# Patient Record
Sex: Female | Born: 1970 | Race: Black or African American | Hispanic: No | Marital: Single | Smoking: Former smoker
Health system: Southern US, Community
[De-identification: ages and names within clinical notes are randomized; demographics above are authoritative.]

## PROBLEM LIST (undated history)

## (undated) DIAGNOSIS — Z87440 Personal history of urinary (tract) infections: Secondary | ICD-10-CM

## (undated) DIAGNOSIS — F419 Anxiety disorder, unspecified: Secondary | ICD-10-CM

## (undated) DIAGNOSIS — D649 Anemia, unspecified: Secondary | ICD-10-CM

## (undated) DIAGNOSIS — E049 Nontoxic goiter, unspecified: Secondary | ICD-10-CM

## (undated) DIAGNOSIS — M549 Dorsalgia, unspecified: Principal | ICD-10-CM

## (undated) DIAGNOSIS — H5440 Blindness, one eye, unspecified eye: Secondary | ICD-10-CM

## (undated) DIAGNOSIS — C49A2 Gastrointestinal stromal tumor of stomach: Secondary | ICD-10-CM

## (undated) DIAGNOSIS — F329 Major depressive disorder, single episode, unspecified: Secondary | ICD-10-CM

## (undated) DIAGNOSIS — Z9289 Personal history of other medical treatment: Secondary | ICD-10-CM

## (undated) DIAGNOSIS — M1990 Unspecified osteoarthritis, unspecified site: Secondary | ICD-10-CM

## (undated) DIAGNOSIS — I10 Essential (primary) hypertension: Secondary | ICD-10-CM

## (undated) DIAGNOSIS — Z973 Presence of spectacles and contact lenses: Secondary | ICD-10-CM

## (undated) DIAGNOSIS — G8929 Other chronic pain: Secondary | ICD-10-CM

## (undated) DIAGNOSIS — C49A4 Gastrointestinal stromal tumor of large intestine: Secondary | ICD-10-CM

## (undated) DIAGNOSIS — F17200 Nicotine dependence, unspecified, uncomplicated: Secondary | ICD-10-CM

## (undated) DIAGNOSIS — F32A Depression, unspecified: Secondary | ICD-10-CM

## (undated) DIAGNOSIS — D259 Leiomyoma of uterus, unspecified: Secondary | ICD-10-CM

## (undated) DIAGNOSIS — M79606 Pain in leg, unspecified: Secondary | ICD-10-CM

## (undated) HISTORY — DX: Pain in leg, unspecified: M79.606

## (undated) HISTORY — DX: Depression, unspecified: F32.A

## (undated) HISTORY — DX: Other chronic pain: G89.29

## (undated) HISTORY — DX: Nicotine dependence, unspecified, uncomplicated: F17.200

## (undated) HISTORY — DX: Dorsalgia, unspecified: M54.9

## (undated) HISTORY — DX: Personal history of other medical treatment: Z92.89

## (undated) HISTORY — PX: MYOMECTOMY: SHX85

## (undated) HISTORY — DX: Major depressive disorder, single episode, unspecified: F32.9

## (undated) HISTORY — PX: DIAGNOSTIC LAPAROSCOPY: SUR761

## (undated) HISTORY — DX: Gastrointestinal stromal tumor of large intestine: C49.A4

## (undated) HISTORY — PX: OTHER SURGICAL HISTORY: SHX169

## (undated) HISTORY — DX: Presence of spectacles and contact lenses: Z97.3

## (undated) HISTORY — DX: Anxiety disorder, unspecified: F41.9

## (undated) HISTORY — PX: TUBAL LIGATION: SHX77

## (undated) HISTORY — DX: Personal history of urinary (tract) infections: Z87.440

## (undated) HISTORY — DX: Leiomyoma of uterus, unspecified: D25.9

## (undated) HISTORY — DX: Anemia, unspecified: D64.9

---
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
## 2004-10-05 HISTORY — PX: HERNIA REPAIR: SHX51

## 2008-10-05 DIAGNOSIS — C49A4 Gastrointestinal stromal tumor of large intestine: Secondary | ICD-10-CM

## 2008-10-05 HISTORY — DX: Gastrointestinal stromal tumor of large intestine: C49.A4

## 2008-10-05 HISTORY — PX: TUMOR REMOVAL: SHX12

## 2010-09-16 ENCOUNTER — Emergency Department (HOSPITAL_COMMUNITY)
Admission: EM | Admit: 2010-09-16 | Discharge: 2010-09-16 | Payer: Self-pay | Source: Home / Self Care | Admitting: Emergency Medicine

## 2010-12-16 LAB — URINALYSIS, ROUTINE W REFLEX MICROSCOPIC
Bilirubin Urine: NEGATIVE
Ketones, ur: NEGATIVE mg/dL
Nitrite: POSITIVE — AB
Protein, ur: 100 mg/dL — AB
Urobilinogen, UA: 2 mg/dL — ABNORMAL HIGH (ref 0.0–1.0)
pH: 7 (ref 5.0–8.0)

## 2011-01-01 ENCOUNTER — Encounter: Payer: Self-pay | Admitting: Oncology

## 2011-01-20 ENCOUNTER — Encounter (HOSPITAL_BASED_OUTPATIENT_CLINIC_OR_DEPARTMENT_OTHER): Payer: Medicaid Other | Admitting: Oncology

## 2011-01-20 ENCOUNTER — Other Ambulatory Visit: Payer: Self-pay | Admitting: Oncology

## 2011-01-20 DIAGNOSIS — C169 Malignant neoplasm of stomach, unspecified: Secondary | ICD-10-CM

## 2011-01-20 DIAGNOSIS — I10 Essential (primary) hypertension: Secondary | ICD-10-CM

## 2011-01-20 DIAGNOSIS — C494 Malignant neoplasm of connective and soft tissue of abdomen: Secondary | ICD-10-CM

## 2011-01-20 DIAGNOSIS — R51 Headache: Secondary | ICD-10-CM

## 2011-01-20 DIAGNOSIS — D649 Anemia, unspecified: Secondary | ICD-10-CM

## 2011-01-20 LAB — CBC WITH DIFFERENTIAL/PLATELET
Basophils Absolute: 0 10*3/uL (ref 0.0–0.1)
Eosinophils Absolute: 0.3 10*3/uL (ref 0.0–0.5)
HCT: 32.6 % — ABNORMAL LOW (ref 34.8–46.6)
HGB: 10.9 g/dL — ABNORMAL LOW (ref 11.6–15.9)
LYMPH%: 32.9 % (ref 14.0–49.7)
MCV: 83.3 fL (ref 79.5–101.0)
MONO#: 0.4 10*3/uL (ref 0.1–0.9)
MONO%: 6.5 % (ref 0.0–14.0)
NEUT#: 3.2 10*3/uL (ref 1.5–6.5)
Platelets: 208 10*3/uL (ref 145–400)
RBC: 3.91 10*6/uL (ref 3.70–5.45)
WBC: 5.9 10*3/uL (ref 3.9–10.3)

## 2011-01-20 LAB — CHCC SMEAR

## 2011-01-21 LAB — COMPREHENSIVE METABOLIC PANEL
ALT: 11 U/L (ref 0–35)
CO2: 23 mEq/L (ref 19–32)
Calcium: 8.6 mg/dL (ref 8.4–10.5)
Chloride: 107 mEq/L (ref 96–112)
Creatinine, Ser: 0.77 mg/dL (ref 0.40–1.20)
Glucose, Bld: 120 mg/dL — ABNORMAL HIGH (ref 70–99)
Sodium: 139 mEq/L (ref 135–145)
Total Bilirubin: 0.4 mg/dL (ref 0.3–1.2)
Total Protein: 6.3 g/dL (ref 6.0–8.3)

## 2011-01-21 LAB — LACTATE DEHYDROGENASE: LDH: 154 U/L (ref 94–250)

## 2011-01-21 LAB — FERRITIN: Ferritin: 6 ng/mL — ABNORMAL LOW (ref 10–291)

## 2011-02-03 ENCOUNTER — Ambulatory Visit (HOSPITAL_COMMUNITY)
Admission: RE | Admit: 2011-02-03 | Discharge: 2011-02-03 | Disposition: A | Discharge status: Home / Self Care | Payer: Self-pay | Source: Ambulatory Visit | Attending: Oncology | Admitting: Oncology

## 2011-02-03 ENCOUNTER — Other Ambulatory Visit: Payer: Self-pay | Admitting: Oncology

## 2011-02-03 DIAGNOSIS — Z9221 Personal history of antineoplastic chemotherapy: Secondary | ICD-10-CM | POA: Insufficient documentation

## 2011-02-03 DIAGNOSIS — C169 Malignant neoplasm of stomach, unspecified: Secondary | ICD-10-CM

## 2011-02-03 DIAGNOSIS — D259 Leiomyoma of uterus, unspecified: Secondary | ICD-10-CM | POA: Insufficient documentation

## 2011-02-03 DIAGNOSIS — E042 Nontoxic multinodular goiter: Secondary | ICD-10-CM | POA: Insufficient documentation

## 2011-02-03 DIAGNOSIS — Z8500 Personal history of malignant neoplasm of unspecified digestive organ: Secondary | ICD-10-CM | POA: Insufficient documentation

## 2011-02-03 MED ORDER — IOHEXOL 300 MG/ML  SOLN
100.0000 mL | Freq: Once | INTRAMUSCULAR | Status: AC | PRN
  Administered 2011-02-03: 100 mL via INTRAVENOUS

## 2011-02-05 ENCOUNTER — Encounter (HOSPITAL_BASED_OUTPATIENT_CLINIC_OR_DEPARTMENT_OTHER): Payer: Self-pay | Admitting: Oncology

## 2011-02-05 DIAGNOSIS — D481 Neoplasm of uncertain behavior of connective and other soft tissue: Secondary | ICD-10-CM

## 2011-02-05 DIAGNOSIS — D509 Iron deficiency anemia, unspecified: Secondary | ICD-10-CM

## 2011-02-12 ENCOUNTER — Encounter: Payer: Self-pay | Admitting: Oncology

## 2011-03-04 ENCOUNTER — Encounter: Payer: Self-pay | Admitting: Obstetrics & Gynecology

## 2011-03-31 ENCOUNTER — Encounter (HOSPITAL_BASED_OUTPATIENT_CLINIC_OR_DEPARTMENT_OTHER): Payer: 59 | Admitting: Oncology

## 2011-03-31 ENCOUNTER — Other Ambulatory Visit: Payer: Self-pay | Admitting: Oncology

## 2011-03-31 DIAGNOSIS — R51 Headache: Secondary | ICD-10-CM

## 2011-03-31 DIAGNOSIS — I10 Essential (primary) hypertension: Secondary | ICD-10-CM

## 2011-03-31 DIAGNOSIS — D649 Anemia, unspecified: Secondary | ICD-10-CM

## 2011-03-31 DIAGNOSIS — C169 Malignant neoplasm of stomach, unspecified: Secondary | ICD-10-CM

## 2011-03-31 LAB — CBC WITH DIFFERENTIAL/PLATELET
BASO%: 0.6 % (ref 0.0–2.0)
EOS%: 1.4 % (ref 0.0–7.0)
LYMPH%: 37.3 % (ref 14.0–49.7)
MCHC: 31.6 g/dL (ref 31.5–36.0)
MCV: 77.2 fL — ABNORMAL LOW (ref 79.5–101.0)
MONO%: 6.9 % (ref 0.0–14.0)
Platelets: 214 10*3/uL (ref 145–400)
RBC: 4.34 10*6/uL (ref 3.70–5.45)
RDW: 16.3 % — ABNORMAL HIGH (ref 11.2–14.5)
nRBC: 0 % (ref 0–0)

## 2011-03-31 LAB — BASIC METABOLIC PANEL
Calcium: 9.3 mg/dL (ref 8.4–10.5)
Creatinine, Ser: 0.79 mg/dL (ref 0.50–1.10)
Sodium: 140 mEq/L (ref 135–145)

## 2011-04-13 ENCOUNTER — Inpatient Hospital Stay (INDEPENDENT_AMBULATORY_CARE_PROVIDER_SITE_OTHER)
Admission: RE | Admit: 2011-04-13 | Discharge: 2011-04-13 | Disposition: A | Discharge status: Home / Self Care | Payer: 59 | Source: Ambulatory Visit | Attending: Family Medicine | Admitting: Family Medicine

## 2011-04-13 DIAGNOSIS — B9689 Other specified bacterial agents as the cause of diseases classified elsewhere: Secondary | ICD-10-CM

## 2011-04-13 DIAGNOSIS — A499 Bacterial infection, unspecified: Secondary | ICD-10-CM

## 2011-04-13 DIAGNOSIS — N760 Acute vaginitis: Secondary | ICD-10-CM

## 2011-04-13 DIAGNOSIS — L738 Other specified follicular disorders: Secondary | ICD-10-CM

## 2011-04-13 LAB — POCT URINALYSIS DIP (DEVICE)
Glucose, UA: NEGATIVE mg/dL
Leukocytes, UA: NEGATIVE
Nitrite: NEGATIVE
pH: 6 (ref 5.0–8.0)

## 2011-04-13 LAB — WET PREP, GENITAL
Trich, Wet Prep: NONE SEEN
Yeast Wet Prep HPF POC: NONE SEEN

## 2011-04-13 LAB — POCT PREGNANCY, URINE: Preg Test, Ur: NEGATIVE

## 2011-04-15 LAB — HERPES SIMPLEX VIRUS CULTURE

## 2012-03-10 ENCOUNTER — Encounter (HOSPITAL_COMMUNITY): Payer: Self-pay | Admitting: Emergency Medicine

## 2012-03-10 ENCOUNTER — Emergency Department (INDEPENDENT_AMBULATORY_CARE_PROVIDER_SITE_OTHER)
Admission: EM | Admit: 2012-03-10 | Discharge: 2012-03-10 | Disposition: A | Discharge status: Nursing Home (Includes ICF) | Payer: 59 | Source: Home / Self Care | Attending: Emergency Medicine | Admitting: Emergency Medicine

## 2012-03-10 ENCOUNTER — Encounter (HOSPITAL_COMMUNITY): Payer: Self-pay | Admitting: *Deleted

## 2012-03-10 ENCOUNTER — Emergency Department (HOSPITAL_COMMUNITY)
Admission: EM | Admit: 2012-03-10 | Discharge: 2012-03-10 | Disposition: A | Discharge status: Home / Self Care | Payer: Self-pay | Attending: Emergency Medicine | Admitting: Emergency Medicine

## 2012-03-10 DIAGNOSIS — I10 Essential (primary) hypertension: Secondary | ICD-10-CM | POA: Insufficient documentation

## 2012-03-10 DIAGNOSIS — F17200 Nicotine dependence, unspecified, uncomplicated: Secondary | ICD-10-CM | POA: Insufficient documentation

## 2012-03-10 DIAGNOSIS — I160 Hypertensive urgency: Secondary | ICD-10-CM

## 2012-03-10 HISTORY — DX: Blindness, one eye, unspecified eye: H54.40

## 2012-03-10 HISTORY — DX: Essential (primary) hypertension: I10

## 2012-03-10 HISTORY — DX: Gastrointestinal stromal tumor of stomach: C49.A2

## 2012-03-10 MED ORDER — LISINOPRIL-HYDROCHLOROTHIAZIDE 20-12.5 MG PO TABS: 1.0000 | ORAL_TABLET | Freq: Every day | ORAL | Status: DC

## 2012-03-10 MED ORDER — LISINOPRIL 10 MG PO TABS: 10.0000 mg | ORAL_TABLET | Freq: Once | ORAL | Status: DC

## 2012-03-10 MED ORDER — HYDROCHLOROTHIAZIDE 25 MG PO TABS
25.0000 mg | ORAL_TABLET | Freq: Once | ORAL | Status: AC
  Administered 2012-03-10: 25 mg via ORAL
  Filled 2012-03-10: qty 1

## 2012-03-10 MED ORDER — LISINOPRIL 20 MG PO TABS
20.0000 mg | ORAL_TABLET | Freq: Once | ORAL | Status: AC
  Administered 2012-03-10: 20 mg via ORAL
  Filled 2012-03-10: qty 1

## 2012-03-10 NOTE — Discharge Instructions (Signed)
Hypertension Information As your heart beats, it forces blood through your arteries. This force is your blood pressure. If the pressure is too high, it is called hypertension (HTN) or high blood pressure. HTN is dangerous because you may have it and not know it. High blood pressure may mean that your heart has to work harder to pump blood. Your arteries may be narrow or stiff. The extra work puts you at risk for heart disease, stroke, and other problems.  Blood pressure consists of two numbers, a higher number over a lower, 110/72, for example. It is stated as "110 over 72." The ideal is below 120 for the top number (systolic) and under 80 for the bottom (diastolic).  You should pay close attention to your blood pressure if you have certain conditions such as:  Heart failure.   Prior heart attack.   Diabetes   Chronic kidney disease.   Prior stroke.   Multiple risk factors for heart disease.  To see if you have HTN, your blood pressure should be measured while you are seated with your arm held at the level of the heart. It should be measured at least twice. A one-time elevated blood pressure reading (especially in the Emergency Department) does not mean that you need treatment. There may be conditions in which the blood pressure is different between your right and left arms. It is important to see your caregiver soon for a recheck. Most people have essential hypertension which means that there is not a specific cause. This type of high blood pressure may be lowered by changing lifestyle factors such as:  Stress.   Smoking.   Lack of exercise.   Excessive weight.   Drug/tobacco/alcohol use.   Eating less salt.  Most people do not have symptoms from high blood pressure until it has caused damage to the body. Effective treatment can often prevent, delay or reduce that damage. TREATMENT  Treatment for high blood pressure, when a cause has been identified, is directed at the cause. There  are a large number of medications to treat HTN. These fall into several categories, and your caregiver will help you select the medicines that are best for you. Medications may have side effects. You should review side effects with your caregiver. If your blood pressure stays high after you have made lifestyle changes or started on medicines,   Your medication(s) may need to be changed.   Other problems may need to be addressed.   Be certain you understand your prescriptions, and know how and when to take your medicine.   Be sure to follow up with your caregiver within the time frame advised (usually within two weeks) to have your blood pressure rechecked and to review your medications.   If you are taking more than one medicine to lower your blood pressure, make sure you know how and at what times they should be taken. Taking two medicines at the same time can result in blood pressure that is too low.  Document Released: 11/24/2005 Document Revised: 06/03/2011 Document Reviewed: 12/01/2007 [facility] Patient Information 2012 [place].

## 2012-03-10 NOTE — ED Notes (Signed)
Pharmacy notified re: meds

## 2012-03-10 NOTE — ED Notes (Signed)
Pt sent here from Urgent Care for elevated bp (174/125).  Pt states she went to Urgent Care for a persistent headache and nausea.  Pt was dx with hypertension since 2003.  She does not have a pcp and she ran out of her refills 1 month prior (she had been given an RX at urgent care).  Presently pt c/o a pressure headadche (frontal).

## 2012-03-10 NOTE — ED Provider Notes (Signed)
History     CSN: [id number]  Arrival date & time 03/10/12  1903   First MD Initiated Contact with Patient 03/10/12 1905      Chief Complaint  Patient presents with  . Hypertension    (Consider location/radiation/quality/duration/timing/severity/associated sxs/prior treatment) HPI Comments: Patient with a history of hypertension, has not been on blood pressure medications in 4 months reports severe, acute onset headache with blurry vision, nausea, vomiting lasting for approximately 12 hours yesterday while at rest.  The blurry vision was primarily in her right eye. 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 There were no aggravating factors when having her symptoms yesterday. Her headache and blurry vision have since resolved after sleeping. No photophobia, neck stiffness. No dysarthria, facial droop, focal weakness. No syncope, or seizures. No chest pain, palpitations, shortness of breath, abdominal pain. No anuria or hematuria. No lower extremity swelling. Denies illicit drug use, decongestions, excessive caffeine. She was on hydrochlorothiazide once daily, which she states did not work.  ROS as noted in HPI. All other ROS negative.   Patient is a 41 y.o. female presenting with hypertension and headaches. The history is provided by the patient. No language interpreter was used.  Hypertension This is a chronic problem. The current episode started more than 1 week ago. The problem occurs constantly. The problem has not changed since onset.Associated symptoms include headaches. Pertinent negatives include no chest pain, no abdominal pain and no shortness of breath. The symptoms are aggravated by nothing. The symptoms are relieved by nothing. She has tried nothing for the symptoms. The treatment provided no relief.  Headache The primary symptoms include headaches and  visual change. Primary symptoms do not include syncope, loss of consciousness, seizures, focal weakness, loss of sensation, speech change, fever, nausea or vomiting. The symptoms began yesterday. The symptoms are resolved. The neurological symptoms are diffuse.  The headache is associated with visual change. The headache is not associated with photophobia, neck stiffness or loss of balance.  The visual change does not include photophobia.  Additional symptoms do not include neck stiffness, loss of balance or photophobia. Medical issues also include cancer and hypertension. Medical issues do not include cerebral vascular accident, drug use or diabetes.    Past Medical History  Diagnosis Date  . Hypertension   . Blindness of right eye     congenital  . 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 Tumor removal   . Tubal ligation     was reversed    History reviewed. No pertinent family history.  History  Substance Use Topics  . Smoking status: Current Everyday Smoker  . Smokeless tobacco: Not on file  . Alcohol Use: Yes    OB History    Grav Para Term Preterm Abortions TAB SAB Ect Mult Living                  Review of Systems  Constitutional: Negative for fever.  HENT: Negative for neck stiffness.   Eyes: Negative for photophobia.  Respiratory: Negative for shortness of breath.   Cardiovascular: Negative for chest pain and syncope.  Gastrointestinal: Negative for nausea, vomiting and abdominal pain.  Neurological: Positive for headaches. Negative for speech change, focal weakness, seizures, loss of consciousness and loss  of balance.    Allergies  Review of patient's allergies indicates no known allergies.  Home Medications   Current Outpatient Rx  Name Route Sig Dispense Refill  . HYDROCHLOROTHIAZIDE 25 MG PO TABS Oral Take 25 mg by mouth daily.      BP 186/127  Pulse 70  Temp(Src) 99.2 F (37.3  C) (Oral)  Resp 20  SpO2 100%  LMP 02/10/2012  Physical Exam  Nursing note and vitals reviewed. Constitutional: She is oriented to person, place, and time. She appears well-developed and well-nourished. She appears distressed.       Appears uncomfortable. Photophobic. Lying down with hat pulled over eyes .  HENT:  Head: Normocephalic and atraumatic.  Right Ear: Tympanic membrane normal.  Left Ear: Tympanic membrane normal.  Nose: Nose normal. Right sinus exhibits no maxillary sinus tenderness and no frontal sinus tenderness. Left sinus exhibits no maxillary sinus tenderness and no frontal sinus tenderness.  Mouth/Throat: Uvula is midline, oropharynx is clear and moist and mucous membranes are normal.  Eyes: Conjunctivae and EOM are normal. Pupils are equal, round, and reactive to light.  Fundoscopic exam:      The right eye shows no hemorrhage.       The left eye shows no hemorrhage.       Nondilated  funduscopic exam limited  Neck: Normal range of motion. Neck supple. Normal range of motion present.  Cardiovascular: Normal rate, regular rhythm, normal heart sounds and intact distal pulses.   Pulmonary/Chest: Effort normal and breath sounds normal.  Abdominal: Soft. Bowel sounds are normal. She exhibits no distension.  Musculoskeletal: Normal range of motion. She exhibits no edema.  Lymphadenopathy:    She has no cervical adenopathy.  Neurological: She is alert and oriented to person, place, and time. She has normal strength. She displays no tremor. No cranial nerve deficit or sensory deficit. Coordination and gait normal.  Skin: Skin is warm and dry.  Psychiatric: She has a normal mood and affect. Her behavior is normal. Judgment and thought content normal.    ED Course  Procedures (including critical care time)  Labs Reviewed - No data to display No results found.   1. Hypertensive urgency    EKG: Sinus bradycardia, rate 59. Normal axis, normal intervals. LVH. No previous  EKG for comparison.   MDM   She is neurologically intact here, and currently has no complaints. EKG is unremarkable. Blood pressure is 186/127. Concern for hypertensive urgency given her symptoms yesterday. Transferring the ED for further evaluation.  Cherly Beach, MD 03/10/12 2116

## 2012-03-10 NOTE — ED Notes (Addendum)
PT HERE WITH C/O FRONTAL THRO HA/ RADIATING TO POSTERIOR WITH DIZZINESS AND X 1 EPISODE OF VOMITING.HX HTN AND HAS NOT TAKEN MEDS X 4 MNTHS AGO.TRIED ALEVE BUT NO RELIEF.BP 180'S/127.SX STARTED YESTERDAY

## 2012-03-10 NOTE — ED Provider Notes (Signed)
History     CSN: [id number]  Arrival date & time 03/10/12  2103   First MD Initiated Contact with Patient 03/10/12 2211      No chief complaint on file.   (Consider location/radiation/quality/duration/timing/severity/associated sxs/prior treatment) HPI Comments: Patient presents with history of hypertension.  She had headache yesterday and just felt tired.  Today when she still had some mild headache she went to urgent care where they evaluated her and sent her here for further evaluation for hypertension.  Patient notes that she has been off of her medications for many months.  She does not have a primary care physician currently.  She notes that she'll his mild headache at this time in no nausea, vomiting, chest pain, shortness of breath, abdominal pain or other complaints.  No weakness, numbness or other vision changes.  Patient had one episode of nausea and vomiting yesterday which has since resolved.    The history is provided by the patient. No language interpreter was used.    Past Medical History  Diagnosis Date  . Hypertension   . 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 Blindness of left eye     congenital    Past Surgical History  Procedure Date  . Tumor removal 2010    stromo tumor -  half of stomach removed  . Tubal ligation     was reversed    History reviewed. No pertinent family history.  History  Substance Use Topics  . Smoking status: Current Everyday Smoker -- 1.0 packs/day    Types: Cigarettes  . Smokeless tobacco: Not on file  . Alcohol Use: Yes    OB History    Grav Para Term Preterm Abortions TAB SAB Ect Mult Living                  Review of Systems  Constitutional: Negative.  Negative for fever and chills.  Eyes: Negative.  Negative for discharge and redness.  Respiratory: Negative.  Negative for cough and shortness of breath.   Cardiovascular: Negative.  Negative for chest pain.  Gastrointestinal: Positive for  nausea and vomiting. Negative for abdominal pain and diarrhea.  Genitourinary: Negative.   Musculoskeletal: Negative.  Negative for back pain.  Skin: Negative.  Negative for color change and rash.  Neurological: Positive for headaches. Negative for syncope.  Hematological: Negative.  Negative for adenopathy.  Psychiatric/Behavioral: Negative.  Negative for confusion.  All other systems reviewed and are negative.    Allergies  Review of patient's allergies indicates no known allergies.  Home Medications  No current outpatient prescriptions on file.  BP 150/112  Pulse 55  Temp(Src) 98.2 F (36.8 C) (Oral)  Resp 18  SpO2 100%  LMP 02/10/2012  Physical Exam  Nursing note and vitals reviewed. Constitutional: She is oriented to person, place, and time. She appears well-developed and well-nourished.  Non-toxic appearance. She does not have a sickly appearance.  HENT:  Head: Normocephalic and atraumatic.  Eyes: Conjunctivae, EOM and lids are normal. No scleral icterus.  Neck: Trachea normal and normal range of motion. Neck supple.  Cardiovascular: Normal rate, regular rhythm and normal heart sounds.   Pulmonary/Chest: Effort normal and breath sounds normal. No respiratory distress. She has no wheezes. She has no rales.  Abdominal: Soft. Normal appearance. There is no tenderness. There is no rebound, no guarding and no CVA tenderness.  Musculoskeletal: Normal range of motion.  Neurological: She is alert and oriented  to person, place, and time. She has normal strength.  Skin: Skin is warm, dry and intact. No rash noted.  Psychiatric: She has a normal mood and affect. Her behavior is normal. Judgment and thought content normal.    ED Course  Procedures (including critical care time)  Labs Reviewed - No data to display No results found.   No diagnosis found.    MDM  Patient with prior diagnosis of hypertension not on   any medications for the last several months.  Patient  does not have medical insurance so she has not been seen her primary care physician.  At this point in time patient has no acute neurologic deficits.  Her headache is mild at this time.  She has no chest pain, shortness of breath or clinical signs of decreased urination.  Patient needs to be restarted on blood pressure medications and I will start her on HCTZ and lisinopril.  She has had a tubal ligation so pregnancy is not a concern at this time with the ACE inhibitor.  I've advised her that she will need followup in 3-4 weeks for blood pressure recheck.  We'll give her the resource guide to help her accomplish this.       Lezlie Octave, MD 03/10/12 2224

## 2012-03-10 NOTE — ED Notes (Signed)
Called for pt with no response.

## 2012-06-27 ENCOUNTER — Emergency Department (HOSPITAL_COMMUNITY)
Admission: EM | Admit: 2012-06-27 | Discharge: 2012-06-28 | Disposition: A | Discharge status: Home / Self Care | Payer: Self-pay | Attending: Emergency Medicine | Admitting: Emergency Medicine

## 2012-06-27 ENCOUNTER — Encounter (HOSPITAL_COMMUNITY): Payer: Self-pay | Admitting: Emergency Medicine

## 2012-06-27 DIAGNOSIS — B86 Scabies: Secondary | ICD-10-CM

## 2012-06-27 DIAGNOSIS — R21 Rash and other nonspecific skin eruption: Secondary | ICD-10-CM | POA: Insufficient documentation

## 2012-06-27 DIAGNOSIS — F17200 Nicotine dependence, unspecified, uncomplicated: Secondary | ICD-10-CM | POA: Insufficient documentation

## 2012-06-27 DIAGNOSIS — I10 Essential (primary) hypertension: Secondary | ICD-10-CM | POA: Insufficient documentation

## 2012-06-27 NOTE — ED Provider Notes (Signed)
History  This chart was scribed for No att. providers found by [facility]. This patient was seen in room TR04C/TR04C and the patient's care was started at 23:30.  CSN: [id number]  Arrival date & time 06/27/12  2238   None     Chief Complaint  Patient presents with  . Rash     The history is provided by the patient. No language interpreter was used.    Sydney [name] is a 41 y.o. female who presents to the Emergency Department complaining of a rash on the arms, back of neck and lower abdomen. She states it feels like something is biting her, with the rash coming and going. She states that the rash started last week, but denies new detergent, food or medications. She also denies fever, chills, nausea and emesis. She states that her boyfriend has similar complaints. She states that it is not bed bugs, that she has checked her bed and washed all sheets and blankets. She has a h/o HTN and stromal tumor in the stomach. She is a current everyday smoker and occasional alcohol user.   Past Medical History  Diagnosis Date  . Hypertension   . 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 Blindness of left eye     congenital    Past Surgical History  Procedure Date  . Tumor removal 2010    stromo tumor -  half of stomach removed  . Tubal ligation     was reversed    History reviewed. No pertinent family history.  History  Substance Use Topics  . Smoking status: Current Every Day Smoker -- 1.0 packs/day    Types: Cigarettes  . Smokeless tobacco: Not on file  . Alcohol Use: Yes   No OB history available.    Review of Systems  A complete 10 system review of systems was obtained and all systems are negative except as noted in the HPI and PMH.    Allergies  Review of patient's allergies indicates no known allergies.  Home Medications   Current Outpatient Rx  Name Route Sig Dispense Refill  . VALSARTAN 160 MG PO TABS Oral Take 160 mg by mouth  daily.      Triage Vitals: BP 138/97  Pulse 81  Temp 98.1 F (36.7 C) (Oral)  Resp 19  SpO2 98%  LMP 06/04/2012  Physical Exam  Nursing note and vitals reviewed. Constitutional: She is oriented to person, place, and time. She appears well-developed and well-nourished. No distress.  HENT:  Head: Normocephalic and atraumatic.  Eyes: EOM are normal.  Neck: Neck supple. No tracheal deviation present.  Cardiovascular: Normal rate, regular rhythm and normal heart sounds.   Pulmonary/Chest: Effort normal and breath sounds normal. No respiratory distress.  Musculoskeletal: Normal range of motion.  Neurological: She is alert and oriented to person, place, and time.  Skin: Skin is warm and dry. Rash noted.       Tiny papular lesions, erythematous, located in the web space on the left hand, on the extensor surface of the wrist on RUE and LUE.   Psychiatric: She has a normal mood and affect. Her behavior is normal.    ED Course  Procedures (including critical care time) DIAGNOSTIC STUDIES: Oxygen Saturation is 98% on room air, normal by my interpretation.    COORDINATION OF CARE: 2350-Discussed treatment plan with pt at bedside and pt agreed to plan.    Labs Reviewed - No data to  display No results found.   No diagnosis found.    MDM  Medical screening examination/treatment/procedure(s) were performed by me as the supervising physician. Scribe service was utilized for documentation only.  Pt comes in with cc of rash. Pt has some tiny macular raised lesion, diffuse - but they are present over the web space, and over the joint space. Clinical concern is for scabies - although i recognize that pt is not the typical age group for scabies. Will give the meds, and d.c.      Varney Biles, MD 06/28/12 [phone]

## 2012-06-27 NOTE — ED Notes (Signed)
Patient says she has been itching around her wrists, her belly button, elbows and under her arms.  She says she has noticed "black dots" in the places where she is itching.  Patient has had the symptoms about a week and a half.  Patient says her boyfriend has been complaining of the same symptoms for about a month.  Patient thought it might be bed bugs, but then she did more research and found out she had symptoms of scabies.

## 2012-06-27 NOTE — ED Notes (Signed)
PT. REPORTS ITCHY RASHES AT ARMS , BACK OF NECK AND LOWER ABDOMEN FOR 2 WEEKS , RESPIRATIONS UNLABORED , AIRWAY PATENT , DENIES NEW DETERGENT , NEW SOAP , NEW FOOD OR MEDICATIONS .

## 2012-06-28 MED ORDER — PERMETHRIN 5 % EX CREA: TOPICAL_CREAM | Freq: Once | CUTANEOUS | Status: DC

## 2012-06-28 MED ORDER — DIPHENHYDRAMINE HCL 25 MG PO CAPS: 25.0000 mg | ORAL_CAPSULE | Freq: Four times a day (QID) | ORAL | Status: DC | PRN

## 2012-06-28 NOTE — ED Notes (Signed)
Patient is alert and orientedx4.  Patient was explained discharge instructions and she had no questions.  Patient is driving herself home.

## 2012-07-06 ENCOUNTER — Encounter (HOSPITAL_COMMUNITY): Payer: Self-pay

## 2012-07-06 ENCOUNTER — Emergency Department (INDEPENDENT_AMBULATORY_CARE_PROVIDER_SITE_OTHER)
Admission: EM | Admit: 2012-07-06 | Discharge: 2012-07-06 | Disposition: A | Discharge status: Home / Self Care | Payer: Self-pay | Source: Home / Self Care

## 2012-07-06 DIAGNOSIS — R21 Rash and other nonspecific skin eruption: Secondary | ICD-10-CM

## 2012-07-06 MED ORDER — CETIRIZINE HCL 10 MG PO TABS: 10.0000 mg | ORAL_TABLET | Freq: Every day | ORAL | Status: DC

## 2012-07-06 MED ORDER — TRIAMCINOLONE ACETONIDE 0.1 % EX CREA: TOPICAL_CREAM | Freq: Two times a day (BID) | CUTANEOUS | Status: DC

## 2012-07-06 NOTE — ED Provider Notes (Signed)
History     CSN: [id number]  Arrival date & time 07/06/12  1742   None     Chief Complaint  Patient presents with  . Rash    (Consider location/radiation/quality/duration/timing/severity/associated sxs/prior treatment) Patient is a 41 y.o. female presenting with rash. The history is provided by the patient.  Rash   This patient complains of a pruritic rash.  Location: bilateral arms and left leg  Onset: 1-2 wk ago   Course: improved Self-treated with: elimite cream prescribed by [facility] provider         Improvement with treatment: some  History Itching: yes  Tenderness: no  New medications/antibiotics: no  Pet exposure: no  Recent travel or tropical exposure: no  New soaps, shampoos, detergent, clothing: no Tick/insect exposure: no   Red Flags Feeling ill: no Fever:no Facial/tongue swelling/difficulty breathing:  no  Diabetic or immunocompromised: no   Past Medical History  Diagnosis Date  . Hypertension   . 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 Blindness of left eye     congenital    Past Surgical History  Procedure Date  . Tumor removal 2010    stromo tumor -  half of stomach removed  . Tubal ligation     was reversed    No family history on file.  History  Substance Use Topics  . Smoking status: Current Every Day Smoker -- 1.0 packs/day    Types: Cigarettes  . Smokeless tobacco: Not on file  . Alcohol Use: Yes    OB History    Grav Para Term Preterm Abortions TAB SAB Ect Mult Living                  Review of Systems  Skin: Positive for rash.  All other systems reviewed and are negative.    Allergies  Review of patient's allergies indicates no known allergies.  Home Medications   Current Outpatient Rx  Name Route Sig Dispense Refill  . VALSARTAN 160 MG PO TABS Oral Take 160 mg by mouth daily.    Marland Kitchen CETIRIZINE HCL 10 MG PO TABS Oral Take 1 tablet (10 mg total) by mouth daily. 30 tablet 2  .  DIPHENHYDRAMINE HCL 25 MG PO CAPS Oral Take 1 capsule (25 mg total) by mouth every 6 (six) hours as needed for itching. 30 capsule 0  . TRIAMCINOLONE ACETONIDE 0.1 % EX CREA Topical Apply topically 2 (two) times daily. 30 g 2    BP 130/85  Pulse 55  Temp 98.4 F (36.9 C) (Oral)  Resp 16  SpO2 100%  LMP 06/04/2012  Physical Exam  Nursing note and vitals reviewed. Constitutional: She is oriented to person, place, and time. Vital signs are normal. She appears well-developed and well-nourished. She is active and cooperative.  HENT:  Head: Normocephalic.  Eyes: Conjunctivae normal are normal. Pupils are equal, round, and reactive to light. No scleral icterus.  Neck: Trachea normal. Neck supple.  Cardiovascular: Normal rate and regular rhythm.   Pulmonary/Chest: Effort normal and breath sounds normal.  Neurological: She is alert and oriented to person, place, and time. No cranial nerve deficit or sensory deficit.  Skin: Skin is warm and dry.     Psychiatric: She has a normal mood and affect. Her speech is normal and behavior is normal. Judgment and thought content normal. Cognition and memory are normal.    ED Course  Procedures (including critical care time)  Labs Reviewed -  No data to display No results found.   1. Rash and nonspecific skin eruption       MDM  Cool showers; avoid heat, sunlight and anything that makes condition worse.  Continue Zyrtec for at least seven days.  Begin triamcinolone-follow instructions.  RTC if symptoms do not improve or begin to have problems swallowing, breathing or significant change in condition.  Establish care with primary care provider for evaluation and treatment of your blood pressure.          Awilda Metro, NP 07/08/12 1944

## 2012-07-06 NOTE — ED Notes (Signed)
Patient states that she has a rash that comes and goes, all over her body, states that she also has been taking her dads Diovan to control her BP

## 2012-07-11 NOTE — ED Provider Notes (Signed)
Medical screening examination/treatment/procedure(s) were performed by resident physician or non-physician practitioner and as supervising physician I was immediately available for consultation/collaboration.   Pauline Good MD.    Billy Fischer, MD 07/11/12 2056

## 2013-04-24 ENCOUNTER — Encounter (HOSPITAL_COMMUNITY): Payer: Self-pay | Admitting: *Deleted

## 2013-04-24 ENCOUNTER — Emergency Department (HOSPITAL_COMMUNITY)
Admission: EM | Admit: 2013-04-24 | Discharge: 2013-04-25 | Disposition: A | Discharge status: Home / Self Care | Payer: BC Managed Care – PPO | Attending: Emergency Medicine | Admitting: Emergency Medicine

## 2013-04-24 DIAGNOSIS — Z3202 Encounter for pregnancy test, result negative: Secondary | ICD-10-CM | POA: Insufficient documentation

## 2013-04-24 DIAGNOSIS — F17200 Nicotine dependence, unspecified, uncomplicated: Secondary | ICD-10-CM | POA: Insufficient documentation

## 2013-04-24 DIAGNOSIS — R109 Unspecified abdominal pain: Secondary | ICD-10-CM

## 2013-04-24 DIAGNOSIS — Z8719 Personal history of other diseases of the digestive system: Secondary | ICD-10-CM | POA: Insufficient documentation

## 2013-04-24 DIAGNOSIS — Z79899 Other long term (current) drug therapy: Secondary | ICD-10-CM | POA: Insufficient documentation

## 2013-04-24 DIAGNOSIS — H5440 Blindness, one eye, unspecified eye: Secondary | ICD-10-CM | POA: Insufficient documentation

## 2013-04-24 DIAGNOSIS — Z9889 Other specified postprocedural states: Secondary | ICD-10-CM | POA: Insufficient documentation

## 2013-04-24 DIAGNOSIS — K5900 Constipation, unspecified: Secondary | ICD-10-CM

## 2013-04-24 DIAGNOSIS — I10 Essential (primary) hypertension: Secondary | ICD-10-CM | POA: Insufficient documentation

## 2013-04-24 DIAGNOSIS — R195 Other fecal abnormalities: Secondary | ICD-10-CM

## 2013-04-24 DIAGNOSIS — R110 Nausea: Secondary | ICD-10-CM | POA: Insufficient documentation

## 2013-04-24 LAB — CBC WITH DIFFERENTIAL/PLATELET
Basophils Absolute: 0 10*3/uL (ref 0.0–0.1)
Eosinophils Relative: 2 % (ref 0–5)
Lymphocytes Relative: 37 % (ref 12–46)
MCV: 81.5 fL (ref 78.0–100.0)
Neutrophils Relative %: 54 % (ref 43–77)
Platelets: 256 10*3/uL (ref 150–400)
RDW: 16.4 % — ABNORMAL HIGH (ref 11.5–15.5)
WBC: 6.2 10*3/uL (ref 4.0–10.5)

## 2013-04-24 LAB — URINE MICROSCOPIC-ADD ON

## 2013-04-24 LAB — COMPREHENSIVE METABOLIC PANEL
ALT: 15 U/L (ref 0–35)
AST: 27 U/L (ref 0–37)
CO2: 28 mEq/L (ref 19–32)
Calcium: 9 mg/dL (ref 8.4–10.5)
GFR calc non Af Amer: 81 mL/min — ABNORMAL LOW (ref >90)
Sodium: 138 mEq/L (ref 135–145)
Total Protein: 7.1 g/dL (ref 6.0–8.3)

## 2013-04-24 LAB — URINALYSIS, ROUTINE W REFLEX MICROSCOPIC
Glucose, UA: NEGATIVE mg/dL
Hgb urine dipstick: NEGATIVE
Specific Gravity, Urine: 1.017 (ref 1.005–1.030)

## 2013-04-24 NOTE — ED Notes (Signed)
The pt has had abd pain with bloating nv constipation weakness weight loss no appetite all for 3 months lmp last week.  In 2010 she had some type problem in [place] and she was given chemo pills for awhile but she has not been seen since 2010

## 2013-04-24 NOTE — ED Notes (Signed)
Nurse First Rounds : Nurse explained delay and process to pt. Sitting at waiting area with no distress / respirations unlabored.

## 2013-04-25 ENCOUNTER — Emergency Department (HOSPITAL_COMMUNITY): Payer: BC Managed Care – PPO

## 2013-04-25 ENCOUNTER — Encounter (HOSPITAL_COMMUNITY): Payer: Self-pay | Admitting: Radiology

## 2013-04-25 LAB — OCCULT BLOOD, POC DEVICE: Fecal Occult Bld: POSITIVE — AB

## 2013-04-25 MED ORDER — ACETAMINOPHEN 325 MG PO TABS
650.0000 mg | ORAL_TABLET | Freq: Once | ORAL | Status: AC
  Administered 2013-04-25: 650 mg via ORAL
  Filled 2013-04-25: qty 2

## 2013-04-25 MED ORDER — IOHEXOL 300 MG/ML  SOLN
25.0000 mL | INTRAMUSCULAR | Status: AC
  Administered 2013-04-25: 25 mL via ORAL

## 2013-04-25 MED ORDER — PEG 3350-KCL-NABCB-NACL-NASULF 236 G PO SOLR: 4.0000 L | Freq: Once | ORAL | Status: DC

## 2013-04-25 MED ORDER — IOHEXOL 300 MG/ML  SOLN
100.0000 mL | Freq: Once | INTRAMUSCULAR | Status: AC | PRN
  Administered 2013-04-25: 100 mL via INTRAVENOUS

## 2013-04-25 MED ORDER — VALSARTAN 160 MG PO TABS: 160.0000 mg | ORAL_TABLET | Freq: Every day | ORAL | Status: DC

## 2013-04-25 NOTE — Discharge Instructions (Signed)
Your stool that showed traces of blood in it. Please make an appointment with the gastroenterologist to evaluate that further. Please make an appointment with the oncologist to get reestablished for followup.  Constipation, Adult Constipation is when a person has fewer than 3 bowel movements a week; has difficulty having a bowel movement; or has stools that are dry, hard, or larger than normal. As people grow older, constipation is more common. If you try to fix constipation with medicines that make you have a bowel movement (laxatives), the problem may get worse. Long-term laxative use may cause the muscles of the colon to become weak. A low-fiber diet, not taking in enough fluids, and taking certain medicines may make constipation worse. CAUSES   Certain medicines, such as antidepressants, pain medicine, iron supplements, antacids, and water pills.   Certain diseases, such as diabetes, irritable bowel syndrome (IBS), thyroid disease, or depression.   Not drinking enough water.   Not eating enough fiber-rich foods.   Stress or travel.  Lack of physical activity or exercise.  Not going to the restroom when there is the urge to have a bowel movement.  Ignoring the urge to have a bowel movement.  Using laxatives too much. SYMPTOMS   Having fewer than 3 bowel movements a week.   Straining to have a bowel movement.   Having hard, dry, or larger than normal stools.   Feeling full or bloated.   Pain in the lower abdomen.  Not feeling relief after having a bowel movement. DIAGNOSIS  Your caregiver will take a medical history and perform a physical exam. Further testing may be done for severe constipation. Some tests may include:   A barium enema X-ray to examine your rectum, colon, and sometimes, your small intestine.  A sigmoidoscopy to examine your lower colon.  A colonoscopy to examine your entire colon. TREATMENT  Treatment will depend on the severity of your  constipation and what is causing it. Some dietary treatments include drinking more fluids and eating more fiber-rich foods. Lifestyle treatments may include regular exercise. If these diet and lifestyle recommendations do not help, your caregiver may recommend taking over-the-counter laxative medicines to help you have bowel movements. Prescription medicines may be prescribed if over-the-counter medicines do not work.  HOME CARE INSTRUCTIONS   Increase dietary fiber in your diet, such as fruits, vegetables, whole grains, and beans. Limit high-fat and processed sugars in your diet, such as [place] fries, hamburgers, cookies, candies, and soda.   A fiber supplement may be added to your diet if you cannot get enough fiber from foods.   Drink enough fluids to keep your urine clear or pale yellow.   Exercise regularly or as directed by your caregiver.   Go to the restroom when you have the urge to go. Do not hold it.  Only take medicines as directed by your caregiver. Do not take other medicines for constipation without talking to your caregiver first. [place] IF:   You have bright red blood in your stool.   Your constipation lasts for more than 4 days or gets worse.   You have abdominal or rectal pain.   You have thin, pencil-like stools.  You have unexplained weight loss. MAKE SURE YOU:   Understand these instructions.  Will watch your condition.  Will get help right away if you are not doing well or get worse. Document Released: 06/19/2004 Document Revised: 12/14/2011 Document Reviewed: 08/25/2011 [facility] Patient Information 2014 [place], [place].  Polyethylene Glycol;  Electrolytes oral solution What is this medicine? POLYETHYLENE GLYCOL; ELECTROLYTES (pol ee ETH i leen GLYE col; i LEK truh lahyts) is a laxative. It is used to clean out the bowel before a colonoscopy. This medicine may be used for other purposes; ask your health care provider or  pharmacist if you have questions. What should I tell my health care provider before I take this medicine? They need to know if you have any of these conditions: -any chronic disease of the intestine, stomach, or throat -bloating or pain in stomach area -difficulty swallowing -G6PD deficiency -heart disease -kidney disease -low levels of sodium in the blood -phenylketonuria -seizures -an unusual or allergic reaction to polyethylene glycol, other medicines, dyes, or preservatives -pregnant or trying to get pregnant -breast-feeding How should I use this medicine? Take this medicine by mouth. Before using this medicine you or your pharmacist must fill the container with the amount of water indicated on the package label. Chill solution before using to improve taste. Shake well before each dose. Your doctor will tell you what time to start this medicine. Take exactly as directed. You will usually have the first bowel movement about 1 hour after you begin drinking the solution. After that, you will have frequent watery bowel movements. You will need to follow a special diet before your procedure. Talk to your doctor. Do not eat any solid foods for 3 to 4 hours before taking this medicine. A special MedGuide will be given to you by the pharmacist with each prescription and refill. Be sure to read this information carefully each time. Talk to your pediatrician regarding the use of this medicine in children. Special care may be needed. Overdosage: If you think you have taken too much of this medicine contact a poison control center or emergency room at once. NOTE: This medicine is only for you. Do not share this medicine with others. What if I miss a dose? You should talk to your doctor if you are not able to complete the bowel preparation as advised. What may interact with this medicine? Do not take any other medicine by mouth starting one hour before you take this medicine. Talk to your doctor  about when to take your other medicines. This medicine may interact with the following medications: -certain medicines for blood pressure, heart disease, irregular heart beat -certain medicines for kidney disease -certain medicines for seizures like carbamazepine, phenobarbital, phenytoin -diuretics -laxatives -NSAIDS, medicines for pain and inflammation, like ibuprofen or naproxen This list may not describe all possible interactions. Give your health care provider a list of all the medicines, herbs, non-prescription drugs, or dietary supplements you use. Also tell them if you smoke, drink alcohol, or use illegal drugs. Some items may interact with your medicine. What should I watch for while using this medicine? Tell your doctor if you experience any changes in bowel habits that are severe or last longer than three weeks. Do not inhale dust from the solution powder. This can make breathing difficult or may cause sneezing or an allergic-type reaction. Bloating may occur before the first bowel movement. If your discomfort continues while you are drinking the solution, stop drinking the solution temporarily or drink each portion with longer spaces in between until your symptoms disappear. Contact your doctor or health care professional if you have any concerns. What side effects may I notice from receiving this medicine? Side effects that you should report to your doctor or health care professional as soon as possible: -allergic reactions like  skin rash, itching or hives, swelling of the face, lips, or tongue -breathing problems -chest tightness -dizziness -fast, irregular heartbeat -headache -seizures -trouble passing urine or change in the amount of urine -vomiting Side effects that usually do not require medical attention (report to your doctor or health care professional if they continue or are bothersome): -anal irritation -bloating, pain, or distension of the  stomach -chills -increased hunger or thirst -nausea -stomach gas -tiredness -trouble sleeping This list may not describe all possible side effects. Call your doctor for medical advice about side effects. You may report side effects to FDA at 1-800-FDA-1088. Where should I keep my medicine? Keep out of the reach of children. Store the solution in the refrigerator to improve the taste. Do not freeze. Throw away any unused medicine 48 hours after mixing. NOTE: This sheet is a summary. It may not cover all possible information. If you have questions about this medicine, talk to your doctor, pharmacist, or health care provider.  2012, [employer]vier/Gold Standard. (02/20/2010 12:51:08 PM)

## 2013-04-25 NOTE — ED Provider Notes (Signed)
History    CSN: [id number] Arrival date & time 04/24/13  1954  First MD Initiated Contact with Patient 04/25/13 0012     Chief Complaint  Patient presents with  . Abdominal Pain   (Consider location/radiation/quality/duration/timing/severity/associated sxs/prior Treatment) Patient is a 42 y.o. female presenting with abdominal pain. The history is provided by the patient.  Abdominal Pain Associated symptoms include abdominal pain.  She has a history of a gastrointestinal stromal tumor 4 years ago and had been on chemotherapy which was stopped because of side effects. She then became lost to followup. Over the last several months, she has been having intermittent problems with anorexia and weight loss. She's had about a 20 pound weight loss since her surgery in 2000 and she's not sure how much of that weight loss is recent. There has been exacerbation of chronic constipation. She normally has a bowel movement about once a week but now is going about once every 10-12 days. There's been some intermittent nausea. She denies fever, chills, sweats. She has not taken anything other than over-the-counter laxatives. Curiously, she has had 2 episodes of fecal incontinence where she was not aware of the urge to have a bowel movement. Past Medical History  Diagnosis Date  . Hypertension   . 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 Blindness of left eye     congenital   Past Surgical History  Procedure Laterality Date  . Tumor removal  2010    stromo tumor -  half of stomach removed  . Tubal ligation      was reversed   No family history on file. History  Substance Use Topics  . Smoking status: Current Every Day Smoker -- 1.00 packs/day    Types: Cigarettes  . Smokeless tobacco: Not on file  . Alcohol Use: Yes   OB History   Grav Para Term Preterm Abortions TAB SAB Ect Mult Living                 Review of Systems  Gastrointestinal: Positive for abdominal  pain.  All other systems reviewed and are negative.    Allergies  Review of patient's allergies indicates no known allergies.  Home Medications   Current Outpatient Rx  Name  Route  Sig  Dispense  Refill  . cetirizine (ZYRTEC ALLERGY) 10 MG tablet   Oral   Take 1 tablet (10 mg total) by mouth daily.   30 tablet   2   . diphenhydrAMINE (BENADRYL) 25 mg capsule   Oral   Take 1 capsule (25 mg total) by mouth every 6 (six) hours as needed for itching.   30 capsule   0   . triamcinolone cream (KENALOG) 0.1 %   Topical   Apply topically 2 (two) times daily.   30 g   2   . valsartan (DIOVAN) 160 MG tablet   Oral   Take 160 mg by mouth daily.          BP 161/103  Pulse 68  Temp(Src) 98.1 F (36.7 C) (Oral)  Resp 14  SpO2 97%  LMP 04/17/2013 Physical Exam  Nursing note and vitals reviewed.  42 year old female, resting comfortably and in no acute distress. Vital signs are significant for hypertension with blood pressure 161/103. Oxygen saturation is 97%, which is normal. Head is normocephalic and atraumatic. PERRLA, EOMI. Oropharynx is clear. Neck is nontender and supple without adenopathy or JVD. Back  is nontender and there is no CVA tenderness. Lungs are clear without rales, wheezes, or rhonchi. Chest is nontender. Heart has regular rate and rhythm without murmur. Abdomen is soft, flat, with mild suprapubic tenderness. There is no rebound or guarding. There are no masses or hepatosplenomegaly and peristalsis is normoactive. Rectal: Decreased sphincter, stool normal color without any impaction. It has been sent for Hemoccult testing. Extremities have no cyanosis or edema, full range of motion is present. Skin is warm and dry without rash. Neurologic: Mental status is normal, cranial nerves are intact, there are no motor or sensory deficits.  ED Course  Procedures (including critical care time) Results for orders placed during the hospital encounter of 04/24/13   CBC WITH DIFFERENTIAL      Result Value Range   WBC 6.2  4.0 - 10.5 K/uL   RBC 4.32  3.87 - 5.11 MIL/uL   Hemoglobin 11.5 (*) 12.0 - 15.0 g/dL   HCT 35.2 (*) 36.0 - 46.0 %   MCV 81.5  78.0 - 100.0 fL   MCH 26.6  26.0 - 34.0 pg   MCHC 32.7  30.0 - 36.0 g/dL   RDW 16.4 (*) 11.5 - 15.5 %   Platelets 256  150 - 400 K/uL   Neutrophils Relative % 54  43 - 77 %   Neutro Abs 3.4  1.7 - 7.7 K/uL   Lymphocytes Relative 37  12 - 46 %   Lymphs Abs 2.3  0.7 - 4.0 K/uL   Monocytes Relative 6  3 - 12 %   Monocytes Absolute 0.4  0.1 - 1.0 K/uL   Eosinophils Relative 2  0 - 5 %   Eosinophils Absolute 0.1  0.0 - 0.7 K/uL   Basophils Relative 0  0 - 1 %   Basophils Absolute 0.0  0.0 - 0.1 K/uL  COMPREHENSIVE METABOLIC PANEL      Result Value Range   Sodium 138  135 - 145 mEq/L   Potassium 3.6  3.5 - 5.1 mEq/L   Chloride 103  96 - 112 mEq/L   CO2 28  19 - 32 mEq/L   Glucose, Bld 92  70 - 99 mg/dL   BUN 9  6 - 23 mg/dL   Creatinine, Ser 0.88  0.50 - 1.10 mg/dL   Calcium 9.0  8.4 - 10.5 mg/dL   Total Protein 7.1  6.0 - 8.3 g/dL   Albumin 3.7  3.5 - 5.2 g/dL   AST 27  0 - 37 U/L   ALT 15  0 - 35 U/L   Alkaline Phosphatase 56  39 - 117 U/L   Total Bilirubin 0.3  0.3 - 1.2 mg/dL   GFR calc non Af Amer 81 (*) >90 mL/min   GFR calc Af Amer >90  >90 mL/min  LIPASE, BLOOD      Result Value Range   Lipase 43  11 - 59 U/L  URINALYSIS, ROUTINE W REFLEX MICROSCOPIC      Result Value Range   Color, Urine YELLOW  YELLOW   APPearance CLEAR  CLEAR   Specific Gravity, Urine 1.017  1.005 - 1.030   pH 6.0  5.0 - 8.0   Glucose, UA NEGATIVE  NEGATIVE mg/dL   Hgb urine dipstick NEGATIVE  NEGATIVE   Bilirubin Urine NEGATIVE  NEGATIVE   Ketones, ur NEGATIVE  NEGATIVE mg/dL   Protein, ur 30 (*) NEGATIVE mg/dL   Urobilinogen, UA 1.0  0.0 - 1.0 mg/dL   Nitrite NEGATIVE  NEGATIVE  Leukocytes, UA NEGATIVE  NEGATIVE  URINE MICROSCOPIC-ADD ON      Result Value Range   Squamous Epithelial / LPF FEW (*) RARE    WBC, UA 0-2  <3 WBC/hpf   Casts HYALINE CASTS (*) NEGATIVE   Urine-Other MUCOUS PRESENT    PREGNANCY, URINE      Result Value Range   Preg Test, Ur NEGATIVE  NEGATIVE  OCCULT BLOOD, POC DEVICE      Result Value Range   Fecal Occult Bld POSITIVE (*) NEGATIVE   Ct Abdomen Pelvis W Contrast  04/25/2013   *RADIOLOGY REPORT*  Clinical Data: Pain  CT ABDOMEN AND PELVIS WITH CONTRAST  Technique:  Multidetector CT imaging of the abdomen and pelvis was performed following the standard protocol during bolus administration of intravenous contrast.  Contrast: 135m OMNIPAQUE IOHEXOL 300 MG/ML  SOLN  Comparison:  prior CT from 02/03/2011.  Findings: Mild dependent atelectasis is noted within both lung bases.  The liver, spleen, and right adrenal gland demonstrate normal contrast enhanced appearance.  The left adrenal gland is not well visualized.  The pancreas is normal.  There is no biliary ductal dilatation. Signal calcified gallstone is present within the gallbladder.  There is no evidence of bowel obstruction.  Surgical clips are present near the GE junction and near the gastric antrum. A moderately large amount of retained stool present within the cecum. The appendix is not densely visualized.  The bladder is grossly unremarkable.  Multiple large fibroids are seen throughout the uterus, distorting the endometrial canal.  The largest of these measures 7.1 x 6.6 cm. Overall, these are increased in size as compared to the prior study.  No free fluid or air seen within the abdomen and pelvis.  A loculated fluid collections extending from the inferior half tip of the left rectus abdominus muscle anteriorly into the subcutaneous fat of the left lower pelvis/ suprapubic region is present, which measures 1.5 x 2.7 x 4.2 cm (series 2, image 90). This finding is of uncertain etiology.  No associated significant inflammatory stranding is seen about this lesion.  No pathologically enlarged intra-abdominal pelvic lymph  nodes are seen.  IMPRESSION: 1.  No CT evidence of acute intra-abdominal pathology identified. Moderate amount of retained stool throughout the colon. 2.  Loculated fluid collection within the subcutaneous fat of the lower left pelvis / suprapubic region as above, abutting the distal left rectus abdominus muscle posteriorly and superiorly.  This finding is of uncertain etiology, and may represent a postoperative seroma or lymphocele if the patient has had intervention in this region in the past.  This is not a typical appearance for necrotic adenopathy, and no other adenopathy is seen within the abdomen pelvis.  Clinical correlation is recommended. 3.  Fibroid uterus, increased in size and burden as compared to prior CT from 02/03/2011.   Original Report Authenticated By: BJeannine Boga M.D.     1. Abdominal pain   2. Constipation   3. Heme positive stool     MDM  Chronic constipation but has worsened. She does not have a physician locally and has not had any followup for her cancer. Laboratory workup is unremarkable including normal transaminases. CT will be obtained to look for evidence of recurrence and she will be referred back to oncology and to [facility] to try and get established with a primary care physician. Blood pressure will be repeated.  Stool is Hemoccult positive but hemoglobin has not changed from baseline. CT is unremarkable. She is referred  to gastroenterology for workup of her Hemoccult positive stool and referred back to oncology for long-term followup of her stromal tumor. She's given a prescription for polyethylene glycol and instructions on long term management of constipation. Repeat vital signs have shown blood pressures come down dramatically although still mildly elevated.  Delora Fuel, MD 14/38/88 [id number]

## 2013-04-25 NOTE — ED Notes (Signed)
Pt back from CT scan

## 2013-04-25 NOTE — ED Notes (Signed)
Pt finished drinking the contrast. CT notified

## 2013-06-14 ENCOUNTER — Emergency Department (HOSPITAL_COMMUNITY)
Admission: EM | Admit: 2013-06-14 | Discharge: 2013-06-14 | Discharge status: Against Medical Advice | Payer: BC Managed Care – PPO | Attending: Emergency Medicine | Admitting: Emergency Medicine

## 2013-06-14 ENCOUNTER — Encounter (HOSPITAL_COMMUNITY): Payer: Self-pay | Admitting: Emergency Medicine

## 2013-06-14 DIAGNOSIS — R110 Nausea: Secondary | ICD-10-CM | POA: Insufficient documentation

## 2013-06-14 DIAGNOSIS — R51 Headache: Secondary | ICD-10-CM | POA: Insufficient documentation

## 2013-06-14 DIAGNOSIS — R109 Unspecified abdominal pain: Secondary | ICD-10-CM | POA: Insufficient documentation

## 2013-06-14 NOTE — ED Notes (Signed)
Pt reports she been having lower abdominal pain since Monday. C/o headaches and nausea, denies vomiting, diarrhea. Denies fever. States there is a knot on lower left abdomen which she wants checked out. Pt alert, oriented x4, NAD at present.

## 2013-06-14 NOTE — ED Notes (Signed)
No answer in waiting area.

## 2013-06-14 NOTE — ED Notes (Signed)
Unable to locate pt to take to treatment room.

## 2013-06-14 NOTE — ED Notes (Signed)
No answer in waiting room 

## 2013-06-19 ENCOUNTER — Other Ambulatory Visit: Payer: Self-pay | Admitting: Family Medicine

## 2013-06-19 DIAGNOSIS — R109 Unspecified abdominal pain: Secondary | ICD-10-CM

## 2013-06-21 ENCOUNTER — Other Ambulatory Visit: Payer: BC Managed Care – PPO

## 2013-06-22 ENCOUNTER — Ambulatory Visit
Admission: RE | Admit: 2013-06-22 | Discharge: 2013-06-22 | Disposition: A | Discharge status: Home / Self Care | Payer: BC Managed Care – PPO | Source: Ambulatory Visit | Attending: Family Medicine | Admitting: Family Medicine

## 2013-06-22 DIAGNOSIS — R109 Unspecified abdominal pain: Secondary | ICD-10-CM

## 2013-06-22 MED ORDER — IOHEXOL 300 MG/ML  SOLN
100.0000 mL | Freq: Once | INTRAMUSCULAR | Status: AC | PRN
  Administered 2013-06-22: 100 mL via INTRAVENOUS

## 2013-06-29 ENCOUNTER — Other Ambulatory Visit: Payer: Self-pay | Admitting: *Deleted

## 2013-06-29 ENCOUNTER — Telehealth: Payer: Self-pay | Admitting: *Deleted

## 2013-06-29 NOTE — Telephone Encounter (Signed)
Referral from Dr. Criss Rosales received.  Dr. Benay Spice reviewed the information.  Per Dr. Benay Spice, schedule patient for 30 minutes in next 2-3 weeks.  POF sent to scheduler.

## 2013-07-03 ENCOUNTER — Other Ambulatory Visit (HOSPITAL_COMMUNITY): Payer: Self-pay | Admitting: Obstetrics and Gynecology

## 2013-07-03 ENCOUNTER — Telehealth: Payer: Self-pay | Admitting: Oncology

## 2013-07-03 DIAGNOSIS — N971 Female infertility of tubal origin: Secondary | ICD-10-CM

## 2013-07-03 NOTE — Telephone Encounter (Signed)
lmonvm for pt re appt for 10/13. Schedule mailed. Per 9/25 pof 2-3 wks for 30 min w/BS

## 2013-07-05 ENCOUNTER — Telehealth: Payer: Self-pay | Admitting: *Deleted

## 2013-07-05 NOTE — Telephone Encounter (Signed)
Spoke with patient by phone and confirmed appointment for 07/17/13.  Directions and phone numbers were provided.  Patient without questions.

## 2013-07-10 ENCOUNTER — Ambulatory Visit: Payer: Self-pay | Admitting: Obstetrics

## 2013-07-11 ENCOUNTER — Ambulatory Visit (HOSPITAL_COMMUNITY)
Admission: RE | Admit: 2013-07-11 | Discharge: 2013-07-11 | Disposition: A | Discharge status: Home / Self Care | Payer: BC Managed Care – PPO | Source: Ambulatory Visit | Attending: Obstetrics and Gynecology | Admitting: Obstetrics and Gynecology

## 2013-07-11 DIAGNOSIS — N971 Female infertility of tubal origin: Secondary | ICD-10-CM

## 2013-07-11 DIAGNOSIS — Z9851 Tubal ligation status: Secondary | ICD-10-CM | POA: Insufficient documentation

## 2013-07-11 DIAGNOSIS — D259 Leiomyoma of uterus, unspecified: Secondary | ICD-10-CM | POA: Insufficient documentation

## 2013-07-11 DIAGNOSIS — N979 Female infertility, unspecified: Secondary | ICD-10-CM | POA: Insufficient documentation

## 2013-07-11 MED ORDER — IOHEXOL 300 MG/ML  SOLN
20.0000 mL | Freq: Once | INTRAMUSCULAR | Status: AC | PRN
  Administered 2013-07-11: 20 mL

## 2013-07-17 ENCOUNTER — Ambulatory Visit: Payer: BC Managed Care – PPO | Admitting: Oncology

## 2013-08-11 ENCOUNTER — Telehealth: Payer: Self-pay | Admitting: Oncology

## 2013-08-11 NOTE — Telephone Encounter (Signed)
Pt called and wants appt with MD, pt FTKA 10/13th, emailed Dr. Benay Spice, waiting for MD response

## 2013-09-04 ENCOUNTER — Telehealth: Payer: Self-pay | Admitting: *Deleted

## 2013-09-04 NOTE — Telephone Encounter (Signed)
Spoke with patient by phone.  Patient stated she had cancelled her GYN surgery that was scheduled for 09/15/13.  She stated she needed an appointment with Dr. Benay Spice.  Gave and confirmed appointment with Dr. Benay Spice for 09/15/13.  This RN stressed the importance of keeping her appointment.  Directions, contact names and numbers were provided.  Patient verbalized comprehension.

## 2013-09-04 NOTE — Telephone Encounter (Signed)
Correction to earlier note.  Gave and confirmed appointment with Dr. Benay Spice for 09/14/13.

## 2013-09-07 ENCOUNTER — Inpatient Hospital Stay (HOSPITAL_COMMUNITY): Admission: RE | Admit: 2013-09-07 | Payer: BC Managed Care – PPO | Source: Ambulatory Visit

## 2013-09-14 ENCOUNTER — Ambulatory Visit: Payer: BC Managed Care – PPO

## 2013-09-14 ENCOUNTER — Encounter (INDEPENDENT_AMBULATORY_CARE_PROVIDER_SITE_OTHER): Payer: Self-pay

## 2013-09-14 ENCOUNTER — Ambulatory Visit (HOSPITAL_BASED_OUTPATIENT_CLINIC_OR_DEPARTMENT_OTHER): Payer: BC Managed Care – PPO | Admitting: Oncology

## 2013-09-14 VITALS — BP 139/95 | HR 77 | Temp 98.2°F | Resp 20 | Ht 69.0 in | Wt 170.3 lb

## 2013-09-14 DIAGNOSIS — C49A0 Gastrointestinal stromal tumor, unspecified site: Secondary | ICD-10-CM

## 2013-09-14 DIAGNOSIS — D509 Iron deficiency anemia, unspecified: Secondary | ICD-10-CM

## 2013-09-14 DIAGNOSIS — I10 Essential (primary) hypertension: Secondary | ICD-10-CM

## 2013-09-14 DIAGNOSIS — Z85028 Personal history of other malignant neoplasm of stomach: Secondary | ICD-10-CM

## 2013-09-14 NOTE — Progress Notes (Signed)
   [place]    OFFICE PROGRESS NOTE   INTERVAL HISTORY:   She was last seen at the [facility] in June of 2012. She is now followed by Dr. Criss Rosales for internal medicine care and Dr. Maudry Diego for gynecologic care.  [name] complains of abdominal distention. She reports that she has been scheduled for hysterectomy in March for treatment of uterine fibroids.  She was seen in the emergency room in July of 2014 with abdominal pain. A CT revealed no acute abnormality. A loculated fluid collection was noted in the subcutaneous fat of the left pelvis/suprapubic region and was felt to potentially represent a postoperative seroma or lymphocele. No adenopathy in the abdomen or pelvis. Uterine fibroids.  The stool return Hemoccult positive. She was referred to gastroenterology, but she reports that she has not been seen. She denies bleeding.  View systems: Positives: Chronic dyspnea, intermittent stopping and starting of the urine flow for the past one month, chronic constipation, heavy monthly menstrual cycle A complete review of systems was otherwise negative  Objective:  Vital signs in last 24 hours:  Blood pressure 139/95, pulse 77, temperature 98.2 F (36.8 C), temperature source Oral, resp. rate 20, height 5' 9"  (1.753 m), weight 170 lb 4.8 oz (77.248 kg).    HEENT: Neck without mass Lymphatics: No cervical, supraclavicular, axillary, or inguinal nodes Resp: Lungs clear bilaterally Cardio: Regular rate and rhythm GI: No hepatosplenomegaly, no mass, no apparent ascites, tender in the low mid abdomen/suprapubic region. Tubular fullness in the subcutaneous tissue/abdominal wall in the left greater than right suprapubic areas without a discrete mass Vascular: No leg edema   Lab Results:  Lab Results  Component Value Date   WBC 6.2 04/24/2013   HGB 11.5* 04/24/2013   HCT 35.2* 04/24/2013   MCV 81.5 04/24/2013   PLT 256 04/24/2013   Ferritin on  01/20/2011-6   Medications: I have reviewed the patient's current medications.  Assessment/Plan:  1. 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 The Gleevec was held beginning in April of 2010 D. to an ectopic pregnancy and elevated liver enzymes  2. Iron deficiency anemia  3. Hypertension  4. Uterine fibroids  5. Feeling of abdominal distention-likely related to uterine fibroids  Disposition:  She has a history of a gastrointestinal stromal tumor of the stomach. I have a low clinical suspicion for recurrence of the gastrointestinal stromal tumor. I suspect her abdominal symptoms are related to the large uterine fibroids. She plans to followup with Dr. Garwin Brothers for a hysterectomy.   She has a history of iron deficiency anemia. This is likely secondary to heavy menstrual blood loss, but the stool was Hemoccult positive in July. We will make a referral to gastroenterology to consider a colonoscopy and upper endoscopy evaluation.  I reviewed the July 2014 CT images with her. I will review the CT with a radiologist regarding the pelvic fluid collection. This may be related to previous surgery including reported surgery for an ectopic pregnancy.  She will continue followup with Dr. Criss Rosales for management of hypertension and internal medicine care. She will return for an office visit here in 6 months. We will see her sooner as needed.   Betsy Coder, MD  09/14/2013  2:38 PM

## 2013-09-15 ENCOUNTER — Ambulatory Visit (HOSPITAL_COMMUNITY)
Admission: RE | Admit: 2013-09-15 | Payer: BC Managed Care – PPO | Source: Ambulatory Visit | Admitting: Obstetrics and Gynecology

## 2013-09-15 ENCOUNTER — Encounter (HOSPITAL_COMMUNITY): Admission: RE | Payer: Self-pay | Source: Ambulatory Visit

## 2013-09-15 SURGERY — ROBOTIC ASSISTED TOTAL HYSTERECTOMY
Anesthesia: General

## 2013-12-28 ENCOUNTER — Ambulatory Visit (HOSPITAL_COMMUNITY)
Admission: RE | Admit: 2013-12-28 | Payer: BC Managed Care – PPO | Source: Ambulatory Visit | Admitting: Obstetrics and Gynecology

## 2013-12-28 ENCOUNTER — Encounter (HOSPITAL_COMMUNITY): Admission: RE | Payer: Self-pay | Source: Ambulatory Visit

## 2013-12-28 SURGERY — ROBOTIC ASSISTED TOTAL HYSTERECTOMY
Anesthesia: General

## 2014-03-08 ENCOUNTER — Emergency Department (HOSPITAL_COMMUNITY)
Admission: EM | Admit: 2014-03-08 | Discharge: 2014-03-08 | Disposition: A | Discharge status: Home / Self Care | Payer: BC Managed Care – PPO | Source: Home / Self Care | Attending: Family Medicine | Admitting: Family Medicine

## 2014-03-08 ENCOUNTER — Encounter (HOSPITAL_COMMUNITY): Payer: Self-pay | Admitting: Emergency Medicine

## 2014-03-08 DIAGNOSIS — I10 Essential (primary) hypertension: Secondary | ICD-10-CM | POA: Diagnosis present

## 2014-03-08 DIAGNOSIS — M545 Low back pain, unspecified: Secondary | ICD-10-CM

## 2014-03-08 LAB — POCT URINALYSIS DIP (DEVICE)
Bilirubin Urine: NEGATIVE
GLUCOSE, UA: NEGATIVE mg/dL
HGB URINE DIPSTICK: NEGATIVE
Ketones, ur: NEGATIVE mg/dL
Leukocytes, UA: NEGATIVE
NITRITE: NEGATIVE
PROTEIN: NEGATIVE mg/dL
Specific Gravity, Urine: 1.025 (ref 1.005–1.030)
UROBILINOGEN UA: 0.2 mg/dL (ref 0.0–1.0)
pH: 6.5 (ref 5.0–8.0)

## 2014-03-08 MED ORDER — CYCLOBENZAPRINE HCL 5 MG PO TABS: 5.0000 mg | ORAL_TABLET | Freq: Every evening | ORAL | Status: DC | PRN

## 2014-03-08 MED ORDER — TRAMADOL HCL 50 MG PO TABS: 50.0000 mg | ORAL_TABLET | Freq: Four times a day (QID) | ORAL | Status: DC | PRN

## 2014-03-08 MED ORDER — AMLODIPINE BESYLATE 10 MG PO TABS: 10.0000 mg | ORAL_TABLET | Freq: Every day | ORAL | Status: DC

## 2014-03-08 NOTE — ED Notes (Signed)
Off of her BP medication due to insurance issues and could not see her MD, but will be returning, since she has recently gotten insurance again. C/o pain in her back x 2 weeks. Works in health care , direct patient care, lifting

## 2014-03-08 NOTE — Discharge Instructions (Signed)
Thank you for coming in today. Use a heating pad.  Do not drive after taking tramadol.  Use tylenol for pain as well.  Follow up with your doctor.  Come back or go to the emergency room if you notice new weakness new numbness problems walking or bowel or bladder problems. Call or go to the emergency room if you get worse, have trouble breathing, have chest pains, or palpitations.    Back Exercises Back exercises help treat and prevent back injuries. The goal of back exercises is to increase the strength of your abdominal and back muscles and the flexibility of your back. These exercises should be started when you no longer have back pain. Back exercises include:  Pelvic Tilt. Lie on your back with your knees bent. Tilt your pelvis until the lower part of your back is against the floor. Hold this position 5 to 10 sec and repeat 5 to 10 times.  Knee to Chest. Pull first 1 knee up against your chest and hold for 20 to 30 seconds, repeat this with the other knee, and then both knees. This may be done with the other leg straight or bent, whichever feels better.  Sit-Ups or Curl-Ups. Bend your knees 90 degrees. Start with tilting your pelvis, and do a partial, slow sit-up, lifting your trunk only 30 to 45 degrees off the floor. Take at least 2 to 3 seconds for each sit-up. Do not do sit-ups with your knees out straight. If partial sit-ups are difficult, simply do the above but with only tightening your abdominal muscles and holding it as directed.  Hip-Lift. Lie on your back with your knees flexed 90 degrees. Push down with your feet and shoulders as you raise your hips a couple inches off the floor; hold for 10 seconds, repeat 5 to 10 times.  Back arches. Lie on your stomach, propping yourself up on bent elbows. Slowly press on your hands, causing an arch in your low back. Repeat 3 to 5 times. Any initial stiffness and discomfort should lessen with repetition over time.  Shoulder-Lifts. Lie face  down with arms beside your body. Keep hips and torso pressed to floor as you slowly lift your head and shoulders off the floor. Do not overdo your exercises, especially in the beginning. Exercises may cause you some mild back discomfort which lasts for a few minutes; however, if the pain is more severe, or lasts for more than 15 minutes, do not continue exercises until you see your caregiver. Improvement with exercise therapy for back problems is slow.  See your caregivers for assistance with developing a proper back exercise program. Document Released: 10/29/2004 Document Revised: 12/14/2011 Document Reviewed: 07/23/2011 [facility] Patient Information 2014 [place].  Arterial Hypertension Arterial hypertension (high blood pressure) is a condition of elevated pressure in your blood vessels. Hypertension over a long period of time is a risk factor for strokes, heart attacks, and heart failure. It is also the leading cause of kidney (renal) failure.  CAUSES   In Adults -- Over 90% of all hypertension has no known cause. This is called essential or primary hypertension. In the other 10% of people with hypertension, the increase in blood pressure is caused by another disorder. This is called secondary hypertension. Important causes of secondary hypertension are:  Heavy alcohol use.  Obstructive sleep apnea.  Hyperaldosterosim (Conn's syndrome).  Steroid use.  Chronic kidney failure.  Hyperparathyroidism.  Medications.  Renal artery stenosis.  Pheochromocytoma.  Cushing's disease.  Coarctation of the  aorta.  Scleroderma renal crisis.  Licorice (in excessive amounts).  Drugs (cocaine, methamphetamine). Your caregiver can explain any items above that apply to you.  In Children -- Secondary hypertension is more common and should always be considered.  Pregnancy -- Few women of childbearing age have high blood pressure. However, up to 10% of them develop hypertension of  pregnancy. Generally, this will not harm the woman. It may be a sign of 3 complications of pregnancy: preeclampsia, HELLP syndrome, and eclampsia. Follow up and control with medication is necessary. SYMPTOMS   This condition normally does not produce any noticeable symptoms. It is usually found during a routine exam.  Malignant hypertension is a late problem of high blood pressure. It may have the following symptoms:  Headaches.  Blurred vision.  End-organ damage (this means your kidneys, heart, lungs, and other organs are being damaged).  Stressful situations can increase the blood pressure. If a person with normal blood pressure has their blood pressure go up while being seen by their caregiver, this is often termed "white coat hypertension." Its importance is not known. It may be related with eventually developing hypertension or complications of hypertension.  Hypertension is often confused with mental tension, stress, and anxiety. DIAGNOSIS  The diagnosis is made by 3 separate blood pressure measurements. They are taken at least 1 week apart from each other. If there is organ damage from hypertension, the diagnosis may be made without repeat measurements. Hypertension is usually identified by having blood pressure readings:  Above 140/90 mmHg measured in both arms, at 3 separate times, over a couple weeks.  Over 130/80 mmHg should be considered a risk factor and may require treatment in patients with diabetes. Blood pressure readings over 120/80 mmHg are called "pre-hypertension" even in non-diabetic patients. To get a true blood pressure measurement, use the following guidelines. Be aware of the factors that can alter blood pressure readings.  Take measurements at least 1 hour after caffeine.  Take measurements 30 minutes after smoking and without any stress. This is another reason to quit smoking  it raises your blood pressure.  Use a proper cuff size. Ask your caregiver if you  are not sure about your cuff size.  Most home blood pressure cuffs are automatic. They will measure systolic and diastolic pressures. The systolic pressure is the pressure reading at the start of sounds. Diastolic pressure is the pressure at which the sounds disappear. If you are elderly, measure pressures in multiple postures. Try sitting, lying or standing.  Sit at rest for a minimum of 5 minutes before taking measurements.  You should not be on any medications like decongestants. These are found in many cold medications.  Record your blood pressure readings and review them with your caregiver. If you have hypertension:  Your caregiver may do tests to be sure you do not have secondary hypertension (see "causes" above).  Your caregiver may also look for signs of metabolic syndrome. This is also called Syndrome X or Insulin Resistance Syndrome. You may have this syndrome if you have type 2 diabetes, abdominal obesity, and abnormal blood lipids in addition to hypertension.  Your caregiver will take your medical and family history and perform a physical exam.  Diagnostic tests may include blood tests (for glucose, cholesterol, potassium, and kidney function), a urinalysis, or an EKG. Other tests may also be necessary depending on your condition. PREVENTION  There are important lifestyle issues that you can adopt to reduce your chance of developing hypertension:  Maintain  a normal weight.  Limit the amount of salt (sodium) in your diet.  Exercise often.  Limit alcohol intake.  Get enough potassium in your diet. Discuss specific advice with your caregiver.  Follow a DASH diet (dietary approaches to stop hypertension). This diet is rich in fruits, vegetables, and low-fat dairy products, and avoids certain fats. PROGNOSIS  Essential hypertension cannot be cured. Lifestyle changes and medical treatment can lower blood pressure and reduce complications. The prognosis of secondary  hypertension depends on the underlying cause. Many people whose hypertension is controlled with medicine or lifestyle changes can live a normal, healthy life.  RISKS AND COMPLICATIONS  While high blood pressure alone is not an illness, it often requires treatment due to its short- and long-term effects on many organs. Hypertension increases your risk for:  CVAs or strokes (cerebrovascular accident).  Heart failure due to chronically high blood pressure (hypertensive cardiomyopathy).  Heart attack (myocardial infarction).  Damage to the retina (hypertensive retinopathy).  Kidney failure (hypertensive nephropathy). Your caregiver can explain list items above that apply to you. Treatment of hypertension can significantly reduce the risk of complications. TREATMENT   For overweight patients, weight loss and regular exercise are recommended. Physical fitness lowers blood pressure.  Mild hypertension is usually treated with diet and exercise. A diet rich in fruits and vegetables, fat-free dairy products, and foods low in fat and salt (sodium) can help lower blood pressure. Decreasing salt intake decreases blood pressure in a 1/3 of people.  Stop smoking if you are a smoker. The steps above are highly effective in reducing blood pressure. While these actions are easy to suggest, they are difficult to achieve. Most patients with moderate or severe hypertension end up requiring medications to bring their blood pressure down to a normal level. There are several classes of medications for treatment. Blood pressure pills (antihypertensives) will lower blood pressure by their different actions. Lowering the blood pressure by 10 mmHg may decrease the risk of complications by as much as 25%. The goal of treatment is effective blood pressure control. This will reduce your risk for complications. Your caregiver will help you determine the best treatment for you according to your lifestyle. What is excellent  treatment for one person, may not be for you. HOME CARE INSTRUCTIONS   Do not smoke.  Follow the lifestyle changes outlined in the "Prevention" section.  If you are on medications, follow the directions carefully. Blood pressure medications must be taken as prescribed. Skipping doses reduces their benefit. It also puts you at risk for problems.  Follow up with your caregiver, as directed.  If you are asked to monitor your blood pressure at home, follow the guidelines in the "Diagnosis" section above. SEEK MEDICAL CARE IF:   You think you are having medication side effects.  You have recurrent headaches or lightheadedness.  You have swelling in your ankles.  You have trouble with your vision. SEEK IMMEDIATE MEDICAL CARE IF:   You have sudden onset of chest pain or pressure, difficulty breathing, or other symptoms of a heart attack.  You have a severe headache.  You have symptoms of a stroke (such as sudden weakness, difficulty speaking, difficulty walking). MAKE SURE YOU:   Understand these instructions.  Will watch your condition.  Will get help right away if you are not doing well or get worse. Document Released: 09/21/2005 Document Revised: 12/14/2011 Document Reviewed: 04/21/2007 [facility] Patient Information 2014 [place].

## 2014-03-08 NOTE — ED Provider Notes (Signed)
Sydney [name] is a 43 y.o. female who presents to Urgent Care today for back pain for 2 weeks associated with change in urine color. No urinary frequency or urgency. Pain is moderate and worse with activity. No radiating pain weakness or numbness. No fevers or chills nausea vomiting or diarrhea.   Patient notes that she has run out of her blood pressure medication and has been unable to see her doctor as she did not have insurance. She recently got insurance again. She denies any chest pain.  Past Medical History  Diagnosis Date  . Hypertension   . 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 Blindness of left eye     congenital   History  Substance Use Topics  . Smoking status: Current Every Day Smoker -- 1.00 packs/day    Types: Cigarettes  . Smokeless tobacco: Not on file  . Alcohol Use: Yes   ROS as above Medications: No current facility-administered medications for this encounter.   Current Outpatient Prescriptions  Medication Sig Dispense Refill  . amLODipine (NORVASC) 10 MG tablet Take 1 tablet (10 mg total) by mouth daily.  30 tablet  0  . Aspirin-Salicylamide-Caffeine (BC HEADACHE POWDER PO) Take 1 packet by mouth 2 (two) times daily as needed (for pain).      . cyclobenzaprine (FLEXERIL) 5 MG tablet Take 1 tablet (5 mg total) by mouth at bedtime as needed for muscle spasms.  20 tablet  0  . traMADol (ULTRAM) 50 MG tablet Take 1 tablet (50 mg total) by mouth every 6 (six) hours as needed.  15 tablet  0    Exam:  BP 161/109  Pulse 74  Temp(Src) 97.9 F (36.6 C) (Oral)  Resp 14  SpO2 98% Gen: Well NAD HEENT: EOMI,  MMM Lungs: Normal work of breathing. CTABL Heart: RRR no MRG Abd: NABS, Soft. NT, ND no CV angle tenderness to percussion Exts: Brisk capillary refill, warm and well perfused.  Back: Nontender spinal midline. Mildly tender bilateral lumbar paraspinals. Full back range of motion lower extremity strength reflexes are intact.  Normal gait.  Point-of-care urine pregnancy test was negative.  Results for orders placed during the hospital encounter of 03/08/14 (from the past 24 hour(s))  POCT URINALYSIS DIP (DEVICE)     Status: None   Collection Time    03/08/14  2:49 PM      Result Value Ref Range   Glucose, UA NEGATIVE  NEGATIVE mg/dL   Bilirubin Urine NEGATIVE  NEGATIVE   Ketones, ur NEGATIVE  NEGATIVE mg/dL   Specific Gravity, Urine 1.025  1.005 - 1.030   Hgb urine dipstick NEGATIVE  NEGATIVE   pH 6.5  5.0 - 8.0   Protein, ur NEGATIVE  NEGATIVE mg/dL   Urobilinogen, UA 0.2  0.0 - 1.0 mg/dL   Nitrite NEGATIVE  NEGATIVE   Leukocytes, UA NEGATIVE  NEGATIVE   No results found.  Assessment and Plan: 43 y.o. female with  1) Lumbago. Plan to treat with tramadol Tylenol and Flexeril. Additionally use a heating pad in home exercise program. 2) hypertension: Restart amlodipine. Followup with primary care provider  Discussed warning signs or symptoms. Please see discharge instructions. Patient expresses understanding.    Gregor Hams, MD 03/08/14 [phone]

## 2014-04-24 ENCOUNTER — Encounter (HOSPITAL_COMMUNITY): Payer: Self-pay | Admitting: Emergency Medicine

## 2014-04-24 ENCOUNTER — Emergency Department (HOSPITAL_COMMUNITY)
Admission: EM | Admit: 2014-04-24 | Discharge: 2014-04-24 | Disposition: A | Discharge status: Home / Self Care | Payer: BC Managed Care – PPO | Attending: Emergency Medicine | Admitting: Emergency Medicine

## 2014-04-24 DIAGNOSIS — Z8719 Personal history of other diseases of the digestive system: Secondary | ICD-10-CM | POA: Insufficient documentation

## 2014-04-24 DIAGNOSIS — M79605 Pain in left leg: Secondary | ICD-10-CM

## 2014-04-24 DIAGNOSIS — M79609 Pain in unspecified limb: Secondary | ICD-10-CM | POA: Insufficient documentation

## 2014-04-24 DIAGNOSIS — F17200 Nicotine dependence, unspecified, uncomplicated: Secondary | ICD-10-CM | POA: Insufficient documentation

## 2014-04-24 DIAGNOSIS — Z79899 Other long term (current) drug therapy: Secondary | ICD-10-CM | POA: Insufficient documentation

## 2014-04-24 DIAGNOSIS — H5440 Blindness, one eye, unspecified eye: Secondary | ICD-10-CM | POA: Insufficient documentation

## 2014-04-24 DIAGNOSIS — I10 Essential (primary) hypertension: Secondary | ICD-10-CM | POA: Insufficient documentation

## 2014-04-24 MED ORDER — OXYCODONE-ACETAMINOPHEN 5-325 MG PO TABS: 1.0000 | ORAL_TABLET | ORAL | Status: DC | PRN

## 2014-04-24 MED ORDER — DIAZEPAM 5 MG PO TABS: 5.0000 mg | ORAL_TABLET | Freq: Three times a day (TID) | ORAL | Status: DC | PRN

## 2014-04-24 MED ORDER — OXYCODONE-ACETAMINOPHEN 5-325 MG PO TABS
2.0000 | ORAL_TABLET | Freq: Once | ORAL | Status: AC
  Administered 2014-04-24: 2 via ORAL
  Filled 2014-04-24: qty 2

## 2014-04-24 MED ORDER — MORPHINE SULFATE 4 MG/ML IJ SOLN
4.0000 mg | Freq: Once | INTRAMUSCULAR | Status: AC
  Administered 2014-04-24: 4 mg via INTRAMUSCULAR
  Filled 2014-04-24: qty 1

## 2014-04-24 MED ORDER — IBUPROFEN 800 MG PO TABS: 800.0000 mg | ORAL_TABLET | Freq: Three times a day (TID) | ORAL | Status: DC | PRN

## 2014-04-24 MED ORDER — KETOROLAC TROMETHAMINE 60 MG/2ML IM SOLN
60.0000 mg | Freq: Once | INTRAMUSCULAR | Status: AC
  Administered 2014-04-24: 60 mg via INTRAMUSCULAR
  Filled 2014-04-24: qty 2

## 2014-04-24 NOTE — ED Provider Notes (Signed)
TIME SEEN: 7:49 AM  CHIEF COMPLAINT: Left leg pain  HPI: Patient is a 43 year old female with history of stromal tumor in remission, hypertension who presents the emergency department with left leg pain for the past 2 weeks. She denies any injury. She states in May she had similar symptoms with her right leg but was having back pain at that time. She was on tramadol and Flexeril which she states she has had left over tablets for it she's been trying to take it for this pain but it has not been helping. She denies any numbness, tingling or focal weakness. No bowel or bladder incontinence. No fever. No leg swelling. She states that her pain is worse with movement of her leg. She states she is having posterior calf and posterior knee pain as well as anterior thigh pain. No prior history of PE or DVT, recent prolonged immobilization such as long flight or hospitalization, surgery, trauma, fracture, exogenous hormone use the patient is a smoker. She has had a history of a stromal tumor of her stomach, status post resection in 2010 and chemotherapy in 2011.  ROS: See HPI Constitutional: no fever  Eyes: no drainage  ENT: no runny nose   Cardiovascular:  no chest pain  Resp: no SOB  GI: no vomiting GU: no dysuria Integumentary: no rash  Allergy: no hives  Musculoskeletal: no leg swelling  Neurological: no slurred speech ROS otherwise negative  PAST MEDICAL HISTORY/PAST SURGICAL HISTORY:  Past Medical History  Diagnosis Date  . Hypertension   . 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 Blindness of left eye     congenital    MEDICATIONS:  Prior to Admission medications   Medication Sig Start Date End Date Taking? Authorizing Provider  amLODipine (NORVASC) 10 MG tablet Take 1 tablet (10 mg total) by mouth daily. 03/08/14   Gregor Hams, MD  Aspirin-Salicylamide-Caffeine (BC HEADACHE POWDER PO) Take 1 packet by mouth 2 (two) times daily as needed (for pain).    Historical  Provider, MD  cyclobenzaprine (FLEXERIL) 5 MG tablet Take 1 tablet (5 mg total) by mouth at bedtime as needed for muscle spasms. 03/08/14   Gregor Hams, MD    ALLERGIES:  No Known Allergies  SOCIAL HISTORY:  History  Substance Use Topics  . Smoking status: Current Every Day Smoker -- 1.00 packs/day    Types: Cigarettes  . Smokeless tobacco: Not on file  . Alcohol Use: Yes    FAMILY HISTORY: No family history on file.  EXAM: BP 153/114  Pulse 78  Temp(Src) 98.4 F (36.9 C)  Resp 20  SpO2 100%  LMP 04/20/2014 CONSTITUTIONAL: Alert and oriented and responds appropriately to questions. Well-appearing; well-nourished HEAD: Normocephalic EYES: Conjunctivae clear, PERRL ENT: normal nose; no rhinorrhea; moist mucous membranes; pharynx without lesions noted NECK: Supple, no meningismus, no LAD  CARD: RRR; S1 and S2 appreciated; no murmurs, no clicks, no rubs, no gallops RESP: Normal chest excursion without splinting or tachypnea; breath sounds clear and equal bilaterally; no wheezes, no rhonchi, no rales,  ABD/GI: Normal bowel sounds; non-distended; soft, non-tender, no rebound, no guarding BACK:  The back appears normal and is non-tender to palpation, there is no CVA tenderness EXT: Patient's left lower extremity is diffusely tender to palpation without obvious swelling, no induration or warmth or erythema, no skin lesions, no joint effusions, no bony deformity, ecchymosis, 2+ DP pulses bilaterally, sensation slight touch intact diffusely, Normal ROM  in all joints; otherwise extremities are non-tender to palpation; no edema; normal capillary refill; no cyanosis    SKIN: Normal color for age and race; warm NEURO: Moves all extremities equally, sensation to light touch intact diffusely, 2+ bilateral patellar and Achilles reflexes, no clonus, patient has pain with movement of her left leg and has to use her hands to help pick her leg up onto the bed secondary to pain but no weakness PSYCH:  The patient's mood and manner are appropriate. Grooming and personal hygiene are appropriate.  MEDICAL DECISION MAKING: Patient here with left leg pain for 2 weeks. No signs of septic arthritis, cellulitis, neurologic deficit, back pain. Given she has a history of tobacco use and prior history of cancer will obtain ultrasound to rule out DVT. We'll give pain medication.  ED PROGRESS: Patient's ultrasound shows no DVT, or thrombophlebitis or Baker cysts. Patient is requesting a test to "check my nerves and mild. Have discussed with patient given her normal neurologic exam, do not feel she needs any further emergent testing but have recommended close PCP followup to which she agrees. She states that her pain is slightly improved and is requesting more medication. We'll give IM morphine and Toradol prior to discharge. Her blood pressure has improved as her pain has improved. I feel she is safe to be discharged home. Discussed supportive care instructions and return precautions. She verbalized understanding and is comfortable with plan.     [place], DO 04/24/14 [phone]

## 2014-04-24 NOTE — ED Notes (Addendum)
Pt states in May her right side of back and leg was hurting, states it stopped for a while now here left leg from thigh to ankle is hurting x 2 weeks. states everything she does affects her leg. Took tramadol and muscle relaxant w/o relief.

## 2014-04-24 NOTE — Progress Notes (Signed)
Left lower extremity venous duplex completed.  Left:  No evidence of DVT, superficial thrombosis, or Baker's cyst.  Right:  Negative for DVT in the common femoral vein.  

## 2014-04-24 NOTE — Discharge Instructions (Signed)
Musculoskeletal Pain Musculoskeletal pain is muscle and boney aches and pains. These pains can occur in any part of the body. Your caregiver may treat you without knowing the cause of the pain. They may treat you if blood or urine tests, X-rays, and other tests were normal.  CAUSES There is often not a definite cause or reason for these pains. These pains may be caused by a type of germ (virus). The discomfort may also come from overuse. Overuse includes working out too hard when your body is not fit. Boney aches also come from weather changes. Bone is sensitive to atmospheric pressure changes. HOME CARE INSTRUCTIONS   Ask when your test results will be ready. Make sure you get your test results.  Only take over-the-counter or prescription medicines for pain, discomfort, or fever as directed by your caregiver. If you were given medications for your condition, do not drive, operate machinery or power tools, or sign legal documents for 24 hours. Do not drink alcohol. Do not take sleeping pills or other medications that may interfere with treatment.  Continue all activities unless the activities cause more pain. When the pain lessens, slowly resume normal activities. Gradually increase the intensity and duration of the activities or exercise.  During periods of severe pain, bed rest may be helpful. Lay or sit in any position that is comfortable.  Putting ice on the injured area.  Put ice in a bag.  Place a towel between your skin and the bag.  Leave the ice on for 15 to 20 minutes, 3 to 4 times a day.  Follow up with your caregiver for continued problems and no reason can be found for the pain. If the pain becomes worse or does not go away, it may be necessary to repeat tests or do additional testing. Your caregiver may need to look further for a possible cause. SEEK IMMEDIATE MEDICAL CARE IF:  You have pain that is getting worse and is not relieved by medications.  You develop chest pain  that is associated with shortness or breath, sweating, feeling sick to your stomach (nauseous), or throw up (vomit).  Your pain becomes localized to the abdomen.  You develop any new symptoms that seem different or that concern you. MAKE SURE YOU:   Understand these instructions.  Will watch your condition.  Will get help right away if you are not doing well or get worse. Document Released: 09/21/2005 Document Revised: 12/14/2011 Document Reviewed: 05/26/2013 [facility] Patient Information 2015 [place], [place]. This information is not intended to replace advice given to you by your health care provider. Make sure you discuss any questions you have with your health care provider.     Emergency Department Resource Guide 1) Find a Doctor and Pay Out of Pocket Although you won't have to find out who is covered by your insurance plan, it is a good idea to ask around and get recommendations. You will then need to call the office and see if the doctor you have chosen will accept you as a new patient and what types of options they offer for patients who are self-pay. Some doctors offer discounts or will set up payment plans for their patients who do not have insurance, but you will need to ask so you aren't surprised when you get to your appointment.  2) Contact Your Local Health Department Not all health departments have doctors that can see patients for sick visits, but many do, so it is worth a call to see if  yours does. If you don't know where your local health department is, you can check in your phone book. The CDC also has a tool to help you locate your state's health department, and many state websites also have listings of all of their local health departments.  3) Find a [facility] If your illness is not likely to be very severe or complicated, you may want to try a walk in clinic. These are popping up all over the country in pharmacies, drugstores, and shopping centers. They're usually  staffed by nurse practitioners or physician assistants that have been trained to treat common illnesses and complaints. They're usually fairly quick and inexpensive. However, if you have serious medical issues or chronic medical problems, these are probably not your best option.  No Primary Care Doctor: - Call Health Connect at  [phone] - they can help you locate a primary care doctor that  accepts your insurance, provides certain services, etc. - Physician Referral Service- [phone]  Chronic Pain Problems: Organization         Address  Phone   Notes  [facility]  [phone] Patients need to be referred by their primary care doctor.   Medication Assistance: Organization         Address  Phone   Notes  [facility] Medication [facility] [address] [place], [place] [address] [phone] --Must be a resident of [facility] -- Must have NO insurance coverage whatsoever (no Medicaid/ Medicare, etc.) -- The pt. MUST have a primary care doctor that directs their care regularly and follows them in the community   [facility]ist  [phone]   [employer]  [phone]    Agencies that provide inexpensive medical care: Organization         Address  Phone   Notes  [place]  [phone]   [facility]    [phone]   [facility] [place], [place] [address] [phone]   [place] [address] [place] [phone]   Planned Parenthood    ([phone]   [facility]    [phone]   [place] and [place] [address], [place] Phone:  [phone], Fax:  [phone] Hours of Operation:  9 am - 6 pm, M-F.  Also accepts Medicaid/Medicare and self-pay.  [facility] for [place], [address], [place] Phone: [phone], Fax: [phone]. Hours of  Operation:  8:30 am - 5:30 pm, M-F.  Also accepts Medicaid and self-pay.  [facility] [place] [address], [place] Phone: [phone]   [place], [place], [place] [phone], Ext. [phone] Mondays & Thursdays: 7-9 AM.  First 15 patients are seen on a first come, first serve basis.    [place] Providers:  Organization         Address  Phone   Notes  [facility] [address], [place] [phone] Also accepts self-pay patients.  [place], [place]  [phone]   [place], [address], [place] [phone]   [place] [address] [place] [phone]   Lucianne Lei [address], [place]   [phone])  [phone] Only accepts [place] Access Medicaid patients after they have their name applied to their card.   Self-Pay (no insurance) in [facility]:  Organization         Address  Phone   Notes  Sickle Cell Patients, [facility] [place] [phone]   [facility] [place] [phone]   [facility] [place]  [place], [place], [place] [phone]   [facility]/Dr. Osei-Bonsu  [address] [place] or [address], [place] [phone] Phone number for both [place] and [place] locations is the same.  Urgent Medical and [facility] [address] [place] [phone]   [facility] [address] [place] or [address] [phone]   [facility] [address] [place] [phone], phone; [phone], fax Sees patients 1st and 3rd Saturday of every month.  Must not qualify for public or private insurance (i.e. Medicaid, Medicare,  [place] Health Choice, Veterans' Benefits)  Household income should be no more than 200% of the poverty level The clinic cannot treat you if you are pregnant or think you are pregnant  Sexually transmitted diseases are not treated at the clinic.    Dental Care: Organization         Address  Phone  Notes  [facility] Department of [facility] [place] [phone] Accepts children up to age 31 who are enrolled in [place] or [place]; pregnant women with a Medicaid card; and children who have applied for Medicaid or [place] Health Choice, but were declined, whose parents can pay a reduced fee at time of service.  [facility] Department of [facility]  [address], [place] [phone] Accepts children up to age 65 who are enrolled in [place] or [place]; pregnant women with a Medicaid card; and children who have applied for Medicaid or [place] Health Choice, but were declined, whose parents can pay a reduced fee at time of service.  [place] Adult [facility] Access PROGRAM  [place] [phone] Patients are seen by appointment only. Walk-ins are not accepted. [place] will see patients 2 years of age and older. Monday - Tuesday (8am-5pm) Most Wednesdays (8:30-5pm) $30 per visit, cash only  [facility] Adult Dental Access PROGRAM  [address], [facility] [phone] Patients are seen by appointment only. Walk-ins are not accepted. [place] will see patients 76 years of age and older. One Wednesday Evening (Monthly: Volunteer Based).  $30 per visit, cash only  [place]  [phone] for adults; Children under age 14, call Graduate Pediatric Dentistry at [phone]. Children aged 52-14, please call [phone] to request a pediatric application.  Dental services are provided in all areas of dental care including fillings, crowns and bridges,  complete and partial dentures, implants, gum treatment, root canals, and extractions. Preventive care is also provided. Treatment is provided to both adults and children. Patients are selected via a lottery and there is often a waiting list.   [facility] [address] [place]  [phone] [email or web address], [place], [place] [phone], Ext. [phone] Second and Fourth Thursday of each month, opens at 6:30 AM;  Clinic ends at 9 AM.  Patients are seen on a first-come first-served basis, and a limited number are seen during each clinic.   [facility]  [address] Hillard Danker [place], [place] [phone]   Eligibility Requirements You must have lived in [place], [place], or [place] counties for at least the last three months.   You cannot be eligible for state or federal sponsored [employer], including [employer], [place], or [employer].   You generally cannot be eligible for healthcare insurance through your employer.    How to apply: Eligibility screenings are held every Tuesday and Wednesday afternoon from 1:00 pm until 4:00 pm. You do not need an appointment for the interview!  [facility] [address] [place], [place]   [place]  [place] Department  [place]  [phone]    Behavioral Health Resources in the Community: Intensive Outpatient Programs Organization         Address  Phone  Notes  [place]. [address] [place], [place] [phone]   [facility] Outpatient [address], [place], [place]   ADS: Alcohol & Drug Svcs [address], [place], [place]   [place] [address],  [place], [place] or [phone]   Substance Abuse Resources Organization          Address  Phone  Notes  Alcohol and Drug Services  [phone]   [place]  [phone]   [facility] [place]   Chinita Pester  [phone]   Residential & Outpatient Substance Abuse Program  [phone]   Psychological Services Organization         Address  Phone  Notes  [facility] [place]  [place]  [phone]   [place] [address] [place] or [phone]    Mobile Crisis Teams Organization         Address  Phone  Notes  [facility], Mobile Crisis Care Unit  [phone]   [facility]  [address] [place], [place]   [place] [address], [place] [phone]    Self-Help/Support Groups Organization         Address  Phone             Notes  [place]. of [place] - variety of support groups  [place] Call for more information  Narcotics Anonymous (NA), [facility] [address], [employer] [place]  2 meetings at this location   Special educational needs teacher         Address  Phone  Notes  [facility] [place],    [place]  [phone]   [facility]  [address] [place] [id number], [place], [place]   [place], [place] [phone] Admissions: 8am-3pm M-F  Incentives Substance [place] [address]    [place], [place] [phone]   [facility] [address] Jadene Pierini [place], [place]   [facility] [address],  [place], [place]   [facility]ight Programs - Intensive Outpatient [address]een [address], [place], [place]   [facility] ([place] [address] [place], [place] or [phone]  [facility] Services (RTS) [address] [place], [place] Accepts Medicaid  [facility] [place] [address],  [place] [phone] Substance Abuse/Addiction Treatment   [facility] Organization         Address  Phone  Notes  [facility]  [phone]   Domenic Schwab, PhD [address] Arlis Porta [place], [place]   [phone] or [phone]   [place], [place] [phone]   [place] [address], [place], [place] [phone] Insurance/Medicaid/sponsorship through [facility] and Families [address] [place]                                    [place], [place] [phone] [place] [address], [place] [phone]    Dr. Adele Schilder  [phone]   [facility] Dept. 1) [address], [place] 2) [place] 3)  [address], [place] [phone]  [phone]   [place] [phone] or [phone] (After Hours)

## 2014-04-24 NOTE — Progress Notes (Signed)
[facility] provided pt with a list of primary care resources and a [place], application to help patient establish a pcp.

## 2014-04-27 ENCOUNTER — Emergency Department (INDEPENDENT_AMBULATORY_CARE_PROVIDER_SITE_OTHER)
Admission: EM | Admit: 2014-04-27 | Discharge: 2014-04-27 | Disposition: A | Discharge status: Home / Self Care | Payer: Self-pay | Source: Home / Self Care | Attending: Family Medicine | Admitting: Family Medicine

## 2014-04-27 ENCOUNTER — Encounter (HOSPITAL_COMMUNITY): Payer: Self-pay | Admitting: Emergency Medicine

## 2014-04-27 DIAGNOSIS — M5432 Sciatica, left side: Secondary | ICD-10-CM

## 2014-04-27 DIAGNOSIS — M5430 Sciatica, unspecified side: Secondary | ICD-10-CM

## 2014-04-27 MED ORDER — HYDROCODONE-ACETAMINOPHEN 5-325 MG PO TABS: 1.0000 | ORAL_TABLET | Freq: Four times a day (QID) | ORAL | Status: DC | PRN

## 2014-04-27 MED ORDER — PREDNISONE 10 MG PO KIT: PACK | ORAL | Status: DC

## 2014-04-27 NOTE — ED Provider Notes (Signed)
Sydney [name] is a 43 y.o. female who presents to Urgent Care today for left leg pain. Patient has had a 1-1/2 week history of left leg pain. She notes severe left leg pain worse with lumbar forward flexion sneezing coughing. She denies any weakness or numbness bowel bladder dysfunction. She was seen in the emergency department several days ago were duplex Doppler assessment of the left lower extremity for did not show DVT. She denies any injury.   Past Medical History  Diagnosis Date  . Hypertension   . 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 Blindness of left eye     congenital   History  Substance Use Topics  . Smoking status: Current Every Day Smoker -- 1.00 packs/day    Types: Cigarettes  . Smokeless tobacco: Not on file  . Alcohol Use: Yes   ROS as above Medications: No current facility-administered medications for this encounter.   Current Outpatient Prescriptions  Medication Sig Dispense Refill  . amLODipine (NORVASC) 10 MG tablet Take 1 tablet (10 mg total) by mouth daily.  30 tablet  0  . Aspirin-Salicylamide-Caffeine (BC HEADACHE POWDER PO) Take 1 packet by mouth 2 (two) times daily as needed (for pain).      [place] HYDROcodone-acetaminophen (NORCO/VICODIN) 5-325 MG per tablet Take 1 tablet by mouth every 6 (six) hours as needed.  15 tablet  0  . ibuprofen (ADVIL,MOTRIN) 800 MG tablet Take 1 tablet (800 mg total) by mouth every 8 (eight) hours as needed for mild pain.  30 tablet  0  . PredniSONE 10 MG KIT 12 day dose pack po  1 kit  0    Exam:  BP 134/92  Pulse 84  Temp(Src) 99.4 F (37.4 C) (Oral)  Resp 18  SpO2 100%  LMP 04/20/2014 Gen: Well NAD HEENT: EOMI,  MMM Lungs: Normal work of breathing. CTABL Heart: RRR no MRG Abd: NABS, Soft. Nondistended, Nontender Exts: Brisk capillary refill, warm and well perfused.  Left leg: Normal-appearing nontender full range of motion capillary refill and sensation are intact Back: Nontender  spinal midline. Left leg pain with forward flexion Positive left straight leg raise test negative right Reflexes sensation and strength are intact distally bilateral lower extremity  No results found for this or any previous visit (from the past 24 hour(s)). No results found.  Assessment and Plan: 43 y.o. female with left-sided sciatica. Pain is severe. Plan to treat with prednisone Dosepak and Norco. Referred to sports medicine Center. Discussed neurologic precautions for sciatica.  Discussed warning signs or symptoms. Please see discharge instructions. Patient expresses understanding.   This note was created using Systems analyst. Any transcription errors are unintended.    Gregor Hams, MD 04/27/14 2029

## 2014-04-27 NOTE — ED Notes (Signed)
C/o left leg pain States she was seen at [facility] and was told to see PCP to get veins checked  Here to get veins checked or she wants pain meds

## 2014-04-27 NOTE — Discharge Instructions (Signed)
Thank you for coming in today. Come back or go to the emergency room if you notice new weakness new numbness problems walking or bowel or bladder problems. Follow up with the sports medicine center.   Please call or see Ms Cheryle Horsfall for assistance with your bill.  You may qualify for reduced or free services.  Her phone number is [phone]. Her email is [email or web address]  Sciatica Sciatica is pain, weakness, numbness, or tingling along the path of the sciatic nerve. The nerve starts in the lower back and runs down the back of each leg. The nerve controls the muscles in the lower leg and in the back of the knee, while also providing sensation to the back of the thigh, lower leg, and the sole of your foot. Sciatica is a symptom of another medical condition. For instance, nerve damage or certain conditions, such as a herniated disk or bone spur on the spine, pinch or put pressure on the sciatic nerve. This causes the pain, weakness, or other sensations normally associated with sciatica. Generally, sciatica only affects one side of the body. CAUSES   Herniated or slipped disc.  Degenerative disk disease.  A pain disorder involving the narrow muscle in the buttocks (piriformis syndrome).  Pelvic injury or fracture.  Pregnancy.  Tumor (rare). SYMPTOMS  Symptoms can vary from mild to very severe. The symptoms usually travel from the low back to the buttocks and down the back of the leg. Symptoms can include:  Mild tingling or dull aches in the lower back, leg, or hip.  Numbness in the back of the calf or sole of the foot.  Burning sensations in the lower back, leg, or hip.  Sharp pains in the lower back, leg, or hip.  Leg weakness.  Severe back pain inhibiting movement. These symptoms may get worse with coughing, sneezing, laughing, or prolonged sitting or standing. Also, being overweight may worsen symptoms. DIAGNOSIS  Your caregiver will perform a physical  exam to look for common symptoms of sciatica. He or she may ask you to do certain movements or activities that would trigger sciatic nerve pain. Other tests may be performed to find the cause of the sciatica. These may include:  Blood tests.  X-rays.  Imaging tests, such as an MRI or CT scan. TREATMENT  Treatment is directed at the cause of the sciatic pain. Sometimes, treatment is not necessary and the pain and discomfort goes away on its own. If treatment is needed, your caregiver may suggest:  Over-the-counter medicines to relieve pain.  Prescription medicines, such as anti-inflammatory medicine, muscle relaxants, or narcotics.  Applying heat or ice to the painful area.  Steroid injections to lessen pain, irritation, and inflammation around the nerve.  Reducing activity during periods of pain.  Exercising and stretching to strengthen your abdomen and improve flexibility of your spine. Your caregiver may suggest losing weight if the extra weight makes the back pain worse.  Physical therapy.  Surgery to eliminate what is pressing or pinching the nerve, such as a bone spur or part of a herniated disk. HOME CARE INSTRUCTIONS   Only take over-the-counter or prescription medicines for pain or discomfort as directed by your caregiver.  Apply ice to the affected area for 20 minutes, 3-4 times a day for the first 48-72 hours. Then try heat in the same way.  Exercise, stretch, or perform your usual activities if these do not aggravate your pain.  Attend physical therapy sessions as directed by your caregiver.  Keep  all follow-up appointments as directed by your caregiver.  Do not wear high heels or shoes that do not provide proper support.  Check your mattress to see if it is too soft. A firm mattress may lessen your pain and discomfort. SEEK IMMEDIATE MEDICAL CARE IF:   You lose control of your bowel or bladder (incontinence).  You have increasing weakness in the lower back,  pelvis, buttocks, or legs.  You have redness or swelling of your back.  You have a burning sensation when you urinate.  You have pain that gets worse when you lie down or awakens you at night.  Your pain is worse than you have experienced in the past.  Your pain is lasting longer than 4 weeks.  You are suddenly losing weight without reason. MAKE SURE YOU:  Understand these instructions.  Will watch your condition.  Will get help right away if you are not doing well or get worse. Document Released: 09/15/2001 Document Revised: 03/22/2012 Document Reviewed: 01/31/2012 [facility] Patient Information 2015 [place], [place]. This information is not intended to replace advice given to you by your health care provider. Make sure you discuss any questions you have with your health care provider.

## 2014-05-10 ENCOUNTER — Encounter: Payer: Self-pay | Admitting: Sports Medicine

## 2014-05-10 ENCOUNTER — Ambulatory Visit (INDEPENDENT_AMBULATORY_CARE_PROVIDER_SITE_OTHER): Payer: Self-pay | Admitting: Sports Medicine

## 2014-05-10 VITALS — BP 148/112 | Ht 69.0 in | Wt 155.0 lb

## 2014-05-10 DIAGNOSIS — I10 Essential (primary) hypertension: Secondary | ICD-10-CM

## 2014-05-10 DIAGNOSIS — M5432 Sciatica, left side: Secondary | ICD-10-CM

## 2014-05-10 DIAGNOSIS — M5430 Sciatica, unspecified side: Secondary | ICD-10-CM

## 2014-05-10 NOTE — Patient Instructions (Addendum)
1. Follow up with your PCP for your elevated blood pressure. We are concerned about how high it is today.  2. Talk to Clarice Pole about the [employer]. Call us back if you are able to obtain this or insurance. We will consider need for physical therapy.  3. For your sciatica: Once your blood pressure comes down, we would like you take an anti-inflammatory. If your pain returns, call us. We will need to obtain imaging.   4. You may return to work on Monday. Note provided.

## 2014-05-10 NOTE — Progress Notes (Signed)
Sydney [name] - 43 y.o. female MRN [id number]  Date of birth: [birth year]  SUBJECTIVE:  Including CC & ROS.  Chief Complaint  Patient presents with  . Sciatica    L>R    Patient notes that her sciatica started about 3 months ago while at work. She works as a Quarry manager. No specific inciting injury. Symptoms started with pain in the right low back, which radiated to her lower abdomen. Initially, she thought she had a UTI. She was evaluated and her urine culture was negative, per report. Eventually, pain started to radiate down the lateral aspect of her thigh, posterior lower leg and down to the plantar surface of her foot. About 6 weeks ago, she started having more pain in her left lower back. This pain is similar in character. It is a cramping sensation with associated numbness that radiates down the lateral aspect of her thigh, calf and the plantar surface of her entire foot. Pain is worse with bending forward and standing. No difference between sitting or standing. No associated weakness.   She was evaluated in the ER a couple weeks ago and told that she had sciatica. She was prescribed a 12 day prednisone taper, which she completed yesterday. Today, she is feeling much better. She still has some cramping discomfort in her left calf.   No fever, chills. No loss of bowel or bladder function. No saddle anesthesia.     HISTORY: Past Medical, Surgical, Social, and Family History Reviewed & Updated per EMR.   Pertinent Historical Findings include: Uncontrolled HTN Smokes 1 PPD.  Works as a Marine scientist  PHYSICAL EXAM:  VS: BP:148/112 mmHg  HR: bpm  TEMP: ( )  RESP:   HT:5' 9"  (175.3 cm)   WT:155 lb (70.308 kg)  BMI:22.9 PHYSICAL EXAM: Nursing notes and vitals reviewed. Gen: well appearing, NAD Neuro: normal gail, normal toe walking, normal heel walking; sensation to light touch intact and equal BL; 2+ patellar and 1+ Achilles reflexes BL. 5/5 strength BLE: hip and knee flexion/extension, ankle  plantar and dorsiflexion, great toe extension  Pain in left calf with resisted extension of knee Positive SLR on the left (pain in gluteal region that radiates down to calf), negative on the right Back: Inspection: no gross deformity (no atrophy of LE muscle groups) Palpation: no midline tenderness (lumbar SP or TP); TTP most pronounced at left SI, left piriformis  ROM: limited forward flexion of lumbar spine 2/2 to pain; normal extension  Special Testing: See neuro above FABER and FADIR negative   ASSESSMENT & PLAN:   Ms. Mcneeley is a 43 yo AA female with a  PMH of HTN and tobacco use who presents today for evaluation of left-sided sciatica.   HTN - BP elevated to 140s/110s initially and on recheck. Patient has not taken her BP medication today. Discussed our concern about her blood pressure. Advised that she take her medication. We scheduled a follow up with her PCP tomorrow.   Sciatica, left - History and exam consistent with radiculopathy at the S1 level. Symptoms significantly improved with prednisone, which she completed yesterday.  - as her symptoms have improved, we do not recommend imaging at this time - ideally, would start 7 days of NSAID and PT, but patient's blood pressure is elevated and she does not have insurance - if her symptoms return, we advised her to return. Will consider appropriate imaging (possibly MRI to help determine specific etiology and guide management) +/- repeat prednisone taper.   Social - see  patient instructions   Above discussed with patient who voiced understanding and is in agreement with the plan.   Susannah Lichtenstein, DO, HO-3

## 2014-06-06 ENCOUNTER — Emergency Department (HOSPITAL_COMMUNITY)
Admission: EM | Admit: 2014-06-06 | Discharge: 2014-06-06 | Disposition: A | Discharge status: Home / Self Care | Payer: Self-pay | Attending: Emergency Medicine | Admitting: Emergency Medicine

## 2014-06-06 ENCOUNTER — Encounter (HOSPITAL_COMMUNITY): Payer: Self-pay | Admitting: Emergency Medicine

## 2014-06-06 DIAGNOSIS — S058X9A Other injuries of unspecified eye and orbit, initial encounter: Secondary | ICD-10-CM | POA: Insufficient documentation

## 2014-06-06 DIAGNOSIS — M5430 Sciatica, unspecified side: Secondary | ICD-10-CM | POA: Insufficient documentation

## 2014-06-06 DIAGNOSIS — X58XXXA Exposure to other specified factors, initial encounter: Secondary | ICD-10-CM | POA: Insufficient documentation

## 2014-06-06 DIAGNOSIS — S0502XA Injury of conjunctiva and corneal abrasion without foreign body, left eye, initial encounter: Secondary | ICD-10-CM

## 2014-06-06 DIAGNOSIS — Y9289 Other specified places as the place of occurrence of the external cause: Secondary | ICD-10-CM | POA: Insufficient documentation

## 2014-06-06 DIAGNOSIS — Y9389 Activity, other specified: Secondary | ICD-10-CM | POA: Insufficient documentation

## 2014-06-06 DIAGNOSIS — F17200 Nicotine dependence, unspecified, uncomplicated: Secondary | ICD-10-CM | POA: Insufficient documentation

## 2014-06-06 DIAGNOSIS — Z85028 Personal history of other malignant neoplasm of stomach: Secondary | ICD-10-CM | POA: Insufficient documentation

## 2014-06-06 DIAGNOSIS — I10 Essential (primary) hypertension: Secondary | ICD-10-CM | POA: Insufficient documentation

## 2014-06-06 DIAGNOSIS — H5710 Ocular pain, unspecified eye: Secondary | ICD-10-CM | POA: Insufficient documentation

## 2014-06-06 MED ORDER — TRAMADOL HCL 50 MG PO TABS: 50.0000 mg | ORAL_TABLET | Freq: Four times a day (QID) | ORAL | Status: DC | PRN

## 2014-06-06 MED ORDER — FLUORESCEIN SODIUM 1 MG OP STRP
1.0000 | ORAL_STRIP | Freq: Once | OPHTHALMIC | Status: AC
  Administered 2014-06-06: 08:00:00 via OPHTHALMIC
  Filled 2014-06-06: qty 1

## 2014-06-06 MED ORDER — NEOMYCIN-POLYMYXIN-DEXAMETH 3.5-10000-0.1 OP OINT
TOPICAL_OINTMENT | Freq: Four times a day (QID) | OPHTHALMIC | Status: DC
  Administered 2014-06-06: 08:00:00 via OPHTHALMIC
  Filled 2014-06-06: qty 3.5

## 2014-06-06 MED ORDER — TETRACAINE HCL 0.5 % OP SOLN
2.0000 [drp] | Freq: Once | OPHTHALMIC | Status: AC
  Administered 2014-06-06: 2 [drp] via OPHTHALMIC
  Filled 2014-06-06: qty 2

## 2014-06-06 MED ORDER — AMLODIPINE BESYLATE 10 MG PO TABS: 10.0000 mg | ORAL_TABLET | Freq: Every day | ORAL | Status: DC

## 2014-06-06 NOTE — ED Notes (Signed)
Kirichenko, PA at bedside for evaluation.

## 2014-06-06 NOTE — ED Notes (Signed)
Patient states that she woke up Tuesday morning with L eye red and " a lot of tears running".  Patient states the eye is painful, but "not any junk in it".  Patient also complains of sciatica pain and wants medication for that.

## 2014-06-06 NOTE — ED Provider Notes (Signed)
CSN: [id number]     Arrival date & time 06/06/14  9509 History   First MD Initiated Contact with Patient 06/06/14 [phone]     Chief Complaint  Patient presents with  . Eye Problem     (Consider location/radiation/quality/duration/timing/severity/associated sxs/prior Treatment) HPI Sydney [name] is a 43 y.o. female who presents to ED with complaint of eye pain. Patient states that her pain began yesterday morning where she will call. Pain is in the left thigh. States sensitive to light. Also admits to eye watering. States eye is red. No purulent discharge. No injuries. 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 Patient also states she's having bilateral sciatica symptoms, unable to see her primary care doctor, requesting medications. Patient also requesting blood pressure medication refill.  Past Medical History  Diagnosis Date  . Hypertension   . 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 Blindness of left eye     congenital   Past Surgical History  Procedure Laterality Date  . Tumor removal  2010    stromo tumor -  half of stomach removed  . Tubal ligation      was reversed   No family history on file. History  Substance Use Topics  . Smoking status: Current Every Day Smoker -- 1.00 packs/day    Types: Cigarettes  . Smokeless tobacco: Not on file  . Alcohol Use: Yes   OB History   Grav Para Term Preterm Abortions TAB SAB Ect Mult Living                 Review of Systems  Constitutional: Negative for fever and chills.  HENT: Negative for congestion.   Eyes: Positive for photophobia, pain, discharge and redness. Negative for itching.  Respiratory: Negative for cough, chest tightness and shortness of breath.   Cardiovascular: Negative for chest pain, palpitations and leg swelling.  Musculoskeletal: Positive for back pain. Negative for myalgias, neck pain and neck stiffness.  Skin: Negative for rash.  Neurological: Negative for  dizziness, weakness and headaches.  All other systems reviewed and are negative.     Allergies  Review of patient's allergies indicates no known allergies.  Home Medications   Prior to Admission medications   Not on File   BP 142/104  Pulse 90  Temp(Src) 98 F (36.7 C)  Resp 18  Ht 5' 9"  (1.753 m)  Wt 155 lb (70.308 kg)  BMI 22.88 kg/m2  SpO2 100%  LMP 06/01/2014 Physical Exam  Nursing note and vitals reviewed. Constitutional: She is oriented to person, place, and time. She appears well-developed and well-nourished. No distress.  HENT:  Head: Normocephalic.  Eyes: EOM and lids are normal. Pupils are equal, round, and reactive to light. Lids are everted and swept, no foreign bodies found. Right conjunctiva is not injected. Left conjunctiva is injected. Left conjunctiva has no hemorrhage.  Slit lamp exam:      The left eye shows corneal abrasion. The left eye shows no foreign body, no hyphema and no hypopyon.  Neck: Neck supple.  Cardiovascular: Normal rate, regular rhythm and normal heart sounds.   Pulmonary/Chest: Effort normal and breath sounds normal. No respiratory distress.  Musculoskeletal: She exhibits no edema.  No midline lumbar spine tenderness. Paine with bilateral straight leg raise  Neurological: She is alert and oriented to person, place, and time.  5/5 and equal lower extremity strength. 2+ and equal patellar reflexes bilaterally. Pt  able to dorsiflex bilateral toes and feet with good strength against resistance. Equal sensation bilaterally over thighs and lower legs.   Skin: Skin is warm and dry.    ED Course  Procedures (including critical care time) Labs Review Labs Reviewed - No data to display  Imaging Review No results found.   EKG Interpretation None      MDM   Final diagnoses:  Corneal abrasion, left, initial encounter    Pt legally blind in left eye since birth, states normally can see light and colors, states she is still able to  see light and colors. complaining of pain and watering of left eye with light sensitivity. Pressure in left eye measured, ave of 14. Fluorescein stain showing small corneal abrasion. Will treat with polytrim drops. Pt requesting pain meds for back pain.  No signs of cauda equina. No new injuries. No emergent imaging indicated. Will add ultram, given pt has BP issues, NSAIDs not indicated. Will refill BP meds, pt has been taking her fathers medications. Pt will follow up with [facility] and with ophthalmology.   Filed Vitals:   06/06/14 0727  BP: 142/104  Pulse: 90  Temp: 98 F (36.7 C)  Resp: 18       Jeromy Borcherding A Litzi Binning, PA-C 06/06/14 0805

## 2014-06-06 NOTE — ED Provider Notes (Signed)
Medical screening examination/treatment/procedure(s) were performed by non-physician practitioner and as supervising physician I was immediately available for consultation/collaboration.  Ernestina Patches, MD 06/06/14 1640

## 2014-06-06 NOTE — Discharge Instructions (Signed)
Apply eye drops 3-4 times a day. Take your blood pressure medications. Take ultram for severe pain. Follow up with primary care doctor. Follow up with eye doctor as referred if eye pain not improving.    Corneal Abrasion The cornea is the clear covering at the front and center of the eye. When looking at the colored portion of the eye (iris), you are looking through the cornea. This very thin tissue is made up of many layers. The surface layer is a single layer of cells (corneal epithelium) and is one of the most sensitive tissues in the body. If a scratch or injury causes the corneal epithelium to come off, it is called a corneal abrasion. If the injury extends to the tissues below the epithelium, the condition is called a corneal ulcer. CAUSES   Scratches.  Trauma.  Foreign body in the eye. Some people have recurrences of abrasions in the area of the original injury even after it has healed (recurrent erosion syndrome). Recurrent erosion syndrome generally improves and goes away with time. SYMPTOMS   Eye pain.  Difficulty or inability to keep the injured eye open.  The eye becomes very sensitive to light.  Recurrent erosions tend to happen suddenly, first thing in the morning, usually after waking up and opening the eye. DIAGNOSIS  Your health care provider can diagnose a corneal abrasion during an eye exam. Dye is usually placed in the eye using a drop or a small paper strip moistened by your tears. When the eye is examined with a special light, the abrasion shows up clearly because of the dye. TREATMENT   Small abrasions may be treated with antibiotic drops or ointment alone.  A pressure patch may be put over the eye. If this is done, follow your doctor's instructions for when to remove the patch. Do not drive or use machines while the eye patch is on. Judging distances is hard to do with a patch on. If the abrasion becomes infected and spreads to the deeper tissues of the cornea, a  corneal ulcer can result. This is serious because it can cause corneal scarring. Corneal scars interfere with light passing through the cornea and cause a loss of vision in the involved eye. HOME CARE INSTRUCTIONS  Use medicine or ointment as directed. Only take over-the-counter or prescription medicines for pain, discomfort, or fever as directed by your health care provider.  Do not drive or operate machinery if your eye is patched. Your ability to judge distances is impaired.  If your health care provider has given you a follow-up appointment, it is very important to keep that appointment. Not keeping the appointment could result in a severe eye infection or permanent loss of vision. If there is any problem keeping the appointment, let your health care provider know. SEEK MEDICAL CARE IF:   You have pain, light sensitivity, and a scratchy feeling in one eye or both eyes.  Your pressure patch keeps loosening up, and you can blink your eye under the patch after treatment.  Any kind of discharge develops from the eye after treatment or if the lids stick together in the morning.  You have the same symptoms in the morning as you did with the original abrasion days, weeks, or months after the abrasion healed. MAKE SURE YOU:   Understand these instructions.  Will watch your condition.  Will get help right away if you are not doing well or get worse. Document Released: 09/18/2000 Document Revised: 09/26/2013 Document Reviewed: 05/29/2013  Exit[employer] Patient Information 2015 [place]. This information is not intended to replace advice given to you by your health care provider. Make sure you discuss any questions you have with your health care provider.

## 2014-10-10 ENCOUNTER — Encounter: Payer: Self-pay | Admitting: Medical

## 2014-10-10 ENCOUNTER — Ambulatory Visit (INDEPENDENT_AMBULATORY_CARE_PROVIDER_SITE_OTHER): Payer: 59 | Admitting: Medical

## 2014-10-10 VITALS — BP 138/98 | HR 74 | Temp 98.4°F | Resp 16 | Ht 67.5 in | Wt 176.0 lb

## 2014-10-10 DIAGNOSIS — M549 Dorsalgia, unspecified: Secondary | ICD-10-CM

## 2014-10-10 DIAGNOSIS — G8929 Other chronic pain: Secondary | ICD-10-CM

## 2014-10-10 DIAGNOSIS — Z8509 Personal history of malignant neoplasm of other digestive organs: Secondary | ICD-10-CM

## 2014-10-10 DIAGNOSIS — I10 Essential (primary) hypertension: Secondary | ICD-10-CM

## 2014-10-10 DIAGNOSIS — M79606 Pain in leg, unspecified: Secondary | ICD-10-CM

## 2014-10-10 DIAGNOSIS — Z720 Tobacco use: Secondary | ICD-10-CM

## 2014-10-10 DIAGNOSIS — R109 Unspecified abdominal pain: Secondary | ICD-10-CM

## 2014-10-10 DIAGNOSIS — D259 Leiomyoma of uterus, unspecified: Secondary | ICD-10-CM

## 2014-10-10 DIAGNOSIS — F17200 Nicotine dependence, unspecified, uncomplicated: Secondary | ICD-10-CM

## 2014-10-10 LAB — POCT URINALYSIS DIPSTICK
BILIRUBIN UA: NEGATIVE
GLUCOSE UA: NEGATIVE
KETONES UA: NEGATIVE
Leukocytes, UA: NEGATIVE
NITRITE UA: NEGATIVE
PROTEIN UA: NEGATIVE
Spec Grav, UA: 1.025
Urobilinogen, UA: NEGATIVE
pH, UA: 6

## 2014-10-10 LAB — POCT URINE PREGNANCY: Preg Test, Ur: NEGATIVE

## 2014-10-10 MED ORDER — NAPROXEN 375 MG PO TABS
375.0000 mg | ORAL_TABLET | Freq: Two times a day (BID) | ORAL | Status: DC
Start: 2014-10-10 — End: 2014-12-26

## 2014-10-10 MED ORDER — HYDROCHLOROTHIAZIDE 25 MG PO TABS: 25.0000 mg | ORAL_TABLET | Freq: Every day | ORAL | Status: DC

## 2014-10-10 MED ORDER — AMLODIPINE BESYLATE 10 MG PO TABS
10.0000 mg | ORAL_TABLET | Freq: Every day | ORAL | Status: DC
Start: 2014-10-10 — End: 2014-10-31

## 2014-10-10 NOTE — Progress Notes (Signed)
Subjective: Here as a new patient today.  Right off the bat she notes that she has numerous concerns and problems.  She has a history of hypertension, diagnosed 44 year old.  Due to being out of work and no income has been using a family members amlodipine. Takes amlodipine 10 mg daily, blood pressures are running high though.  Denies chest pain, swelling in legs, palpitations, headaches.  Denies any recollection of secondary causes of hypertension found.  Has a history of a GI stromal tumor which was surgically removed, did chemotherapy for a couple months but due to moving out of state was lost to follow-up. Diagnosed in 2010. Was seeing oncology up in [place] for period time, [place] area. Needs follow-up on this.  Her main concern today is chronic back pain.  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  She recalls transferring a patient from the bed to a wheelchair, didn't have pain immediately or for a few days but then started getting pain several days later.  She says she didn't think to report the injury to her employer at the time but after pain was continuing for more than 2 weeks, she ended up going to the emergency department for evaluation.  She felt like there was minimal evaluation and no x-ray was done.  Over the last year she has had ongoing pain, constant pain in her legs, pain in her back, activity is difficult due to pain, has pain caring items, walking, sitting around causes pain.  She denies numbness or tingling in the legs, no weakness. Denies blood in urine or stool.  No incontinence, no saddle anesthesia.   Weight fluctuates but no reported weight loss.   No fever, no night sweats.   Currently just deals with the pain, sits around all day, not working, has to rely on family members to provide her groceries and rent, etc.  She saw a Dr. Micheline Chapman in [place] during the summer 2015.  Here today to  get further evaluation and treatment of the back pain. She is a smoker.  Last menstrual period: Currently, not currently sexually active, no current birth control other than condoms  Review of Systems Constitutional: -fever, -chills, -sweats, -unexpected weight change,-fatigue Cardiology:  -chest pain, -palpitations, -edema Respiratory: -cough, -shortness of breath, -wheezing Gastroenterology: -abdominal pain, -nausea, -vomiting, -diarrhea, +constipation  Hematology: -bleeding or bruising problems Ophthalmology: -vision changes Urology: -dysuria, -difficulty urinating, -hematuria, -urinary frequency, -urgency Neurology: -headache, -weakness, -tingling, -numbness   Objective: BP 138/98 mmHg  Pulse 74  Temp(Src) 98.4 F (36.9 C) (Oral)  Resp 16  Ht 5' 7.5" (1.715 m)  Wt 176 lb (79.833 kg)  BMI 27.14 kg/m2  General appearance: alert, no distress, WD/WN, AA female Psychiatric: somewhat loud and animated, but pleasant, answers questions appropriately, well dressed Neck: supple, no lymphadenopathy, no thyromegaly, no masses Heart: RRR, normal S1, S2, no murmurs Lungs: CTA bilaterally, no wheezes, rhonchi, or rales Abdomen: +bs, soft, long vertical surgical scar from epigastric to mid abdomen area, there is fullness and mass in suprapubic area suggestive of fibroid uterus, mild tenderness in same area, otherwise non tender, non distended, no hepatomegaly, no splenomegaly Back: tender lumbar region generalized, pain with flexion and extension, no deformity, normal heel and toe walk, Musculoskeletal: Legs nontender, no swelling, no obvious deformity, normal range of motion Extremities: no edema, no cyanosis, no clubbing Pulses: 1+ symmetric, upper and lower extremities, normal cap refill Neurological:  alert, oriented x 3, CN2-12 intact, strength normal upper extremities and lower extremities, sensation normal throughout, DTRs 2+ throughout, no cerebellar signs, gait normal, negative  SLR  Assessment: Encounter Diagnoses  Name Primary?  . Chronic back pain Yes  . Chronic leg pain, unspecified laterality   . Essential hypertension   . Abdominal pain, unspecified abdominal location   . History of malignant gastrointestinal stromal tumor (GIST)   . Uterine leiomyoma, unspecified location   . Smoker    Plan: Chronic back pain, chronic leg pain-I have no prior records to evaluate.  I suspect musculoskeletal pain, spasm, but given the chronic nature of this will send for lumbar x-ray.  No obvious signs of claudication although she is a smoker.  Can use Naprosyn for the time being, stretching.  Hypertension-continue amlodipine which I refilled today and begin hydrochlorothiazide. Discussed risk and benefits of medication, follow-up soon for a physical and labs  Abdominal pain-she appears to have a fibroid uterus or other mass in the pelvis, she will return soon for physical and labs and additional evaluation.    History of GI tumor-she will return for physical and labs and will likely have subsequent referral  Smoker-advise she consider smoking cessation. Not ready to quit.

## 2014-10-16 ENCOUNTER — Ambulatory Visit
Admission: RE | Admit: 2014-10-16 | Discharge: 2014-10-16 | Disposition: A | Discharge status: Home / Self Care | Payer: 59 | Source: Ambulatory Visit | Attending: Medical | Admitting: Medical

## 2014-10-16 DIAGNOSIS — M549 Dorsalgia, unspecified: Principal | ICD-10-CM

## 2014-10-16 DIAGNOSIS — G8929 Other chronic pain: Secondary | ICD-10-CM

## 2014-10-31 ENCOUNTER — Other Ambulatory Visit (HOSPITAL_COMMUNITY)
Admission: RE | Admit: 2014-10-31 | Discharge: 2014-10-31 | Disposition: A | Discharge status: Home / Self Care | Payer: 59 | Source: Ambulatory Visit | Attending: Medical | Admitting: Medical

## 2014-10-31 ENCOUNTER — Encounter: Payer: Self-pay | Admitting: Medical

## 2014-10-31 ENCOUNTER — Telehealth: Payer: Self-pay | Admitting: Medical

## 2014-10-31 ENCOUNTER — Ambulatory Visit (INDEPENDENT_AMBULATORY_CARE_PROVIDER_SITE_OTHER): Payer: 59 | Admitting: Medical

## 2014-10-31 VITALS — BP 122/90 | HR 100 | Temp 98.7°F | Resp 16 | Ht 69.0 in | Wt 176.0 lb

## 2014-10-31 DIAGNOSIS — Z23 Encounter for immunization: Secondary | ICD-10-CM

## 2014-10-31 DIAGNOSIS — F17200 Nicotine dependence, unspecified, uncomplicated: Secondary | ICD-10-CM

## 2014-10-31 DIAGNOSIS — Z01419 Encounter for gynecological examination (general) (routine) without abnormal findings: Secondary | ICD-10-CM | POA: Insufficient documentation

## 2014-10-31 DIAGNOSIS — M79605 Pain in left leg: Secondary | ICD-10-CM

## 2014-10-31 DIAGNOSIS — K5900 Constipation, unspecified: Secondary | ICD-10-CM

## 2014-10-31 DIAGNOSIS — Z1239 Encounter for other screening for malignant neoplasm of breast: Secondary | ICD-10-CM

## 2014-10-31 DIAGNOSIS — I10 Essential (primary) hypertension: Secondary | ICD-10-CM

## 2014-10-31 DIAGNOSIS — Z1151 Encounter for screening for human papillomavirus (HPV): Secondary | ICD-10-CM | POA: Diagnosis present

## 2014-10-31 DIAGNOSIS — G8929 Other chronic pain: Secondary | ICD-10-CM

## 2014-10-31 DIAGNOSIS — M549 Dorsalgia, unspecified: Secondary | ICD-10-CM

## 2014-10-31 DIAGNOSIS — Z8509 Personal history of malignant neoplasm of other digestive organs: Secondary | ICD-10-CM

## 2014-10-31 DIAGNOSIS — Z720 Tobacco use: Secondary | ICD-10-CM

## 2014-10-31 DIAGNOSIS — Z124 Encounter for screening for malignant neoplasm of cervix: Secondary | ICD-10-CM

## 2014-10-31 DIAGNOSIS — Z0000 Encounter for general adult medical examination without abnormal findings: Secondary | ICD-10-CM

## 2014-10-31 DIAGNOSIS — D259 Leiomyoma of uterus, unspecified: Secondary | ICD-10-CM

## 2014-10-31 DIAGNOSIS — R21 Rash and other nonspecific skin eruption: Secondary | ICD-10-CM

## 2014-10-31 LAB — POCT URINALYSIS DIPSTICK
BILIRUBIN UA: NEGATIVE
Glucose, UA: NEGATIVE
KETONES UA: NEGATIVE
LEUKOCYTES UA: NEGATIVE
Nitrite, UA: NEGATIVE
Protein, UA: NEGATIVE
RBC UA: NEGATIVE
Spec Grav, UA: 1.025
Urobilinogen, UA: NEGATIVE
pH, UA: 6

## 2014-10-31 LAB — CBC
HCT: 40.1 % (ref 36.0–46.0)
HEMOGLOBIN: 12.5 g/dL (ref 12.0–15.0)
MCH: 25.1 pg — AB (ref 26.0–34.0)
MCHC: 31.2 g/dL (ref 30.0–36.0)
MCV: 80.5 fL (ref 78.0–100.0)
MPV: 9.1 fL (ref 8.6–12.4)
PLATELETS: 310 10*3/uL (ref 150–400)
RBC: 4.98 MIL/uL (ref 3.87–5.11)
RDW: 16.2 % — AB (ref 11.5–15.5)
WBC: 8.6 10*3/uL (ref 4.0–10.5)

## 2014-10-31 LAB — TSH: TSH: 1.48 u[IU]/mL (ref 0.350–4.500)

## 2014-10-31 LAB — BASIC METABOLIC PANEL
BUN: 13 mg/dL (ref 6–23)
CALCIUM: 9.1 mg/dL (ref 8.4–10.5)
CO2: 26 mEq/L (ref 19–32)
Chloride: 104 mEq/L (ref 96–112)
Creat: 0.79 mg/dL (ref 0.50–1.10)
GLUCOSE: 79 mg/dL (ref 70–99)
POTASSIUM: 3.6 meq/L (ref 3.5–5.3)
SODIUM: 138 meq/L (ref 135–145)

## 2014-10-31 LAB — LIPID PANEL
CHOLESTEROL: 195 mg/dL (ref 0–200)
HDL: 59 mg/dL (ref >39)
LDL Cholesterol: 126 mg/dL — ABNORMAL HIGH (ref 0–99)
Total CHOL/HDL Ratio: 3.3 Ratio
Triglycerides: 52 mg/dL (ref <150)
VLDL: 10 mg/dL (ref 0–40)

## 2014-10-31 LAB — HEPATIC FUNCTION PANEL
ALBUMIN: 4.4 g/dL (ref 3.5–5.2)
AST: 14 U/L (ref 0–37)
Alkaline Phosphatase: 71 U/L (ref 39–117)
BILIRUBIN INDIRECT: 0.4 mg/dL (ref 0.2–1.2)
BILIRUBIN TOTAL: 0.5 mg/dL (ref 0.2–1.2)
Bilirubin, Direct: 0.1 mg/dL (ref 0.0–0.3)
Total Protein: 7.1 g/dL (ref 6.0–8.3)

## 2014-10-31 LAB — T4, FREE: Free T4: 0.92 ng/dL (ref 0.80–1.80)

## 2014-10-31 MED ORDER — TRIAMCINOLONE ACETONIDE 0.1 % EX CREA: 1.0000 "application " | TOPICAL_CREAM | Freq: Two times a day (BID) | CUTANEOUS | Status: DC

## 2014-10-31 MED ORDER — METHOCARBAMOL 500 MG PO TABS: 500.0000 mg | ORAL_TABLET | Freq: Three times a day (TID) | ORAL | Status: DC | PRN
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
## 2014-10-31 NOTE — Telephone Encounter (Signed)
Refer to physical therapy, screening mammogram

## 2014-11-02 LAB — CYTOLOGY - PAP

## 2014-11-02 NOTE — Telephone Encounter (Signed)
Work with her then

## 2014-11-02 NOTE — Telephone Encounter (Signed)
Pt wants to go back to her ortho doctor first which is Dr. Micheline Chapman before having PT. Pt was also given the [facility] # and location so she can call and schedule the appt on her own since the order is already in the computer.

## 2014-11-05 NOTE — Telephone Encounter (Signed)
Patient is aware of her appointment to see Dr. Micheline Chapman [address] [place], [place]

## 2014-11-07 ENCOUNTER — Encounter: Payer: Self-pay | Admitting: Family Medicine

## 2014-11-09 ENCOUNTER — Ambulatory Visit (INDEPENDENT_AMBULATORY_CARE_PROVIDER_SITE_OTHER): Payer: 59 | Admitting: Sports Medicine

## 2014-11-09 ENCOUNTER — Encounter: Payer: Self-pay | Admitting: Sports Medicine

## 2014-11-09 VITALS — BP 135/93 | HR 85 | Ht 69.0 in | Wt 176.0 lb

## 2014-11-09 DIAGNOSIS — M5440 Lumbago with sciatica, unspecified side: Secondary | ICD-10-CM

## 2014-11-09 MED ORDER — PREDNISONE (PAK) 10 MG PO TABS: ORAL_TABLET | Freq: Every day | ORAL | Status: DC

## 2014-11-09 NOTE — Progress Notes (Signed)
   Subjective:    Patient ID: JOHNAY MANO, female    DOB: [birth year], 44 y.o.   MRN: [id number]  HPI  Patient comes in today with persistent low back pain. She was last seen in our office back in August. Her pain had started 3 months prior to that visit. 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 She was initially evaluated in the emergency room and placed on a 12 day Sterapred Dosepak and by the time she saw me she was starting to feel better. However, her exam and history were concerning for a possible lumbar disc herniation but she was without insurance at that time. She has since acquired insurance. She was most recently evaluated by Chana Bode who ordered x-rays of her lumbar spine which are available for review. When I initially saw her in August she was having pain down the left leg into the left calf but since that time she has now begun to develop radiating pain into both legs. Low back pain is diffuse. It is worse with standing but tolerable with sitting. She denies any treatment since the Dosepak prescribed to her by the emergency room. No prior low back surgeries. No change in bowel or bladder.   Review of Systems     Objective:   Physical Exam   well-developed, well-nourished. No acute distress.  Lumbar spine: Pain with forward flexion. Painless extension. Diffuse tenderness to palpation along the lumbosacral area but nothing focal. Neurological exam: Positive straight leg raise on the left, equivocal straight leg raise on the right. Strength is 5/5 both lower extremities and reflexes are equal at the Achilles and patellar tendons bilaterally. Sensation is intact to light touch grossly. No atrophy.  X-rays of the lumbar spine show some mild degenerative disc disease at L5-S1 but otherwise unremarkable in regards to her lumbar spine.      Assessment & Plan:  Persistent low back pain-rule out spinal stenosis versus lumbar disc herniation  MRI to rule out spinal stenosis. 6 day  Sterapred Dosepak to take as directed. Phone follow-up after the MRI to discuss the results and delineate further treatment ([phone]).

## 2014-11-18 ENCOUNTER — Ambulatory Visit
Admission: RE | Admit: 2014-11-18 | Discharge: 2014-11-18 | Disposition: A | Discharge status: Home / Self Care | Payer: 59 | Source: Ambulatory Visit | Attending: Sports Medicine | Admitting: Sports Medicine

## 2014-11-18 DIAGNOSIS — M5440 Lumbago with sciatica, unspecified side: Secondary | ICD-10-CM

## 2014-11-20 ENCOUNTER — Ambulatory Visit
Admission: RE | Admit: 2014-11-20 | Discharge: 2014-11-20 | Disposition: A | Discharge status: Home / Self Care | Payer: 59 | Source: Ambulatory Visit | Attending: Medical | Admitting: Medical

## 2014-11-20 ENCOUNTER — Telehealth: Payer: Self-pay | Admitting: Sports Medicine

## 2014-11-20 DIAGNOSIS — Z1239 Encounter for other screening for malignant neoplasm of breast: Secondary | ICD-10-CM

## 2014-11-20 NOTE — Telephone Encounter (Signed)
  Spoke with the patient on the phone today after reviewing the MRI of her lumbar spine. 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 There is also a small shallow broad-based disc protrusion at L4-L5. Based on these findings I recommend surgical consultation with Dr.Dumonski at [facility]. Further workup and treatment will be per his discretion. Follow-up with me when necessary.

## 2014-11-21 ENCOUNTER — Other Ambulatory Visit: Payer: Self-pay | Admitting: *Deleted

## 2014-11-21 DIAGNOSIS — M545 Low back pain, unspecified: Secondary | ICD-10-CM

## 2014-11-21 NOTE — Progress Notes (Signed)
LM to [facility] 

## 2014-11-22 ENCOUNTER — Telehealth: Payer: Self-pay

## 2014-11-22 NOTE — Telephone Encounter (Signed)
Spoke with patient yesterday and advised her that her mammogram was normal.

## 2014-11-30 ENCOUNTER — Telehealth: Payer: Self-pay | Admitting: Family Medicine

## 2014-11-30 NOTE — Telephone Encounter (Signed)
-----   Message from Koren Bound, [place] sent at 11/22/2014  8:05 AM EST ----- This is shane's patient, that this needs to be completed on ----- Message -----    From: Laurey Arrow, RN    Sent: 11/21/2014   4:53 PM      To: Carlena Hurl, PA-C, Koren Bound, CMA  Pt needs a referral from her primary since she has [facility] compass ins. She needs to see Dr. Lynann Bologna at [facility] referral is already setup in the system.   You'll just need to call them with details and get the appt. Thanks!

## 2014-11-30 NOTE — Telephone Encounter (Signed)
Online referral to [facility] was done. I fax over everything to Dr. Laurena Bering office along with [facility] referral. The new patient coordinator will contact the patient for her appointment. Patient is aware of all the above information.

## 2014-12-07 ENCOUNTER — Telehealth: Payer: Self-pay | Admitting: Family Medicine

## 2014-12-07 ENCOUNTER — Telehealth: Payer: Self-pay | Admitting: Internal Medicine

## 2014-12-07 NOTE — Telephone Encounter (Signed)
PATIENT HAS AN APPOINTMENT WITH DR. Harvie Bridge ON 12/12/2014 @ [address] [place], [place] [address] [phone]  PATIENT IS AWARE OF THIS APPOINTMENT.

## 2014-12-07 NOTE — Telephone Encounter (Signed)
Pt states she is having elevated blood pressure. A few days ago it was 140/90 but has not checked it today. She is wanting to know If you wanted to up the dose of bp or change to a different med. She is concerned since she is having surgery at end of month and do not want to have issues with surgery

## 2014-12-07 NOTE — Telephone Encounter (Signed)
Pt states she is having elevated blood pressure. A few days ago it was 140/90 but has not checked it today. She is wanting to know  If you wanted to up the dose of bp or change to a different med. She is concerned since she is having surgery at end of month and do not want to have issues with surgery

## 2014-12-10 ENCOUNTER — Other Ambulatory Visit: Payer: Self-pay | Admitting: Orthopedic Surgery

## 2014-12-10 NOTE — Telephone Encounter (Signed)
Patient is aware of Dorothea Ogle PA message about checking bP and return for a follow up visit

## 2014-12-10 NOTE — Telephone Encounter (Signed)
Have her check blood pressures daily this week and return in 1 week with blood pressure numbers, likely we will add or change medications at that time

## 2014-12-17 NOTE — Pre-Procedure Instructions (Signed)
Shawnice RUTHIE BERCH  12/17/2014   Your procedure is scheduled on: Wednesday, December 26, 2014   Report to [facility] Admitting at 10:00 AM.   Call this number if you have problems the morning of surgery: [phone]   Remember:   Do not eat food or drink liquids after midnight Tuesday, December 25, 2014    Take these medicines the morning of surgery with A SIP OF WATER: amLODipine (NORVASC)   Stop taking Aspirin, vitamins, and herbal medications. Do not take any NSAIDs ie: Ibuprofen, Advil, Naproxen or any medication containing Aspirin; stop 1 week prior to procedure ( Wednesday, December 19, 2014 )  Do not wear jewelry, make-up or nail polish.  Do not wear lotions, powders, or perfumes. You may not wear deodorant the day of surgery.  Do not shave underarms & legs 48 hours prior to surgery.    Do not bring valuables to the hospital.  [facility] is not responsible for any belongings or valuables.               Contacts, dentures or bridgework may not be worn into surgery.  Leave suitcase in the car. After surgery it may be brought to your room.  For patients admitted to the hospital, discharge time is determined by your treatment team.    Name and phone number of your driver:    Special Instructions:  Special Instructions:Special Instructions: [facility] - Preparing for Surgery  Before surgery, you can play an important role.  Because skin is not sterile, your skin needs to be as free of germs as possible.  You can reduce the number of germs on you skin by washing with CHG (chlorahexidine gluconate) soap before surgery.  CHG is an antiseptic cleaner which kills germs and bonds with the skin to continue killing germs even after washing.  Please DO NOT use if you have an allergy to CHG or antibacterial soaps.  If your skin becomes reddened/irritated stop using the CHG and inform your nurse when you arrive at Short Stay.  Do not shave (including legs and underarms) for at least  48 hours prior to the first CHG shower.  You may shave your face.  Please follow these instructions carefully:   1.  Shower with CHG Soap the night before surgery and the morning of Surgery.  2.  If you choose to wash your hair, wash your hair first as usual with your normal shampoo.  3.  After you shampoo, rinse your hair and body thoroughly to remove the Shampoo.  4.  Use CHG as you would any other liquid soap.  You can apply chg directly  to the skin and wash gently with scrungie or a clean washcloth.  5.  Apply the CHG Soap to your body ONLY FROM THE NECK DOWN.  Do not use on open wounds or open sores.  Avoid contact with your eyes, ears, mouth and genitals (private parts).  Wash genitals (private parts) with your normal soap.  6.  Wash thoroughly, paying special attention to the area where your surgery will be performed.  7.  Thoroughly rinse your body with warm water from the neck down.  8.  DO NOT shower/wash with your normal soap after using and rinsing off the CHG Soap.  9.  Pat yourself dry with a clean towel.            10.  Wear clean pajamas.  11.  Place clean sheets on your bed the night of your first shower and do not sleep with pets.  Day of Surgery  Do not apply any lotions/deodorants the morning of surgery.  Please wear clean clothes to the hospital/surgery center.   Please read over the following fact sheets that you were given: Pain Booklet, Coughing and Deep Breathing, Blood Transfusion Information, MRSA Information and Surgical Site Infection Prevention

## 2014-12-18 ENCOUNTER — Inpatient Hospital Stay (HOSPITAL_COMMUNITY)
Admission: RE | Admit: 2014-12-18 | Discharge: 2014-12-18 | Disposition: A | Discharge status: Home / Self Care | Payer: 59 | Source: Ambulatory Visit

## 2014-12-20 ENCOUNTER — Encounter (HOSPITAL_COMMUNITY)
Admission: RE | Admit: 2014-12-20 | Discharge: 2014-12-20 | Disposition: A | Discharge status: Home / Self Care | Payer: 59 | Source: Ambulatory Visit | Attending: Orthopedic Surgery | Admitting: Orthopedic Surgery

## 2014-12-20 ENCOUNTER — Encounter (HOSPITAL_COMMUNITY): Payer: Self-pay

## 2014-12-20 DIAGNOSIS — Z01818 Encounter for other preprocedural examination: Secondary | ICD-10-CM

## 2014-12-20 DIAGNOSIS — Z01810 Encounter for preprocedural cardiovascular examination: Secondary | ICD-10-CM | POA: Diagnosis present

## 2014-12-20 DIAGNOSIS — Z01812 Encounter for preprocedural laboratory examination: Secondary | ICD-10-CM | POA: Insufficient documentation

## 2014-12-20 HISTORY — DX: Unspecified osteoarthritis, unspecified site: M19.90

## 2014-12-20 LAB — URINALYSIS, ROUTINE W REFLEX MICROSCOPIC
Bilirubin Urine: NEGATIVE
GLUCOSE, UA: NEGATIVE mg/dL
Hgb urine dipstick: NEGATIVE
KETONES UR: NEGATIVE mg/dL
LEUKOCYTES UA: NEGATIVE
Nitrite: NEGATIVE
PH: 5.5 (ref 5.0–8.0)
Protein, ur: NEGATIVE mg/dL
Specific Gravity, Urine: 1.026 (ref 1.005–1.030)
UROBILINOGEN UA: 0.2 mg/dL (ref 0.0–1.0)

## 2014-12-20 LAB — COMPREHENSIVE METABOLIC PANEL
ALBUMIN: 3.9 g/dL (ref 3.5–5.2)
ALK PHOS: 60 U/L (ref 39–117)
ALT: 9 U/L (ref 0–35)
AST: 6 U/L (ref 0–37)
Anion gap: 6 (ref 5–15)
BUN: 11 mg/dL (ref 6–23)
CALCIUM: 9.4 mg/dL (ref 8.4–10.5)
CHLORIDE: 108 mmol/L (ref 96–112)
CO2: 24 mmol/L (ref 19–32)
CREATININE: 0.77 mg/dL (ref 0.50–1.10)
GFR calc non Af Amer: >90 mL/min (ref >90)
Glucose, Bld: 96 mg/dL (ref 70–99)
Potassium: 3.8 mmol/L (ref 3.5–5.1)
SODIUM: 138 mmol/L (ref 135–145)
Total Bilirubin: 0.5 mg/dL (ref 0.3–1.2)
Total Protein: 6.9 g/dL (ref 6.0–8.3)

## 2014-12-20 LAB — CBC WITH DIFFERENTIAL/PLATELET
BASOS PCT: 0 % (ref 0–1)
Basophils Absolute: 0 10*3/uL (ref 0.0–0.1)
Eosinophils Absolute: 0.1 10*3/uL (ref 0.0–0.7)
Eosinophils Relative: 2 % (ref 0–5)
HCT: 37.9 % (ref 36.0–46.0)
Hemoglobin: 11.9 g/dL — ABNORMAL LOW (ref 12.0–15.0)
Lymphocytes Relative: 31 % (ref 12–46)
Lymphs Abs: 1.7 10*3/uL (ref 0.7–4.0)
MCH: 24.2 pg — ABNORMAL LOW (ref 26.0–34.0)
MCHC: 31.4 g/dL (ref 30.0–36.0)
MCV: 77.2 fL — ABNORMAL LOW (ref 78.0–100.0)
MONO ABS: 0.4 10*3/uL (ref 0.1–1.0)
Monocytes Relative: 7 % (ref 3–12)
Neutro Abs: 3.3 10*3/uL (ref 1.7–7.7)
Neutrophils Relative %: 60 % (ref 43–77)
Platelets: 331 10*3/uL (ref 150–400)
RBC: 4.91 MIL/uL (ref 3.87–5.11)
RDW: 17 % — AB (ref 11.5–15.5)
WBC: 5.4 10*3/uL (ref 4.0–10.5)

## 2014-12-20 LAB — PROTIME-INR
INR: 0.96 (ref 0.00–1.49)
Prothrombin Time: 12.9 seconds (ref 11.6–15.2)

## 2014-12-20 LAB — SURGICAL PCR SCREEN
MRSA, PCR: NEGATIVE
STAPHYLOCOCCUS AUREUS: NEGATIVE

## 2014-12-20 LAB — TYPE AND SCREEN
ABO/RH(D): O POS
Antibody Screen: NEGATIVE

## 2014-12-20 LAB — ABO/RH: ABO/RH(D): O POS

## 2014-12-20 LAB — HCG, SERUM, QUALITATIVE: Preg, Serum: NEGATIVE

## 2014-12-20 LAB — APTT: aPTT: 34 seconds (ref 24–37)

## 2014-12-20 NOTE — Progress Notes (Signed)
Call to Dr. Laurena Bering office, confirmed time for surgery. Angela Nevin had to check with the booking dept. & called back to say that the time was posted wrong but its been corrected.

## 2014-12-20 NOTE — Progress Notes (Signed)
Pt. Denies chest, breathing, heart problems.  Pt. Followed by Glade Lloyd at [facility] adult medicine. Pt. Denies ever having a stress test, echo, etc.

## 2014-12-25 MED ORDER — POVIDONE-IODINE 7.5 % EX SOLN
Freq: Once | CUTANEOUS | Status: DC
  Filled 2014-12-25: qty 118

## 2014-12-25 MED ORDER — CEFAZOLIN SODIUM-DEXTROSE 2-3 GM-% IV SOLR
2.0000 g | INTRAVENOUS | Status: AC
  Administered 2014-12-26: 2 g via INTRAVENOUS
  Filled 2014-12-25: qty 50

## 2014-12-26 ENCOUNTER — Ambulatory Visit (HOSPITAL_COMMUNITY)
Admission: RE | Admit: 2014-12-26 | Discharge: 2014-12-26 | Disposition: A | Discharge status: Home / Self Care | Payer: 59 | Source: Ambulatory Visit | Attending: Orthopedic Surgery | Admitting: Orthopedic Surgery

## 2014-12-26 ENCOUNTER — Ambulatory Visit (HOSPITAL_COMMUNITY): Payer: 59

## 2014-12-26 ENCOUNTER — Ambulatory Visit (HOSPITAL_COMMUNITY): Payer: 59 | Admitting: Certified Registered Nurse Anesthetist

## 2014-12-26 ENCOUNTER — Encounter (HOSPITAL_COMMUNITY): Payer: Self-pay | Admitting: Certified Registered Nurse Anesthetist

## 2014-12-26 ENCOUNTER — Encounter (HOSPITAL_COMMUNITY)
Admission: RE | Disposition: A | Discharge status: Home / Self Care | Payer: Self-pay | Source: Ambulatory Visit | Attending: Orthopedic Surgery

## 2014-12-26 DIAGNOSIS — I10 Essential (primary) hypertension: Secondary | ICD-10-CM | POA: Diagnosis not present

## 2014-12-26 DIAGNOSIS — M79604 Pain in right leg: Secondary | ICD-10-CM | POA: Diagnosis present

## 2014-12-26 DIAGNOSIS — G8929 Other chronic pain: Secondary | ICD-10-CM | POA: Insufficient documentation

## 2014-12-26 DIAGNOSIS — F17210 Nicotine dependence, cigarettes, uncomplicated: Secondary | ICD-10-CM | POA: Diagnosis not present

## 2014-12-26 DIAGNOSIS — Z7952 Long term (current) use of systemic steroids: Secondary | ICD-10-CM | POA: Diagnosis not present

## 2014-12-26 DIAGNOSIS — M5418 Radiculopathy, sacral and sacrococcygeal region: Secondary | ICD-10-CM | POA: Insufficient documentation

## 2014-12-26 DIAGNOSIS — H5442 Blindness, left eye, normal vision right eye: Secondary | ICD-10-CM | POA: Insufficient documentation

## 2014-12-26 DIAGNOSIS — M1990 Unspecified osteoarthritis, unspecified site: Secondary | ICD-10-CM | POA: Insufficient documentation

## 2014-12-26 DIAGNOSIS — M5127 Other intervertebral disc displacement, lumbosacral region: Secondary | ICD-10-CM | POA: Diagnosis not present

## 2014-12-26 DIAGNOSIS — M79605 Pain in left leg: Secondary | ICD-10-CM | POA: Diagnosis present

## 2014-12-26 DIAGNOSIS — Z791 Long term (current) use of non-steroidal anti-inflammatories (NSAID): Secondary | ICD-10-CM | POA: Diagnosis not present

## 2014-12-26 DIAGNOSIS — Z419 Encounter for procedure for purposes other than remedying health state, unspecified: Secondary | ICD-10-CM

## 2014-12-26 DIAGNOSIS — Z79899 Other long term (current) drug therapy: Secondary | ICD-10-CM | POA: Diagnosis not present

## 2014-12-26 HISTORY — PX: LUMBAR LAMINECTOMY/DECOMPRESSION MICRODISCECTOMY: SHX5026

## 2014-12-26 SURGERY — LUMBAR LAMINECTOMY/DECOMPRESSION MICRODISCECTOMY
Anesthesia: General | Site: Back

## 2014-12-26 MED ORDER — METHYLPREDNISOLONE ACETATE 40 MG/ML IJ SUSP
INTRAMUSCULAR | Status: DC | PRN
  Administered 2014-12-26: 40 mg

## 2014-12-26 MED ORDER — BUPIVACAINE-EPINEPHRINE 0.25% -1:200000 IJ SOLN
INTRAMUSCULAR | Status: DC | PRN
  Administered 2014-12-26: 20 mL

## 2014-12-26 MED ORDER — METHYLPREDNISOLONE ACETATE 40 MG/ML IJ SUSP
INTRAMUSCULAR | Status: AC
  Filled 2014-12-26: qty 1

## 2014-12-26 MED ORDER — BUPIVACAINE-EPINEPHRINE (PF) 0.25% -1:200000 IJ SOLN
INTRAMUSCULAR | Status: AC
  Filled 2014-12-26: qty 30

## 2014-12-26 MED ORDER — MIDAZOLAM HCL 2 MG/2ML IJ SOLN
INTRAMUSCULAR | Status: AC
  Filled 2014-12-26: qty 2

## 2014-12-26 MED ORDER — NEOSTIGMINE METHYLSULFATE 10 MG/10ML IV SOLN
INTRAVENOUS | Status: DC | PRN
  Administered 2014-12-26: 4 mg via INTRAVENOUS

## 2014-12-26 MED ORDER — HEMOSTATIC AGENTS (NO CHARGE) OPTIME
TOPICAL | Status: DC | PRN
  Administered 2014-12-26: 1 via TOPICAL

## 2014-12-26 MED ORDER — PHENYLEPHRINE HCL 10 MG/ML IJ SOLN
INTRAMUSCULAR | Status: DC | PRN
  Administered 2014-12-26: 120 ug via INTRAVENOUS

## 2014-12-26 MED ORDER — NEOSTIGMINE METHYLSULFATE 10 MG/10ML IV SOLN
INTRAVENOUS | Status: AC
Start: 2014-12-26 — End: 2014-12-26
  Filled 2014-12-26: qty 2

## 2014-12-26 MED ORDER — OXYCODONE-ACETAMINOPHEN 5-325 MG PO TABS
ORAL_TABLET | ORAL | Status: AC
  Filled 2014-12-26: qty 1

## 2014-12-26 MED ORDER — SODIUM CHLORIDE 0.9 % IV SOLN
10.0000 mg | INTRAVENOUS | Status: DC | PRN
  Administered 2014-12-26: 10 ug/min via INTRAVENOUS

## 2014-12-26 MED ORDER — LIDOCAINE HCL (CARDIAC) 20 MG/ML IV SOLN
INTRAVENOUS | Status: DC | PRN
  Administered 2014-12-26: 60 mg via INTRAVENOUS

## 2014-12-26 MED ORDER — DIAZEPAM 5 MG PO TABS
5.0000 mg | ORAL_TABLET | Freq: Once | ORAL | Status: AC
  Administered 2014-12-26: 5 mg via ORAL

## 2014-12-26 MED ORDER — ONDANSETRON HCL 4 MG/2ML IJ SOLN
INTRAMUSCULAR | Status: AC
  Filled 2014-12-26: qty 2

## 2014-12-26 MED ORDER — FENTANYL CITRATE 0.05 MG/ML IJ SOLN
INTRAMUSCULAR | Status: AC
  Filled 2014-12-26: qty 5

## 2014-12-26 MED ORDER — PHENYLEPHRINE 40 MCG/ML (10ML) SYRINGE FOR IV PUSH (FOR BLOOD PRESSURE SUPPORT)
PREFILLED_SYRINGE | INTRAVENOUS | Status: AC
  Filled 2014-12-26: qty 10

## 2014-12-26 MED ORDER — ROCURONIUM BROMIDE 100 MG/10ML IV SOLN
INTRAVENOUS | Status: DC | PRN
  Administered 2014-12-26: 50 mg via INTRAVENOUS
  Administered 2014-12-26: 10 mg via INTRAVENOUS

## 2014-12-26 MED ORDER — GLYCOPYRROLATE 0.2 MG/ML IJ SOLN
INTRAMUSCULAR | Status: AC
  Filled 2014-12-26: qty 3

## 2014-12-26 MED ORDER — METHYLENE BLUE 1 % INJ SOLN
INTRAMUSCULAR | Status: DC | PRN
  Administered 2014-12-26: 1 mL via INTRADERMAL

## 2014-12-26 MED ORDER — PROPOFOL 10 MG/ML IV BOLUS
INTRAVENOUS | Status: DC | PRN
  Administered 2014-12-26: 200 mg via INTRAVENOUS

## 2014-12-26 MED ORDER — MIDAZOLAM HCL 5 MG/5ML IJ SOLN
INTRAMUSCULAR | Status: DC | PRN
  Administered 2014-12-26: 2 mg via INTRAVENOUS

## 2014-12-26 MED ORDER — LACTATED RINGERS IV SOLN: INTRAVENOUS | Status: DC

## 2014-12-26 MED ORDER — THROMBIN 20000 UNITS EX SOLR
CUTANEOUS | Status: DC | PRN
  Administered 2014-12-26: 20000 mL via TOPICAL

## 2014-12-26 MED ORDER — DEXAMETHASONE SODIUM PHOSPHATE 4 MG/ML IJ SOLN
INTRAMUSCULAR | Status: DC | PRN
  Administered 2014-12-26: 8 mg via INTRAVENOUS

## 2014-12-26 MED ORDER — ONDANSETRON HCL 4 MG/2ML IJ SOLN
INTRAMUSCULAR | Status: DC | PRN
  Administered 2014-12-26: 4 mg via INTRAVENOUS

## 2014-12-26 MED ORDER — DIAZEPAM 5 MG PO TABS
ORAL_TABLET | ORAL | Status: AC
  Filled 2014-12-26: qty 1

## 2014-12-26 MED ORDER — HYDROMORPHONE HCL 1 MG/ML IJ SOLN
0.2500 mg | INTRAMUSCULAR | Status: DC | PRN
  Administered 2014-12-26 (×4): 0.5 mg via INTRAVENOUS

## 2014-12-26 MED ORDER — DEXAMETHASONE SODIUM PHOSPHATE 4 MG/ML IJ SOLN
INTRAMUSCULAR | Status: AC
  Filled 2014-12-26: qty 2

## 2014-12-26 MED ORDER — HYDROMORPHONE HCL 1 MG/ML IJ SOLN
INTRAMUSCULAR | Status: AC
  Filled 2014-12-26: qty 1

## 2014-12-26 MED ORDER — GLYCOPYRROLATE 0.2 MG/ML IJ SOLN
INTRAMUSCULAR | Status: DC | PRN
  Administered 2014-12-26: 0.6 mg via INTRAVENOUS

## 2014-12-26 MED ORDER — OXYCODONE-ACETAMINOPHEN 5-325 MG PO TABS
1.0000 | ORAL_TABLET | Freq: Once | ORAL | Status: AC
  Administered 2014-12-26: 1 via ORAL

## 2014-12-26 MED ORDER — METHYLENE BLUE 1 % INJ SOLN
INTRAMUSCULAR | Status: AC
  Filled 2014-12-26: qty 10

## 2014-12-26 MED ORDER — LACTATED RINGERS IV SOLN
INTRAVENOUS | Status: DC | PRN
  Administered 2014-12-26 (×2): via INTRAVENOUS

## 2014-12-26 MED ORDER — ARTIFICIAL TEARS OP OINT
TOPICAL_OINTMENT | OPHTHALMIC | Status: DC | PRN
  Administered 2014-12-26: 1 via OPHTHALMIC

## 2014-12-26 MED ORDER — THROMBIN 20000 UNITS EX SOLR
CUTANEOUS | Status: AC
  Filled 2014-12-26: qty 20000

## 2014-12-26 MED ORDER — PROPOFOL 10 MG/ML IV BOLUS
INTRAVENOUS | Status: AC
  Filled 2014-12-26: qty 20

## 2014-12-26 MED ORDER — FENTANYL CITRATE 0.05 MG/ML IJ SOLN
INTRAMUSCULAR | Status: DC | PRN
  Administered 2014-12-26: 100 ug via INTRAVENOUS
  Administered 2014-12-26: 150 ug via INTRAVENOUS
  Administered 2014-12-26 (×2): 100 ug via INTRAVENOUS
  Administered 2014-12-26: 50 ug via INTRAVENOUS

## 2014-12-26 SURGICAL SUPPLY — 69 items
BENZOIN TINCTURE PRP APPL 2/3 (GAUZE/BANDAGES/DRESSINGS) ×2 IMPLANT
BUR ROUND PRECISION 4.0 (BURR) ×2 IMPLANT
CANISTER SUCTION 2500CC (MISCELLANEOUS) ×2 IMPLANT
CARTRIDGE OIL MAESTRO DRILL (MISCELLANEOUS) ×1 IMPLANT
CLSR STERI-STRIP ANTIMIC 1/2X4 (GAUZE/BANDAGES/DRESSINGS) ×2 IMPLANT
CORDS BIPOLAR (ELECTRODE) ×2 IMPLANT
COVER SURGICAL LIGHT HANDLE (MISCELLANEOUS) ×2 IMPLANT
DIFFUSER DRILL AIR PNEUMATIC (MISCELLANEOUS) ×2 IMPLANT
DRAIN CHANNEL 15F RND FF W/TCR (WOUND CARE) IMPLANT
DRAPE POUCH INSTRU U-SHP 10X18 (DRAPES) ×4 IMPLANT
DRAPE SURG 17X23 STRL (DRAPES) ×8 IMPLANT
DURAPREP 26ML APPLICATOR (WOUND CARE) ×2 IMPLANT
ELECT BLADE 4.0 EZ CLEAN MEGAD (MISCELLANEOUS)
ELECT CAUTERY BLADE 6.4 (BLADE) ×2 IMPLANT
ELECT REM PT RETURN 9FT ADLT (ELECTROSURGICAL) ×2
ELECTRODE BLDE 4.0 EZ CLN MEGD (MISCELLANEOUS) IMPLANT
ELECTRODE REM PT RTRN 9FT ADLT (ELECTROSURGICAL) ×1 IMPLANT
EVACUATOR SILICONE 100CC (DRAIN) IMPLANT
FILTER STRAW FLUID ASPIR (MISCELLANEOUS) ×2 IMPLANT
GAUZE SPONGE 4X4 12PLY STRL (GAUZE/BANDAGES/DRESSINGS) ×2 IMPLANT
GAUZE SPONGE 4X4 16PLY XRAY LF (GAUZE/BANDAGES/DRESSINGS) ×4 IMPLANT
GLOVE BIO SURGEON STRL SZ7 (GLOVE) ×2 IMPLANT
GLOVE BIO SURGEON STRL SZ8 (GLOVE) ×2 IMPLANT
GLOVE BIOGEL PI IND STRL 7.0 (GLOVE) ×1 IMPLANT
GLOVE BIOGEL PI IND STRL 8 (GLOVE) ×1 IMPLANT
GLOVE BIOGEL PI INDICATOR 7.0 (GLOVE) ×1
GLOVE BIOGEL PI INDICATOR 8 (GLOVE) ×1
GOWN STRL REUS W/ TWL LRG LVL3 (GOWN DISPOSABLE) ×1 IMPLANT
GOWN STRL REUS W/ TWL XL LVL3 (GOWN DISPOSABLE) ×2 IMPLANT
GOWN STRL REUS W/TWL LRG LVL3 (GOWN DISPOSABLE) ×1
GOWN STRL REUS W/TWL XL LVL3 (GOWN DISPOSABLE) ×2
IV CATH 14GX2 1/4 (CATHETERS) ×2 IMPLANT
KIT BASIN OR (CUSTOM PROCEDURE TRAY) ×2 IMPLANT
KIT POSITION SURG JACKSON T1 (MISCELLANEOUS) ×2 IMPLANT
KIT ROOM TURNOVER OR (KITS) ×2 IMPLANT
NEEDLE 18GX1X1/2 (RX/OR ONLY) (NEEDLE) ×2 IMPLANT
NEEDLE 22X1 1/2 (OR ONLY) (NEEDLE) ×2 IMPLANT
NEEDLE HYPO 25GX1X1/2 BEV (NEEDLE) ×2 IMPLANT
NEEDLE SPNL 18GX3.5 QUINCKE PK (NEEDLE) ×4 IMPLANT
NS IRRIG 1000ML POUR BTL (IV SOLUTION) ×2 IMPLANT
OIL CARTRIDGE MAESTRO DRILL (MISCELLANEOUS) ×2
PACK LAMINECTOMY ORTHO (CUSTOM PROCEDURE TRAY) ×2 IMPLANT
PACK UNIVERSAL I (CUSTOM PROCEDURE TRAY) ×2 IMPLANT
PAD ARMBOARD 7.5X6 YLW CONV (MISCELLANEOUS) ×4 IMPLANT
PATTIES SURGICAL .5 X.5 (GAUZE/BANDAGES/DRESSINGS) IMPLANT
PATTIES SURGICAL .5 X1 (DISPOSABLE) ×2 IMPLANT
SPONGE INTESTINAL PEANUT (DISPOSABLE) ×2 IMPLANT
SPONGE SURGIFOAM ABS GEL 100 (HEMOSTASIS) ×2 IMPLANT
SPONGE SURGIFOAM ABS GEL SZ50 (HEMOSTASIS) ×2 IMPLANT
STRIP CLOSURE SKIN 1/2X4 (GAUZE/BANDAGES/DRESSINGS) IMPLANT
SURGIFLO TRUKIT (HEMOSTASIS) IMPLANT
SURGIFLO W/THROMBIN 8M KIT (HEMOSTASIS) ×2 IMPLANT
SUT MNCRL AB 4-0 PS2 18 (SUTURE) ×2 IMPLANT
SUT VIC AB 0 CT1 18XCR BRD 8 (SUTURE) IMPLANT
SUT VIC AB 0 CT1 27 (SUTURE)
SUT VIC AB 0 CT1 27XBRD ANBCTR (SUTURE) IMPLANT
SUT VIC AB 0 CT1 8-18 (SUTURE)
SUT VIC AB 1 CT1 18XCR BRD 8 (SUTURE) ×1 IMPLANT
SUT VIC AB 1 CT1 8-18 (SUTURE) ×1
SUT VIC AB 2-0 CT2 18 VCP726D (SUTURE) ×2 IMPLANT
SYR 20CC LL (SYRINGE) IMPLANT
SYR BULB IRRIGATION 50ML (SYRINGE) ×2 IMPLANT
SYR CONTROL 10ML LL (SYRINGE) ×4 IMPLANT
SYR TB 1ML 26GX3/8 SAFETY (SYRINGE) ×4 IMPLANT
SYR TB 1ML LUER SLIP (SYRINGE) ×4 IMPLANT
TOWEL OR 17X24 6PK STRL BLUE (TOWEL DISPOSABLE) ×2 IMPLANT
TOWEL OR 17X26 10 PK STRL BLUE (TOWEL DISPOSABLE) ×2 IMPLANT
WATER STERILE IRR 1000ML POUR (IV SOLUTION) ×2 IMPLANT
YANKAUER SUCT BULB TIP NO VENT (SUCTIONS) ×2 IMPLANT

## 2014-12-26 NOTE — Transfer of Care (Signed)
Immediate Anesthesia Transfer of Care Note  Patient: Sydney [name]  Procedure(s) Performed: Procedure(s) with comments: LUMBAR LAMINECTOMY/DECOMPRESSION MICRODISCECTOMY (N/A) - Lumbar 5-scacrum 1 decompression  Patient Location: PACU  Anesthesia Type:General  Level of Consciousness: awake  Airway & Oxygen Therapy: Patient Spontanous Breathing and Patient connected to nasal cannula oxygen  Post-op Assessment: Report given to RN, Post -op Vital signs reviewed and stable and Patient moving all extremities  Post vital signs: Reviewed and stable  Last Vitals:  Filed Vitals:   12/26/14 1024  BP: 139/98  Pulse: 77  Temp: 36.8 C  Resp: 20    Complications: No apparent anesthesia complications
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
## 2014-12-26 NOTE — Anesthesia Postprocedure Evaluation (Signed)
  Anesthesia Post-op Note  Patient: Sydney [name]  Procedure(s) Performed: Procedure(s) with comments: LUMBAR LAMINECTOMY/DECOMPRESSION MICRODISCECTOMY (N/A) - Lumbar 5-scacrum 1 decompression  Patient Location: PACU  Anesthesia Type: General   Level of Consciousness: awake, alert  and oriented  Airway and Oxygen Therapy: Patient Spontanous Breathing  Post-op Pain: moderate  Post-op Assessment: Post-op Vital signs reviewed  Post-op Vital Signs: Reviewed  Last Vitals:  Filed Vitals:   12/26/14 1632  BP: 116/80  Pulse: 58  Temp:   Resp: 16    Complications: No apparent anesthesia complications

## 2014-12-26 NOTE — Anesthesia Preprocedure Evaluation (Addendum)
Anesthesia Evaluation  Patient identified by MRN, date of birth, ID band Patient awake    Reviewed: Allergy & Precautions, H&P , NPO status , Patient's Chart, lab work & pertinent test results  Airway Mallampati: II  TM Distance: >3 FB Neck ROM: Full    Dental no notable dental hx. (+) Teeth Intact, Dental Advisory Given   Pulmonary Current Smoker,  breath sounds clear to auscultation  Pulmonary exam normal       Cardiovascular hypertension, Pt. on medications Rhythm:Regular Rate:Normal     Neuro/Psych Anxiety Depression negative neurological ROS     GI/Hepatic negative GI ROS, Neg liver ROS,   Endo/Other  negative endocrine ROS  Renal/GU negative Renal ROS  negative genitourinary   Musculoskeletal  (+) Arthritis -, Osteoarthritis,    Abdominal   Peds  Hematology negative hematology ROS (+)   Anesthesia Other Findings   Reproductive/Obstetrics negative OB ROS                            Anesthesia Physical Anesthesia Plan  ASA: II  Anesthesia Plan: General   Post-op Pain Management:    Induction: Intravenous  Airway Management Planned: Oral ETT  Additional Equipment:   Intra-op Plan:   Post-operative Plan: Extubation in OR  Informed Consent: I have reviewed the patients History and Physical, chart, labs and discussed the procedure including the risks, benefits and alternatives for the proposed anesthesia with the patient or authorized representative who has indicated his/her understanding and acceptance.   Dental advisory given  Plan Discussed with: CRNA  Anesthesia Plan Comments:         Anesthesia Quick Evaluation

## 2014-12-27 NOTE — Op Note (Signed)
[name], [name]            ACCOUNT NO.:  [id number]  MEDICAL RECORD NO.:  [id number]  LOCATION:  [facility]                         FACILITY:  [facility]  PHYSICIAN:  Phylliss Bob, MD      DATE OF BIRTH:  12/22/1970  DATE OF PROCEDURE:  12/26/2014 DATE OF DISCHARGE:  12/26/2014                              OPERATIVE REPORT   PREOPERATIVE DIAGNOSES: 1. Bilateral S1 radiculopathy. 2. Very large extruded L5-S1 disc herniation occupying approximately     90% of Sydney spinal canal and bilateral lateral recesses.  POSTOPERATIVE DIAGNOSES: 1. Bilateral S1 radiculopathy. 2. Very large extruded L5-S1 disc herniation occupying approximately     90% of Sydney spinal canal and bilateral lateral recesses.  PROCEDURES:  L5-S1 laminectomy with bilateral partial facetectomy and removal of large, complex extruded L5-S1 disc herniation.  SURGEON:  Phylliss Bob, MD  ASSISTANTS:  Pricilla Holm, PA-C  ANESTHESIA:  General endotracheal anesthesia.  COMPLICATIONS:  None.  DISPOSITION:  Stable.  ESTIMATED BLOOD LOSS:  Minimal.  INDICATIONS FOR SURGERY:  Briefly, Sydney [name] is a pleasant 44 year old female, who did initially present to me on December 07, 2014, at Sydney request of Dr. Yvetta Coder.  At that point in time, Sydney [name] did have an 8- month history of low back and bilateral leg pain, right greater than Sydney left.  Sydney pain was increasing over Sydney course of Sydney last 8 months.  An MRI did reveal a large L5-S1 disc herniation.  Sydney pain was severe and was affecting her routine activities of daily living.  Sydney [name] failed serious forms of conservative care and did continue to have pain. Sydney MRI was as noted above.  Sydney [name] did also report weakness. Given Sydney findings on her MRI and her ongoing debilitating pain, we did discuss proceeding with Sydney procedure noted above.  Sydney [name] did fully understand Sydney risks and limitations of Sydney procedure as outlined in my preoperative  note.  OPERATIVE DETAILS:  On December 26, 2014, Sydney [name] was brought to surgery and general endotracheal anesthesia was administered.  Sydney [name] was placed prone on a well-padded flat Jackson bed with a spinal frame.  Antibiotics were given and a time-out procedure was performed. Sydney back was prepped and draped.  A midline incision was then made overlying Sydney L5-S1 intervertebral space.  Sydney fascia was incised in Sydney midline and radiographed to confirm Sydney appropriate operative level.  I then subperiosteally exposed Sydney lamina of L5 and S1.  Sydney spinous process of L5 was removed.  I then performed a bilateral lateral recess decompression.  Sydney traversing S1 nerves were noted.  With my attention first on Sydney right side, Sydney right S1 nerve was gently retracted medially.  A very large herniation was readily apparent immediately ventral to Sydney S1 nerve.  I did use a micro nerve hook to tease away Sydney superficial layers overlying Sydney disc herniation.  I then used a reverse angled Epstein curette to free up Sydney extruded disc fragments from Sydney posterior longitudinal ligament.  I then removed 3 very large fragments of intervertebral disc herniation.  In doing so, I was able to fully and adequately decompress Sydney traversing S1 nerve entirely.  I  was very pleased with Sydney decompression.  There was abundant bleeding which was controlled using a Surgiflo.  Patties were then placed.  I then turned my attention towards Sydney left side.  Sydney left S1 nerve was retracted medially in Sydney manner previously described, multiple fragments of disc were removed.  On Sydney left side, there were more chronic and adherent fragments noted behind Sydney L5 and S1 vertebral bodies.  This was a very meticulous portion of Sydney procedure, and did take more time than a typical decompression, given Sydney fact that Sydney herniated fragments were large and very much adherent to Sydney vertebral bodies above and below  Sydney intervertebral disc.  In any event, I was able to successfully decompress Sydney lateral recess.  Both Sydney S1 nerves were noted to be erythematous and swollen.  I was very pleased with Sydney decompression that I was able to accomplish.  Sydney wound was then copiously irrigated. There was no extravasation of cerebrospinal fluid noted throughout Sydney surgery.  All epidural bleeding was controlled at Sydney termination of Sydney procedure.  Sydney fascia was then closed using #1 Vicryl.  Sydney subcutaneous layer was closed using 0 Vicryl followed by 2-0 Vicryl, and Sydney skin was closed using 3-0 Monocryl.  Benzoin and Steri-Strips were applied followed by sterile dressing.  All instrument counts were correct at Sydney termination of Sydney procedure.  Of note, Pricilla Holm was my assistant throughout surgery, and did aid in retraction, suctioning, and closure.     Phylliss Bob, MD     MD/MEDQ  D:  12/26/2014  T:  12/27/2014  Job:  [id number]  cc:   Dr. Mellody Dance, D.O.

## 2014-12-31 ENCOUNTER — Encounter (HOSPITAL_COMMUNITY): Payer: Self-pay | Admitting: Orthopedic Surgery

## 2015-04-19 ENCOUNTER — Other Ambulatory Visit: Payer: Self-pay | Admitting: Medical

## 2015-05-02 ENCOUNTER — Other Ambulatory Visit: Payer: Self-pay | Admitting: Obstetrics and Gynecology

## 2015-05-02 DIAGNOSIS — N9089 Other specified noninflammatory disorders of vulva and perineum: Secondary | ICD-10-CM

## 2015-05-06 ENCOUNTER — Other Ambulatory Visit: Payer: 59

## 2015-05-09 ENCOUNTER — Telehealth: Payer: Self-pay | Admitting: Internal Medicine

## 2015-05-09 NOTE — Telephone Encounter (Signed)
Dr. Garwin Brothers called stating that because pt has [employer] we have to do the referral for a CT Pelvic with Contrast CPt- [id number].  [id number] Phone # [phone] ext: [phone] Fax # [phone]

## 2015-05-09 NOTE — Telephone Encounter (Signed)
The referral was completed by Dr. Garwin Brothers office

## 2015-05-13 ENCOUNTER — Inpatient Hospital Stay: Admission: RE | Admit: 2015-05-13 | Payer: 59 | Source: Ambulatory Visit

## 2015-05-15 ENCOUNTER — Ambulatory Visit
Admission: RE | Admit: 2015-05-15 | Discharge: 2015-05-15 | Disposition: A | Discharge status: Home / Self Care | Payer: 59 | Source: Ambulatory Visit | Attending: Obstetrics and Gynecology | Admitting: Obstetrics and Gynecology

## 2015-05-15 DIAGNOSIS — N9089 Other specified noninflammatory disorders of vulva and perineum: Secondary | ICD-10-CM

## 2015-05-15 MED ORDER — IOPAMIDOL (ISOVUE-300) INJECTION 61%
85.0000 mL | Freq: Once | INTRAVENOUS | Status: AC | PRN
  Administered 2015-05-15: 85 mL via INTRAVENOUS

## 2015-05-31 ENCOUNTER — Telehealth: Payer: Self-pay | Admitting: Internal Medicine

## 2015-05-31 NOTE — Telephone Encounter (Signed)
Pt needs a referral to [facility] with Dr. Phylliss Bob for back pain. She has [employer] so she has to have a referral to be seen there. One referral is done she can call and schedule appt  [facility]- [id number] Dx code- G89.29- back pain

## 2015-06-03 NOTE — Telephone Encounter (Signed)
Please handle referral back to Dr. Lynann Bologna.  This is for paperwok purposes.

## 2015-06-04 NOTE — Telephone Encounter (Signed)
I have done referral for pt and faxed over to [facility]@ [phone]

## 2015-07-12 ENCOUNTER — Ambulatory Visit (INDEPENDENT_AMBULATORY_CARE_PROVIDER_SITE_OTHER): Payer: 59 | Admitting: Medical

## 2015-07-12 ENCOUNTER — Encounter: Payer: Self-pay | Admitting: Medical

## 2015-07-12 VITALS — BP 124/98 | HR 92 | Wt 181.0 lb

## 2015-07-12 DIAGNOSIS — H9319 Tinnitus, unspecified ear: Secondary | ICD-10-CM | POA: Diagnosis not present

## 2015-07-12 DIAGNOSIS — I10 Essential (primary) hypertension: Secondary | ICD-10-CM

## 2015-07-12 DIAGNOSIS — H8110 Benign paroxysmal vertigo, unspecified ear: Secondary | ICD-10-CM | POA: Insufficient documentation

## 2015-07-12 DIAGNOSIS — H8113 Benign paroxysmal vertigo, bilateral: Secondary | ICD-10-CM

## 2015-07-12 DIAGNOSIS — H9193 Unspecified hearing loss, bilateral: Secondary | ICD-10-CM

## 2015-07-12 MED ORDER — TRIAMTERENE-HCTZ 37.5-25 MG PO CAPS: 1.0000 | ORAL_CAPSULE | Freq: Every day | ORAL | Status: DC

## 2015-07-12 MED ORDER — AMLODIPINE BESYLATE 10 MG PO TABS: 10.0000 mg | ORAL_TABLET | Freq: Every day | ORAL | Status: DC

## 2015-07-12 MED ORDER — MECLIZINE HCL 25 MG PO TABS: 25.0000 mg | ORAL_TABLET | Freq: Two times a day (BID) | ORAL | Status: DC
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
## 2015-07-18 ENCOUNTER — Telehealth: Payer: Self-pay | Admitting: Medical

## 2015-07-18 NOTE — Telephone Encounter (Signed)
So is she agreeable to another try on Lamisil?  Toenail fungus generally takes at least 1-2 months to see improvement.  There is not quick fix.

## 2015-07-18 NOTE — Telephone Encounter (Signed)
If nails are not very loose, I recommend we start oral Lamisil once daily for toenail fungus.  See if agreeable.  She doesn't necessarily need to see podiatrist for this

## 2015-07-18 NOTE — Telephone Encounter (Signed)
Pt says she mentioned to the CNA at her 07/12/15 appt that she was having issues with fungus or something with her toe. Pt has has been using OTC meds on her toenail, it is discolored and lifting now. She is afraid that she may lose the toenail. Requesting a referral to a podiatrist.

## 2015-07-18 NOTE — Telephone Encounter (Signed)
Tried Lamisil did not get any better. She said that its not getting any worse at this time though but she states she wants it gone immediately

## 2015-07-19 NOTE — Telephone Encounter (Signed)
Refer to podiatry then

## 2015-07-19 NOTE — Telephone Encounter (Signed)
Tried Lamisil for a year. She is not entirely sure its fungus but is treating it like one. Wants referral to podiatrist. Also wanted me to tell you that you had treated for vertigo but now is waking up with a headache and ear aching.

## 2015-07-20 ENCOUNTER — Telehealth: Payer: Self-pay | Admitting: Medical

## 2015-07-20 DIAGNOSIS — IMO0002 Reserved for concepts with insufficient information to code with codable children: Secondary | ICD-10-CM

## 2015-07-20 DIAGNOSIS — B351 Tinea unguium: Secondary | ICD-10-CM

## 2015-07-20 NOTE — Telephone Encounter (Signed)
Refer back to GI for hx/o stromal tumor, for surveillance

## 2015-07-22 ENCOUNTER — Encounter: Payer: Self-pay | Admitting: Gastroenterology

## 2015-07-22 NOTE — Telephone Encounter (Signed)
Referred to [facility].

## 2015-07-22 NOTE — Telephone Encounter (Signed)
done

## 2015-08-06 ENCOUNTER — Ambulatory Visit: Payer: 59 | Admitting: Podiatry

## 2015-08-07 ENCOUNTER — Telehealth: Payer: Self-pay

## 2015-08-07 ENCOUNTER — Telehealth: Payer: Self-pay | Admitting: Medical

## 2015-08-07 DIAGNOSIS — N907 Vulvar cyst: Secondary | ICD-10-CM

## 2015-08-07 NOTE — Telephone Encounter (Signed)
Left message for them to send them back over since i cant find them anywhere

## 2015-08-07 NOTE — Telephone Encounter (Signed)
pls check paper chart, scanned records for the October gyn notes

## 2015-08-07 NOTE — Telephone Encounter (Signed)
I received referral request STAT from [facility] to go see a different gynecologist and general surgeon.   Of note, last visit we made several referrals.   Thus, please make ASAP appt here to discuss and to get records so we can get the referral done correctly.    Work her in tomorrow if possible.  Also, we will need records from [facility].  All I got from them was this request and reference to see office notes from 07/19/15 which I don't have.   So I can't referral without those notes.   So scheduler her appt here tomorrow if possible and lets work to get those records from [employer].  (FYI - if she has a problem with appt here, she was seen recently here, is due for f/u on that visit, and we made several referrals on that visit already which should have been a separate visit at that time.)

## 2015-08-07 NOTE — Telephone Encounter (Signed)
She has an appointment with [place] at 945 and I informed her she must come to this appt to be able to go to her appt at 315 with [facility]. She was stating she will not come here if she has a co pay that is why I made sure she knew that it was a necessity for the surgery center appt. Seth Bake from [facility] is faxing notes to the surgery center and said she sent the notes to [place] at the beginning of October.

## 2015-08-08 ENCOUNTER — Ambulatory Visit (INDEPENDENT_AMBULATORY_CARE_PROVIDER_SITE_OTHER): Payer: 59 | Admitting: General Surgery

## 2015-08-08 ENCOUNTER — Ambulatory Visit (INDEPENDENT_AMBULATORY_CARE_PROVIDER_SITE_OTHER): Payer: 59 | Admitting: Medical

## 2015-08-08 ENCOUNTER — Encounter: Payer: Self-pay | Admitting: Medical

## 2015-08-08 ENCOUNTER — Encounter: Payer: Self-pay | Admitting: General Surgery

## 2015-08-08 ENCOUNTER — Telehealth: Payer: Self-pay | Admitting: Medical

## 2015-08-08 VITALS — BP 122/88 | HR 95 | Wt 181.0 lb

## 2015-08-08 VITALS — BP 146/90 | HR 78 | Resp 16 | Ht 69.0 in | Wt 179.0 lb

## 2015-08-08 DIAGNOSIS — K4090 Unilateral inguinal hernia, without obstruction or gangrene, not specified as recurrent: Secondary | ICD-10-CM

## 2015-08-08 DIAGNOSIS — D259 Leiomyoma of uterus, unspecified: Secondary | ICD-10-CM

## 2015-08-08 DIAGNOSIS — I10 Essential (primary) hypertension: Secondary | ICD-10-CM | POA: Diagnosis not present

## 2015-08-08 DIAGNOSIS — R609 Edema, unspecified: Secondary | ICD-10-CM | POA: Diagnosis not present

## 2015-08-08 DIAGNOSIS — H8110 Benign paroxysmal vertigo, unspecified ear: Secondary | ICD-10-CM

## 2015-08-08 MED ORDER — AMLODIPINE BESYLATE 10 MG PO TABS: 10.0000 mg | ORAL_TABLET | Freq: Every day | ORAL | Status: DC

## 2015-08-08 NOTE — Telephone Encounter (Signed)
error 

## 2015-08-08 NOTE — Telephone Encounter (Signed)
We saw [place] today.   Please call gyn to coordinate.  We will make referral but we need to know exactly who and how to handle.     This surgery will requite gynecologist in conjunction with general surgery here in [place] at the same time, or what is the plan, so that when you call [facility], we get this right.   Of note, she has an large left inguinal hernia with ovary or mass within the hernia, but they are planning to do hysterectomy at the same time.
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
## 2015-08-08 NOTE — Patient Instructions (Signed)
Hernia, Adult A hernia is the bulging of an organ or tissue through a weak spot in the muscles of the abdomen (abdominal wall). Hernias develop most often near the navel or groin. There are many kinds of hernias. Common kinds include:  Femoral hernia. This kind of hernia develops under the groin in the upper thigh area.  Inguinal hernia. This kind of hernia develops in the groin or scrotum.  Umbilical hernia. This kind of hernia develops near the navel.  Hiatal hernia. This kind of hernia causes part of the stomach to be pushed up into the chest.  Incisional hernia. This kind of hernia bulges through a scar from an abdominal surgery. CAUSES This condition may be caused by:  Heavy lifting.  Coughing over a long period of time.  Straining to have a bowel movement.  An incision made during an abdominal surgery.  A birth defect (congenital defect).  Excess weight or obesity.  Smoking.  Poor nutrition.  Cystic fibrosis.  Excess fluid in the abdomen.  Undescended testicles. SYMPTOMS Symptoms of a hernia include:  A lump on the abdomen. This is the first sign of a hernia. The lump may become more obvious with standing, straining, or coughing. It may get bigger over time if it is not treated or if the condition causing it is not treated.  Pain. A hernia is usually painless, but it may become painful over time if treatment is delayed. The pain is usually dull and may get worse with standing or lifting heavy objects. Sometimes a hernia gets tightly squeezed in the weak spot (strangulated) or stuck there (incarcerated) and causes additional symptoms. These symptoms may include:  Vomiting.  Nausea.  Constipation.  Irritability. DIAGNOSIS A hernia may be diagnosed with:  A physical exam. During the exam your health care provider may ask you to cough or to make a specific movement, because a hernia is usually more visible when you move.  Imaging tests. These can  include:  X-rays.  Ultrasound.  CT scan. TREATMENT A hernia that is small and painless may not need to be treated. A hernia that is large or painful may be treated with surgery. Inguinal hernias may be treated with surgery to prevent incarceration or strangulation. Strangulated hernias are always treated with surgery, because lack of blood to the trapped organ or tissue can cause it to die. Surgery to treat a hernia involves pushing the bulge back into place and repairing the weak part of the abdomen. HOME CARE INSTRUCTIONS  Avoid straining.  Do not lift anything heavier than 10 lb (4.5 kg).  Lift with your leg muscles, not your back muscles. This helps avoid strain.  When coughing, try to cough gently.  Prevent constipation. Constipation leads to straining with bowel movements, which can make a hernia worse or cause a hernia repair to break down. You can prevent constipation by:  Eating a high-fiber diet that includes plenty of fruits and vegetables.  Drinking enough fluids to keep your urine clear or pale yellow. Aim to drink 6-8 glasses of water per day.  Using a stool softener as directed by your health care provider.  Lose weight, if you are overweight.  Do not use any tobacco products, including cigarettes, chewing tobacco, or electronic cigarettes. If you need help quitting, ask your health care provider.  Keep all follow-up visits as directed by your health care provider. This is important. Your health care provider may need to monitor your condition. SEEK MEDICAL CARE IF:  You have   swelling, redness, and pain in the affected area.  Your bowel habits change. SEEK IMMEDIATE MEDICAL CARE IF:  You have a fever.  You have abdominal pain that is getting worse.  You feel nauseous or you vomit.  You cannot push the hernia back in place by gently pressing on it while you are lying down.  The hernia:  Changes in shape or size.  Is stuck outside the  abdomen.  Becomes discolored.  Feels hard or tender.   This information is not intended to replace advice given to you by your health care provider. Make sure you discuss any questions you have with your health care provider.   Document Released: 09/21/2005 Document Revised: 10/12/2014 Document Reviewed: 08/01/2014 Elsevier Interactive Patient Education 2016 Elsevier Inc[employer]  

## 2015-08-08 NOTE — Telephone Encounter (Signed)
Received notes 08/08/15

## 2015-08-08 NOTE — Telephone Encounter (Signed)
Spoke with her she is trying to find a surgery center where dr cousins can still do her surgery but incase that falls through she is still going to the appt today at [facility]. She will let them know that she needs a list of GYN they coordinate with in order to do both at once verses two surgeries

## 2015-08-13 ENCOUNTER — Telehealth: Payer: Self-pay | Admitting: Medical

## 2015-08-13 NOTE — Telephone Encounter (Signed)
Pt called and stated that her insurance told her that she needed a gap exemption from you to [place] [facility] to do her surgery. And that she needed a referral to them to do her hernia surgery, says that [facility] is out of network and that [employer] told her that she needs this from you for [facility] [place] to do her surgery since they are not in network with her insurance, pt can be reached at [phone] if you need more information from her about this.

## 2015-08-14 NOTE — Telephone Encounter (Signed)
Please call her and get whatever phone numbers we need or use her insurance card to get this gap approval.     Loma Sousa, see Mickel Baas and she can probably help with this if u have problems with this

## 2015-08-15 NOTE — Telephone Encounter (Signed)
Mickel Baas can you help with this, not really sure what all will be needed and stuff. The number is [phone]. [facility] would not accept her insurance at first now they will and she needs gap for them being out of network.

## 2015-08-15 NOTE — Telephone Encounter (Signed)
Called pt and explained all this.  She advised she wanted to have both hernia & hysterectomy surgeries at the same time if possible.  And most importantly wanted both of them by the end of the year because she is losing her insurance and also her deductible has been meet for this year already.  Dr. Garwin Brothers is to do her hysterectomy surgery.  She saw Dr. Tawni Pummel at [place].  [phone] and he recommenced having the hysterectomy first but his notes state he was doing to discuss with Dr. Garwin Brothers.

## 2015-08-15 NOTE — Telephone Encounter (Signed)
LMTCB

## 2015-08-15 NOTE — Telephone Encounter (Signed)
Called [facility] t# [phone] s/w Angie and they are out of Network and her insurance DOES NOT cover any benefits for out of network providers.   Gap extension is an approval process thru [facility] for exceptions and she has never seen one that was actually approved and that they actually paid. So they do not schedule pt's with [facility].  Called [facility] t# [phone] and they confirmed pt will not have any paid benefits with out of network provider, so pt will have to go somewhere else.  In network providers are [facility] t# [phone] and [facility] t# [phone] and  [facility] is also in network t# [phone]

## 2015-08-19 NOTE — Telephone Encounter (Signed)
Make sure we forward the gyn records we received, the recent CT of her abdomen/pelvis, and my note.   That is really all they need.

## 2015-08-19 NOTE — Telephone Encounter (Signed)
Called Dr. Jamal Collin and s/w Gwenette Greet, he is waiting on records from [place] regarding the tumor she had and he wants to review those records first.  Dr. Jamal Collin will coordinate care with an OBGYN there to do her hysterectomy surgery at the same time as the hernia repair.  They will note in Epic when this is scheduled.

## 2015-08-19 NOTE — Telephone Encounter (Signed)
See note

## 2015-08-21 NOTE — Telephone Encounter (Signed)
Medical records faxed per [facility] to Dr. Jamal Collin at # [phone]

## 2015-08-27 ENCOUNTER — Ambulatory Visit (INDEPENDENT_AMBULATORY_CARE_PROVIDER_SITE_OTHER): Payer: 59 | Admitting: General Surgery

## 2015-08-27 ENCOUNTER — Ambulatory Visit: Payer: 59 | Admitting: General Surgery

## 2015-08-27 ENCOUNTER — Encounter: Payer: Self-pay | Admitting: General Surgery

## 2015-08-27 VITALS — BP 140/68 | HR 76 | Resp 12 | Ht 69.0 in | Wt 188.0 lb

## 2015-08-27 DIAGNOSIS — Z8509 Personal history of malignant neoplasm of other digestive organs: Secondary | ICD-10-CM | POA: Diagnosis not present

## 2015-08-27 DIAGNOSIS — K4090 Unilateral inguinal hernia, without obstruction or gangrene, not specified as recurrent: Secondary | ICD-10-CM | POA: Diagnosis not present
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
## 2015-08-27 NOTE — Patient Instructions (Signed)
The patient is aware to call back for any questions or concerns. Hernia, Adult A hernia is the bulging of an organ or tissue through a weak spot in the muscles of the abdomen (abdominal wall). Hernias develop most often near the navel or groin. There are many kinds of hernias. Common kinds include:  Femoral hernia. This kind of hernia develops under the groin in the upper thigh area.  Inguinal hernia. This kind of hernia develops in the groin or scrotum.  Umbilical hernia. This kind of hernia develops near the navel.  Hiatal hernia. This kind of hernia causes part of the stomach to be pushed up into the chest.  Incisional hernia. This kind of hernia bulges through a scar from an abdominal surgery. CAUSES This condition may be caused by:  Heavy lifting.  Coughing over a long period of time.  Straining to have a bowel movement.  An incision made during an abdominal surgery.  A birth defect (congenital defect).  Excess weight or obesity.  Smoking.  Poor nutrition.  Cystic fibrosis.  Excess fluid in the abdomen.  Undescended testicles. SYMPTOMS Symptoms of a hernia include:  A lump on the abdomen. This is the first sign of a hernia. The lump may become more obvious with standing, straining, or coughing. It may get bigger over time if it is not treated or if the condition causing it is not treated.  Pain. A hernia is usually painless, but it may become painful over time if treatment is delayed. The pain is usually dull and may get worse with standing or lifting heavy objects. Sometimes a hernia gets tightly squeezed in the weak spot (strangulated) or stuck there (incarcerated) and causes additional symptoms. These symptoms may include:  Vomiting.  Nausea.  Constipation.  Irritability. DIAGNOSIS A hernia may be diagnosed with:  A physical exam. During the exam your health care provider may ask you to cough or to make a specific movement, because a hernia is usually  more visible when you move.  Imaging tests. These can include:  X-rays.  Ultrasound.  CT scan. TREATMENT A hernia that is small and painless may not need to be treated. A hernia that is large or painful may be treated with surgery. Inguinal hernias may be treated with surgery to prevent incarceration or strangulation. Strangulated hernias are always treated with surgery, because lack of blood to the trapped organ or tissue can cause it to die. Surgery to treat a hernia involves pushing the bulge back into place and repairing the weak part of the abdomen. HOME CARE INSTRUCTIONS  Avoid straining.  Do not lift anything heavier than 10 lb (4.5 kg).  Lift with your leg muscles, not your back muscles. This helps avoid strain.  When coughing, try to cough gently.  Prevent constipation. Constipation leads to straining with bowel movements, which can make a hernia worse or cause a hernia repair to break down. You can prevent constipation by:  Eating a high-fiber diet that includes plenty of fruits and vegetables.  Drinking enough fluids to keep your urine clear or pale yellow. Aim to drink 6-8 glasses of water per day.  Using a stool softener as directed by your health care provider.  Lose weight, if you are overweight.  Do not use any tobacco products, including cigarettes, chewing tobacco, or electronic cigarettes. If you need help quitting, ask your health care provider.  Keep all follow-up visits as directed by your health care provider. This is important. Your health care provider may  need to monitor your condition. SEEK MEDICAL CARE IF:  You have swelling, redness, and pain in the affected area.  Your bowel habits change. SEEK IMMEDIATE MEDICAL CARE IF:  You have a fever.  You have abdominal pain that is getting worse.  You feel nauseous or you vomit.  You cannot push the hernia back in place by gently pressing on it while you are lying down.  The hernia:  Changes in  shape or size.  Is stuck outside the abdomen.  Becomes discolored.  Feels hard or tender.   This information is not intended to replace advice given to you by your health care provider. Make sure you discuss any questions you have with your health care provider.   Document Released: 09/21/2005 Document Revised: 10/12/2014 Document Reviewed: 08/01/2014 Elsevier Interactive Patient Education [employer].

## 2015-09-03 ENCOUNTER — Ambulatory Visit (INDEPENDENT_AMBULATORY_CARE_PROVIDER_SITE_OTHER): Payer: 59 | Admitting: Podiatry

## 2015-09-03 VITALS — BP 141/95 | HR 92 | Resp 16 | Ht 69.0 in | Wt 183.0 lb

## 2015-09-03 DIAGNOSIS — L603 Nail dystrophy: Secondary | ICD-10-CM | POA: Diagnosis not present

## 2015-09-03 NOTE — Progress Notes (Signed)
   Subjective:    Patient ID: Sydney [name], female    DOB: [birth year], 44 y.o.   MRN: [id number]  HPI: She presents today chief complaint of painful thickened nail hallux left. She states that she has had some trauma to the nail in the past that the nail is very thick with a lot of white stuff underneath it. She states that she digs the material out while soaking in bathtub.    Review of Systems  Constitutional: Positive for diaphoresis and unexpected weight change.  HENT: Positive for tinnitus.   Eyes: Positive for redness and itching.  Gastrointestinal: Positive for constipation and abdominal distention.  Musculoskeletal: Positive for myalgias and back pain.  Skin: Positive for color change.  Neurological: Positive for dizziness, light-headedness and headaches.  All other systems reviewed and are negative.      Objective:   Physical Exam: 44 year old black female in no apparent distress vital signs stable alert and oriented 3. Pulses are strongly palpable bilateral. Neurologic sensorium is intact percent lasting monofilament. Deep tendon reflexes are intact bilateral muscle strength was 5 over 5 dorsiflexion plantar flexors and inverters everters all intrinsic musculature is intact. Orthopedic evaluation and x-rays all joints distal to the ankle for range of motion without crepitation. Cutaneous evaluation demonstrates supple well-hydrated cutis with thick yellow dystrophic nails of the hallux left with subungual debris fifth digit bilateral.        Assessment & Plan:  Assessment: Nail dystrophy cannot rule out onychomycosis bilateral.  Plan: To samples of the nail scan today to be sent for pathologic evaluation will follow up with her upon return of the results.  Roselind Messier DPM

## 2015-09-04 ENCOUNTER — Ambulatory Visit: Admission: RE | Admit: 2015-09-04 | Payer: 59 | Source: Ambulatory Visit

## 2015-09-06 ENCOUNTER — Encounter: Payer: Self-pay | Admitting: Physical Medicine & Rehabilitation

## 2015-09-09 ENCOUNTER — Ambulatory Visit
Admission: RE | Admit: 2015-09-09 | Discharge: 2015-09-09 | Disposition: A | Discharge status: Home / Self Care | Payer: 59 | Source: Ambulatory Visit | Attending: General Surgery | Admitting: General Surgery

## 2015-09-09 DIAGNOSIS — Z8509 Personal history of malignant neoplasm of other digestive organs: Secondary | ICD-10-CM | POA: Diagnosis present

## 2015-09-09 DIAGNOSIS — K4090 Unilateral inguinal hernia, without obstruction or gangrene, not specified as recurrent: Secondary | ICD-10-CM | POA: Diagnosis not present

## 2015-09-09 MED ORDER — IOHEXOL 300 MG/ML  SOLN
100.0000 mL | Freq: Once | INTRAMUSCULAR | Status: AC | PRN
  Administered 2015-09-09: 100 mL via INTRAVENOUS

## 2015-09-10 ENCOUNTER — Telehealth: Payer: Self-pay

## 2015-09-10 NOTE — Telephone Encounter (Signed)
Spoke with patient about coming in to discuss plans and CT results. Patient scheduled to follow up here on 15th at 3:30 pm.

## 2015-09-10 NOTE — Telephone Encounter (Signed)
-----   Message from Christene Lye, MD sent at 09/10/2015  8:01 AM EST ----- Need to see pt to discuss plans

## 2015-09-19 ENCOUNTER — Ambulatory Visit: Payer: 59 | Admitting: General Surgery

## 2015-09-20 ENCOUNTER — Ambulatory Visit: Payer: 59 | Admitting: Gastroenterology

## 2015-09-25 ENCOUNTER — Telehealth: Payer: Self-pay | Admitting: *Deleted

## 2015-09-25 NOTE — Telephone Encounter (Addendum)
Dr Milinda Pointer reviewed fungal culture results 09/03/2015 as negative, recommended urea cream, [id number].  Informed pt of recommendations and pt states will purchase [id number].

## 2015-10-11 ENCOUNTER — Encounter: Payer: Self-pay | Admitting: Podiatry

## 2015-10-23 ENCOUNTER — Encounter
Payer: BLUE CROSS/BLUE SHIELD | Attending: Physical Medicine & Rehabilitation | Admitting: Physical Medicine & Rehabilitation

## 2015-10-23 ENCOUNTER — Encounter: Payer: Self-pay | Admitting: Physical Medicine & Rehabilitation

## 2015-10-23 VITALS — BP 142/102 | HR 79 | Resp 16

## 2015-10-23 DIAGNOSIS — M5136 Other intervertebral disc degeneration, lumbar region: Secondary | ICD-10-CM | POA: Insufficient documentation

## 2015-10-23 DIAGNOSIS — F329 Major depressive disorder, single episode, unspecified: Secondary | ICD-10-CM | POA: Insufficient documentation

## 2015-10-23 DIAGNOSIS — M79606 Pain in leg, unspecified: Secondary | ICD-10-CM | POA: Insufficient documentation

## 2015-10-23 DIAGNOSIS — G894 Chronic pain syndrome: Secondary | ICD-10-CM | POA: Diagnosis not present

## 2015-10-23 DIAGNOSIS — F419 Anxiety disorder, unspecified: Secondary | ICD-10-CM | POA: Insufficient documentation

## 2015-10-23 DIAGNOSIS — D649 Anemia, unspecified: Secondary | ICD-10-CM | POA: Diagnosis not present

## 2015-10-23 DIAGNOSIS — I10 Essential (primary) hypertension: Secondary | ICD-10-CM | POA: Diagnosis not present

## 2015-10-23 DIAGNOSIS — D259 Leiomyoma of uterus, unspecified: Secondary | ICD-10-CM | POA: Insufficient documentation

## 2015-10-23 DIAGNOSIS — M549 Dorsalgia, unspecified: Secondary | ICD-10-CM | POA: Diagnosis not present

## 2015-10-23 DIAGNOSIS — M961 Postlaminectomy syndrome, not elsewhere classified: Secondary | ICD-10-CM | POA: Diagnosis not present

## 2015-10-23 DIAGNOSIS — Z9221 Personal history of antineoplastic chemotherapy: Secondary | ICD-10-CM | POA: Diagnosis not present

## 2015-10-23 DIAGNOSIS — M5416 Radiculopathy, lumbar region: Secondary | ICD-10-CM | POA: Insufficient documentation

## 2015-10-23 DIAGNOSIS — D481 Neoplasm of uncertain behavior of connective and other soft tissue: Secondary | ICD-10-CM | POA: Diagnosis not present

## 2015-10-23 DIAGNOSIS — F17210 Nicotine dependence, cigarettes, uncomplicated: Secondary | ICD-10-CM | POA: Diagnosis not present

## 2015-10-23 DIAGNOSIS — Z5181 Encounter for therapeutic drug level monitoring: Secondary | ICD-10-CM

## 2015-10-23 DIAGNOSIS — H5442 Blindness, left eye, normal vision right eye: Secondary | ICD-10-CM | POA: Insufficient documentation

## 2015-10-23 DIAGNOSIS — Z79899 Other long term (current) drug therapy: Secondary | ICD-10-CM

## 2015-10-23 MED ORDER — GABAPENTIN 100 MG PO CAPS: 100.0000 mg | ORAL_CAPSULE | Freq: Three times a day (TID) | ORAL | Status: DC

## 2015-10-23 MED ORDER — BACLOFEN 10 MG PO TABS: 10.0000 mg | ORAL_TABLET | Freq: Three times a day (TID) | ORAL | Status: DC | PRN

## 2015-10-23 NOTE — Patient Instructions (Addendum)
ONCE I HAVE CONFIRMATION THAT YOUR URINE SPECIMEN IS CONSISTENT WITH YOUR HISTORY AND PRESCRIBED MEDICATIONS, I WILL BE WILLING TO PRESCRIBE YOUR PAIN MEDICATION. THE RESULTS OF YOUR URINE TESTING COULD TAKE A WEEK OR MORE TO RETURN, HOWEVER.  IF WE DO NOT CONTACT YOU REGARDING THESE RESULTS WITHIN 10 DAYS, PLEASE CONTACT us.       GABAPENTIN: 100MG,  TAKE ONCE AT NIGHT FOR 4 DAYS, THEN TWICE DAILY FOR 4 DAYS, THEN 3  X DAILY THEREAFTER      Low Back Strain With Rehab A strain is an injury in which a tendon or muscle is torn. The muscles and tendons of the lower back are vulnerable to strains. However, these muscles and tendons are very strong and require a great force to be injured. Strains are classified into three categories. Grade 1 strains cause pain, but the tendon is not lengthened. Grade 2 strains include a lengthened ligament, due to the ligament being stretched or partially ruptured. With grade 2 strains there is still function, although the function may be decreased. Grade 3 strains involve a complete tear of the tendon or muscle, and function is usually impaired. SYMPTOMS   Pain in the lower back.  Pain that affects one side more than the other.  Pain that gets worse with movement and may be felt in the hip, buttocks, or back of the thigh.  Muscle spasms of the muscles in the back.  Swelling along the muscles of the back.  Loss of strength of the back muscles.  Crackling sound (crepitation) when the muscles are touched. CAUSES  Lower back strains occur when a force is placed on the muscles or tendons that is greater than they can handle. Common causes of injury include:  Prolonged overuse of the muscle-tendon units in the lower back, usually from incorrect posture.  A single violent injury or force applied to the back. RISK INCREASES WITH:  Sports that involve twisting forces on the spine or a lot of bending at the waist (football, rugby, weightlifting, bowling,  golf, tennis, speed skating, racquetball, swimming, running, gymnastics, diving).  Poor strength and flexibility.  Failure to warm up properly before activity.  Family history of lower back pain or disk disorders.  Previous back injury or surgery (especially fusion).  Poor posture with lifting, especially heavy objects.  Prolonged sitting, especially with poor posture. PREVENTION   Learn and use proper posture when sitting or lifting (maintain proper posture when sitting, lift using the knees and legs, not at the waist).  Warm up and stretch properly before activity.  Allow for adequate recovery between workouts.  Maintain physical fitness:  Strength, flexibility, and endurance.  Cardiovascular fitness. PROGNOSIS  If treated properly, lower back strains usually heal within 6 weeks. RELATED COMPLICATIONS   Recurring symptoms, resulting in a chronic problem.  Chronic inflammation, scarring, and partial muscle-tendon tear.  Delayed healing or resolution of symptoms.  Prolonged disability. TREATMENT  Treatment first involves the use of ice and medicine, to reduce pain and inflammation. The use of strengthening and stretching exercises may help reduce pain with activity. These exercises may be performed at home or with a therapist. Severe injuries may require referral to a therapist for further evaluation and treatment, such as ultrasound. Your caregiver may advise that you wear a back brace or corset, to help reduce pain and discomfort. Often, prolonged bed rest results in greater harm then benefit. Corticosteroid injections may be recommended. However, these should be reserved for the most serious cases.  It is important to avoid using your back when lifting objects. At night, sleep on your back on a firm mattress with a pillow placed under your knees. If non-surgical treatment is unsuccessful, surgery may be needed.  MEDICATION   If pain medicine is needed, nonsteroidal  anti-inflammatory medicines (aspirin and ibuprofen), or other minor pain relievers (acetaminophen), are often advised.  Do not take pain medicine for 7 days before surgery.  Prescription pain relievers may be given, if your caregiver thinks they are needed. Use only as directed and only as much as you need.  Ointments applied to the skin may be helpful.  Corticosteroid injections may be given by your caregiver. These injections should be reserved for the most serious cases, because they may only be given a certain number of times. HEAT AND COLD  Cold treatment (icing) should be applied for 10 to 15 minutes every 2 to 3 hours for inflammation and pain, and immediately after activity that aggravates your symptoms. Use ice packs or an ice massage.  Heat treatment may be used before performing stretching and strengthening activities prescribed by your caregiver, physical therapist, or athletic trainer. Use a heat pack or a warm water soak. SEEK MEDICAL CARE IF:   Symptoms get worse or do not improve in 2 to 4 weeks, despite treatment.  You develop numbness, weakness, or loss of bowel or bladder function.  New, unexplained symptoms develop. (Drugs used in treatment may produce side effects.) EXERCISES  RANGE OF MOTION (ROM) AND STRETCHING EXERCISES - Low Back Strain Most people with lower back pain will find that their symptoms get worse with excessive bending forward (flexion) or arching at the lower back (extension). The exercises which will help resolve your symptoms will focus on the opposite motion.  Your physician, physical therapist or athletic trainer will help you determine which exercises will be most helpful to resolve your lower back pain. Do not complete any exercises without first consulting with your caregiver. Discontinue any exercises which make your symptoms worse until you speak to your caregiver.  If you have pain, numbness or tingling which travels down into your buttocks,  leg or foot, the goal of the therapy is for these symptoms to move closer to your back and eventually resolve. Sometimes, these leg symptoms will get better, but your lower back pain may worsen. This is typically an indication of progress in your rehabilitation. Be very alert to any changes in your symptoms and the activities in which you participated in the 24 hours prior to the change. Sharing this information with your caregiver will allow him/her to most efficiently treat your condition.  These exercises may help you when beginning to rehabilitate your injury. Your symptoms may resolve with or without further involvement from your physician, physical therapist or athletic trainer. While completing these exercises, remember:  Restoring tissue flexibility helps normal motion to return to the joints. This allows healthier, less painful movement and activity.  An effective stretch should be held for at least 30 seconds.  A stretch should never be painful. You should only feel a gentle lengthening or release in the stretched tissue. FLEXION RANGE OF MOTION AND STRETCHING EXERCISES: STRETCH - Flexion, Single Knee to Chest   Lie on a firm bed or floor with both legs extended in front of you.  Keeping one leg in contact with the floor, bring your opposite knee to your chest. Hold your leg in place by either grabbing behind your thigh or at your knee.  Pull until you feel a gentle stretch in your lower back. Hold __________ seconds.  Slowly release your grasp and repeat the exercise with the opposite side. Repeat __________ times. Complete this exercise __________ times per day.  STRETCH - Flexion, Double Knee to Chest   Lie on a firm bed or floor with both legs extended in front of you.  Keeping one leg in contact with the floor, bring your opposite knee to your chest.  Tense your stomach muscles to support your back and then lift your other knee to your chest. Hold your legs in place by either  grabbing behind your thighs or at your knees.  Pull both knees toward your chest until you feel a gentle stretch in your lower back. Hold __________ seconds.  Tense your stomach muscles and slowly return one leg at a time to the floor. Repeat __________ times. Complete this exercise __________ times per day.  STRETCH - Low Trunk Rotation  Lie on a firm bed or floor. Keeping your legs in front of you, bend your knees so they are both pointed toward the ceiling and your feet are flat on the floor.  Extend your arms out to the side. This will stabilize your upper body by keeping your shoulders in contact with the floor.  Gently and slowly drop both knees together to one side until you feel a gentle stretch in your lower back. Hold for __________ seconds.  Tense your stomach muscles to support your lower back as you bring your knees back to the starting position. Repeat the exercise to the other side. Repeat __________ times. Complete this exercise __________ times per day  EXTENSION RANGE OF MOTION AND FLEXIBILITY EXERCISES: STRETCH - Extension, Prone on Elbows   Lie on your stomach on the floor, a bed will be too soft. Place your palms about shoulder width apart and at the height of your head.  Place your elbows under your shoulders. If this is too painful, stack pillows under your chest.  Allow your body to relax so that your hips drop lower and make contact more completely with the floor.  Hold this position for __________ seconds.  Slowly return to lying flat on the floor. Repeat __________ times. Complete this exercise __________ times per day.  RANGE OF MOTION - Extension, Prone Press Ups  Lie on your stomach on the floor, a bed will be too soft. Place your palms about shoulder width apart and at the height of your head.  Keeping your back as relaxed as possible, slowly straighten your elbows while keeping your hips on the floor. You may adjust the placement of your hands to  maximize your comfort. As you gain motion, your hands will come more underneath your shoulders.  Hold this position __________ seconds.  Slowly return to lying flat on the floor. Repeat __________ times. Complete this exercise __________ times per day.  RANGE OF MOTION- Quadruped, Neutral Spine   Assume a hands and knees position on a firm surface. Keep your hands under your shoulders and your knees under your hips. You may place padding under your knees for comfort.  Drop your head and point your tail bone toward the ground below you. This will round out your lower back like an angry cat. Hold this position for __________ seconds.  Slowly lift your head and release your tail bone so that your back sags into a large arch, like an old horse.  Hold this position for __________ seconds.  Repeat this until  you feel limber in your lower back.  Now, find your "sweet spot." This will be the most comfortable position somewhere between the two previous positions. This is your neutral spine. Once you have found this position, tense your stomach muscles to support your lower back.  Hold this position for __________ seconds. Repeat __________ times. Complete this exercise __________ times per day.  STRENGTHENING EXERCISES - Low Back Strain These exercises may help you when beginning to rehabilitate your injury. These exercises should be done near your "sweet spot." This is the neutral, low-back arch, somewhere between fully rounded and fully arched, that is your least painful position. When performed in this safe range of motion, these exercises can be used for people who have either a flexion or extension based injury. These exercises may resolve your symptoms with or without further involvement from your physician, physical therapist or athletic trainer. While completing these exercises, remember:   Muscles can gain both the endurance and the strength needed for everyday activities through controlled  exercises.  Complete these exercises as instructed by your physician, physical therapist or athletic trainer. Increase the resistance and repetitions only as guided.  You may experience muscle soreness or fatigue, but the pain or discomfort you are trying to eliminate should never worsen during these exercises. If this pain does worsen, stop and make certain you are following the directions exactly. If the pain is still present after adjustments, discontinue the exercise until you can discuss the trouble with your caregiver. STRENGTHENING - Deep Abdominals, Pelvic Tilt  Lie on a firm bed or floor. Keeping your legs in front of you, bend your knees so they are both pointed toward the ceiling and your feet are flat on the floor.  Tense your lower abdominal muscles to press your lower back into the floor. This motion will rotate your pelvis so that your tail bone is scooping upwards rather than pointing at your feet or into the floor.  With a gentle tension and even breathing, hold this position for __________ seconds. Repeat __________ times. Complete this exercise __________ times per day.  STRENGTHENING - Abdominals, Crunches   Lie on a firm bed or floor. Keeping your legs in front of you, bend your knees so they are both pointed toward the ceiling and your feet are flat on the floor. Cross your arms over your chest.  Slightly tip your chin down without bending your neck.  Tense your abdominals and slowly lift your trunk high enough to just clear your shoulder blades. Lifting higher can put excessive stress on the lower back and does not further strengthen your abdominal muscles.  Control your return to the starting position. Repeat __________ times. Complete this exercise __________ times per day.  STRENGTHENING - Quadruped, Opposite UE/LE Lift   Assume a hands and knees position on a firm surface. Keep your hands under your shoulders and your knees under your hips. You may place padding  under your knees for comfort.  Find your neutral spine and gently tense your abdominal muscles so that you can maintain this position. Your shoulders and hips should form a rectangle that is parallel with the floor and is not twisted.  Keeping your trunk steady, lift your right hand no higher than your shoulder and then your left leg no higher than your hip. Make sure you are not holding your breath. Hold this position __________ seconds.  Continuing to keep your abdominal muscles tense and your back steady, slowly return to your starting position.  Repeat with the opposite arm and leg. Repeat __________ times. Complete this exercise __________ times per day.  STRENGTHENING - Lower Abdominals, Double Knee Lift  Lie on a firm bed or floor. Keeping your legs in front of you, bend your knees so they are both pointed toward the ceiling and your feet are flat on the floor.  Tense your abdominal muscles to brace your lower back and slowly lift both of your knees until they come over your hips. Be certain not to hold your breath.  Hold __________ seconds. Using your abdominal muscles, return to the starting position in a slow and controlled manner. Repeat __________ times. Complete this exercise __________ times per day.  POSTURE AND BODY MECHANICS CONSIDERATIONS - Low Back Strain Keeping correct posture when sitting, standing or completing your activities will reduce the stress put on different body tissues, allowing injured tissues a chance to heal and limiting painful experiences. The following are general guidelines for improved posture. Your physician or physical therapist will provide you with any instructions specific to your needs. While reading these guidelines, remember:  The exercises prescribed by your provider will help you have the flexibility and strength to maintain correct postures.  The correct posture provides the best environment for your joints to work. All of your joints have less  wear and tear when properly supported by a spine with good posture. This means you will experience a healthier, less painful body.  Correct posture must be practiced with all of your activities, especially prolonged sitting and standing. Correct posture is as important when doing repetitive low-stress activities (typing) as it is when doing a single heavy-load activity (lifting). RESTING POSITIONS Consider which positions are most painful for you when choosing a resting position. If you have pain with flexion-based activities (sitting, bending, stooping, squatting), choose a position that allows you to rest in a less flexed posture. You would want to avoid curling into a fetal position on your side. If your pain worsens with extension-based activities (prolonged standing, working overhead), avoid resting in an extended position such as sleeping on your stomach. Most people will find more comfort when they rest with their spine in a more neutral position, neither too rounded nor too arched. Lying on a non-sagging bed on your side with a pillow between your knees, or on your back with a pillow under your knees will often provide some relief. Keep in mind, being in any one position for a prolonged period of time, no matter how correct your posture, can still lead to stiffness. PROPER SITTING POSTURE In order to minimize stress and discomfort on your spine, you must sit with correct posture. Sitting with good posture should be effortless for a healthy body. Returning to good posture is a gradual process. Many people can work toward this most comfortably by using various supports until they have the flexibility and strength to maintain this posture on their own. When sitting with proper posture, your ears will fall over your shoulders and your shoulders will fall over your hips. You should use the back of the chair to support your upper back. Your lower back will be in a neutral position, just slightly arched. You  may place a small pillow or folded towel at the base of your lower back for support.  When working at a desk, create an environment that supports good, upright posture. Without extra support, muscles tire, which leads to excessive strain on joints and other tissues. Keep these recommendations in mind: CHAIR:  A chair should be able to slide under your desk when your back makes contact with the back of the chair. This allows you to work closely.  The chair's height should allow your eyes to be level with the upper part of your monitor and your hands to be slightly lower than your elbows. BODY POSITION  Your feet should make contact with the floor. If this is not possible, use a foot rest.  Keep your ears over your shoulders. This will reduce stress on your neck and lower back. INCORRECT SITTING POSTURES  If you are feeling tired and unable to assume a healthy sitting posture, do not slouch or slump. This puts excessive strain on your back tissues, causing more damage and pain. Healthier options include:  Using more support, like a lumbar pillow.  Switching tasks to something that requires you to be upright or walking.  Talking a brief walk.  Lying down to rest in a neutral-spine position. PROLONGED STANDING WHILE SLIGHTLY LEANING FORWARD  When completing a task that requires you to lean forward while standing in one place for a long time, place either foot up on a stationary 2-4 inch high object to help maintain the best posture. When both feet are on the ground, the lower back tends to lose its slight inward curve. If this curve flattens (or becomes too large), then the back and your other joints will experience too much stress, tire more quickly, and can cause pain. CORRECT STANDING POSTURES Proper standing posture should be assumed with all daily activities, even if they only take a few moments, like when brushing your teeth. As in sitting, your ears should fall over your shoulders and  your shoulders should fall over your hips. You should keep a slight tension in your abdominal muscles to brace your spine. Your tailbone should point down to the ground, not behind your body, resulting in an over-extended swayback posture.  INCORRECT STANDING POSTURES  Common incorrect standing postures include a forward head, locked knees and/or an excessive swayback. WALKING Walk with an upright posture. Your ears, shoulders and hips should all line-up. PROLONGED ACTIVITY IN A FLEXED POSITION When completing a task that requires you to bend forward at your waist or lean over a low surface, try to find a way to stabilize 3 out of 4 of your limbs. You can place a hand or elbow on your thigh or rest a knee on the surface you are reaching across. This will provide you more stability so that your muscles do not fatigue as quickly. By keeping your knees relaxed, or slightly bent, you will also reduce stress across your lower back. CORRECT LIFTING TECHNIQUES DO :   Assume a wide stance. This will provide you more stability and the opportunity to get as close as possible to the object which you are lifting.  Tense your abdominals to brace your spine. Bend at the knees and hips. Keeping your back locked in a neutral-spine position, lift using your leg muscles. Lift with your legs, keeping your back straight.  Test the weight of unknown objects before attempting to lift them.  Try to keep your elbows locked down at your sides in order get the best strength from your shoulders when carrying an object.  Always ask for help when lifting heavy or awkward objects. INCORRECT LIFTING TECHNIQUES DO NOT:   Lock your knees when lifting, even if it is a small object.  Bend and twist. Pivot at your feet or move your feet  when needing to change directions.  Assume that you can safely pick up even a paper clip without proper posture.   This information is not intended to replace advice given to you by your  health care provider. Make sure you discuss any questions you have with your health care provider.   Document Released: 09/21/2005 Document Revised: 10/12/2014 Document Reviewed: 01/03/2009 [employer] Interactive Patient Education [employer].
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
## 2015-10-27 LAB — TOXASSURE SELECT,+ANTIDEPR,UR: PDF: 0

## 2015-10-28 NOTE — Progress Notes (Signed)
Urine drug screen for this encounter is consistent for no reported/ prescribed medication

## 2015-11-06 ENCOUNTER — Ambulatory Visit: Payer: Self-pay | Admitting: General Surgery

## 2015-11-12 ENCOUNTER — Ambulatory Visit: Payer: Self-pay | Admitting: General Surgery

## 2015-11-20 ENCOUNTER — Encounter: Payer: Self-pay | Admitting: Registered Nurse

## 2015-11-20 ENCOUNTER — Encounter: Payer: BLUE CROSS/BLUE SHIELD | Attending: Physical Medicine & Rehabilitation | Admitting: Registered Nurse

## 2015-11-20 VITALS — BP 123/92 | HR 80 | Resp 14

## 2015-11-20 DIAGNOSIS — D259 Leiomyoma of uterus, unspecified: Secondary | ICD-10-CM | POA: Diagnosis not present

## 2015-11-20 DIAGNOSIS — I10 Essential (primary) hypertension: Secondary | ICD-10-CM | POA: Diagnosis not present

## 2015-11-20 DIAGNOSIS — M549 Dorsalgia, unspecified: Secondary | ICD-10-CM | POA: Insufficient documentation

## 2015-11-20 DIAGNOSIS — M79606 Pain in leg, unspecified: Secondary | ICD-10-CM | POA: Insufficient documentation

## 2015-11-20 DIAGNOSIS — D481 Neoplasm of uncertain behavior of connective and other soft tissue: Secondary | ICD-10-CM | POA: Insufficient documentation

## 2015-11-20 DIAGNOSIS — F419 Anxiety disorder, unspecified: Secondary | ICD-10-CM | POA: Insufficient documentation

## 2015-11-20 DIAGNOSIS — M5416 Radiculopathy, lumbar region: Secondary | ICD-10-CM | POA: Diagnosis present

## 2015-11-20 DIAGNOSIS — H5442 Blindness, left eye, normal vision right eye: Secondary | ICD-10-CM | POA: Diagnosis not present

## 2015-11-20 DIAGNOSIS — Z79899 Other long term (current) drug therapy: Secondary | ICD-10-CM | POA: Diagnosis not present

## 2015-11-20 DIAGNOSIS — M62838 Other muscle spasm: Secondary | ICD-10-CM

## 2015-11-20 DIAGNOSIS — F17210 Nicotine dependence, cigarettes, uncomplicated: Secondary | ICD-10-CM | POA: Diagnosis not present

## 2015-11-20 DIAGNOSIS — M961 Postlaminectomy syndrome, not elsewhere classified: Secondary | ICD-10-CM | POA: Insufficient documentation

## 2015-11-20 DIAGNOSIS — F329 Major depressive disorder, single episode, unspecified: Secondary | ICD-10-CM | POA: Diagnosis not present

## 2015-11-20 DIAGNOSIS — M5136 Other intervertebral disc degeneration, lumbar region: Secondary | ICD-10-CM | POA: Diagnosis not present

## 2015-11-20 DIAGNOSIS — Z5181 Encounter for therapeutic drug level monitoring: Secondary | ICD-10-CM | POA: Diagnosis not present

## 2015-11-20 DIAGNOSIS — Z9221 Personal history of antineoplastic chemotherapy: Secondary | ICD-10-CM | POA: Insufficient documentation

## 2015-11-20 DIAGNOSIS — G894 Chronic pain syndrome: Secondary | ICD-10-CM | POA: Diagnosis not present

## 2015-11-20 DIAGNOSIS — D649 Anemia, unspecified: Secondary | ICD-10-CM | POA: Diagnosis not present

## 2015-11-20 MED ORDER — HYDROCODONE-ACETAMINOPHEN 5-325 MG PO TABS: 1.0000 | ORAL_TABLET | Freq: Two times a day (BID) | ORAL | Status: DC | PRN
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
## 2015-11-28 ENCOUNTER — Encounter: Payer: Self-pay | Admitting: General Surgery

## 2015-11-28 ENCOUNTER — Ambulatory Visit (INDEPENDENT_AMBULATORY_CARE_PROVIDER_SITE_OTHER): Payer: BLUE CROSS/BLUE SHIELD | Admitting: General Surgery

## 2015-11-28 VITALS — BP 148/84 | HR 84 | Resp 14 | Ht 69.0 in | Wt 185.0 lb

## 2015-11-28 DIAGNOSIS — K4090 Unilateral inguinal hernia, without obstruction or gangrene, not specified as recurrent: Secondary | ICD-10-CM | POA: Diagnosis not present

## 2015-11-28 DIAGNOSIS — Z8509 Personal history of malignant neoplasm of other digestive organs: Secondary | ICD-10-CM

## 2015-11-28 NOTE — Patient Instructions (Addendum)
The patient is aware to call back for any questions or concerns.  

## 2015-11-28 NOTE — Progress Notes (Signed)
Patient here today to discuss CT results. CT showed the left inguinal hernia and a very large uterine fibroid. Discussed fully with pt regarding hernia repair. Pros and cons of placing mesh at same time as hysterectomy discussed. It may be better to repair hernia first and hysterectomy later. Pt would like to have both procedures done at same time. Her gynecologist is in [place]. She will think about it and let us know.       PCP:  Chana Bode This information has been scribed by Karie Fetch Ashton.

## 2015-11-30 ENCOUNTER — Encounter: Payer: Self-pay | Admitting: General Surgery

## 2015-12-17 ENCOUNTER — Encounter: Payer: BLUE CROSS/BLUE SHIELD | Attending: Physical Medicine & Rehabilitation | Admitting: Registered Nurse

## 2015-12-17 DIAGNOSIS — F329 Major depressive disorder, single episode, unspecified: Secondary | ICD-10-CM | POA: Insufficient documentation

## 2015-12-17 DIAGNOSIS — M961 Postlaminectomy syndrome, not elsewhere classified: Secondary | ICD-10-CM | POA: Insufficient documentation

## 2015-12-17 DIAGNOSIS — Z9221 Personal history of antineoplastic chemotherapy: Secondary | ICD-10-CM | POA: Insufficient documentation

## 2015-12-17 DIAGNOSIS — D481 Neoplasm of uncertain behavior of connective and other soft tissue: Secondary | ICD-10-CM | POA: Insufficient documentation

## 2015-12-17 DIAGNOSIS — I10 Essential (primary) hypertension: Secondary | ICD-10-CM | POA: Insufficient documentation

## 2015-12-17 DIAGNOSIS — M79606 Pain in leg, unspecified: Secondary | ICD-10-CM | POA: Insufficient documentation

## 2015-12-17 DIAGNOSIS — H5442 Blindness, left eye, normal vision right eye: Secondary | ICD-10-CM | POA: Insufficient documentation

## 2015-12-17 DIAGNOSIS — M5416 Radiculopathy, lumbar region: Secondary | ICD-10-CM | POA: Insufficient documentation

## 2015-12-17 DIAGNOSIS — M549 Dorsalgia, unspecified: Secondary | ICD-10-CM | POA: Insufficient documentation

## 2015-12-17 DIAGNOSIS — F17210 Nicotine dependence, cigarettes, uncomplicated: Secondary | ICD-10-CM | POA: Insufficient documentation

## 2015-12-17 DIAGNOSIS — M5136 Other intervertebral disc degeneration, lumbar region: Secondary | ICD-10-CM | POA: Insufficient documentation

## 2015-12-17 DIAGNOSIS — D649 Anemia, unspecified: Secondary | ICD-10-CM | POA: Insufficient documentation

## 2015-12-17 DIAGNOSIS — F419 Anxiety disorder, unspecified: Secondary | ICD-10-CM | POA: Insufficient documentation

## 2015-12-17 DIAGNOSIS — D259 Leiomyoma of uterus, unspecified: Secondary | ICD-10-CM | POA: Insufficient documentation

## 2016-01-02 ENCOUNTER — Encounter: Payer: BLUE CROSS/BLUE SHIELD | Admitting: Registered Nurse

## 2016-01-03 ENCOUNTER — Encounter (HOSPITAL_BASED_OUTPATIENT_CLINIC_OR_DEPARTMENT_OTHER): Payer: BLUE CROSS/BLUE SHIELD | Admitting: Registered Nurse

## 2016-01-03 ENCOUNTER — Encounter: Payer: Self-pay | Admitting: Registered Nurse

## 2016-01-03 VITALS — BP 123/87 | HR 69

## 2016-01-03 DIAGNOSIS — D481 Neoplasm of uncertain behavior of connective and other soft tissue: Secondary | ICD-10-CM | POA: Diagnosis not present

## 2016-01-03 DIAGNOSIS — M549 Dorsalgia, unspecified: Secondary | ICD-10-CM | POA: Diagnosis not present

## 2016-01-03 DIAGNOSIS — Z5181 Encounter for therapeutic drug level monitoring: Secondary | ICD-10-CM

## 2016-01-03 DIAGNOSIS — Z79899 Other long term (current) drug therapy: Secondary | ICD-10-CM

## 2016-01-03 DIAGNOSIS — M961 Postlaminectomy syndrome, not elsewhere classified: Secondary | ICD-10-CM | POA: Diagnosis present

## 2016-01-03 DIAGNOSIS — D649 Anemia, unspecified: Secondary | ICD-10-CM | POA: Diagnosis not present

## 2016-01-03 DIAGNOSIS — M5136 Other intervertebral disc degeneration, lumbar region: Secondary | ICD-10-CM | POA: Diagnosis not present

## 2016-01-03 DIAGNOSIS — F329 Major depressive disorder, single episode, unspecified: Secondary | ICD-10-CM | POA: Diagnosis not present

## 2016-01-03 DIAGNOSIS — D259 Leiomyoma of uterus, unspecified: Secondary | ICD-10-CM | POA: Diagnosis not present

## 2016-01-03 DIAGNOSIS — I10 Essential (primary) hypertension: Secondary | ICD-10-CM | POA: Diagnosis not present

## 2016-01-03 DIAGNOSIS — M5416 Radiculopathy, lumbar region: Secondary | ICD-10-CM | POA: Diagnosis present

## 2016-01-03 DIAGNOSIS — F17210 Nicotine dependence, cigarettes, uncomplicated: Secondary | ICD-10-CM | POA: Diagnosis not present

## 2016-01-03 DIAGNOSIS — G894 Chronic pain syndrome: Secondary | ICD-10-CM | POA: Diagnosis not present

## 2016-01-03 DIAGNOSIS — F419 Anxiety disorder, unspecified: Secondary | ICD-10-CM | POA: Diagnosis not present

## 2016-01-03 DIAGNOSIS — M62838 Other muscle spasm: Secondary | ICD-10-CM

## 2016-01-03 DIAGNOSIS — Z9221 Personal history of antineoplastic chemotherapy: Secondary | ICD-10-CM | POA: Diagnosis not present

## 2016-01-03 DIAGNOSIS — H5442 Blindness, left eye, normal vision right eye: Secondary | ICD-10-CM | POA: Diagnosis not present

## 2016-01-03 DIAGNOSIS — M79606 Pain in leg, unspecified: Secondary | ICD-10-CM | POA: Diagnosis not present

## 2016-01-03 MED ORDER — HYDROCODONE-ACETAMINOPHEN 5-325 MG PO TABS: 1.0000 | ORAL_TABLET | Freq: Two times a day (BID) | ORAL | Status: DC | PRN
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
## 2016-01-10 ENCOUNTER — Other Ambulatory Visit: Payer: Self-pay

## 2016-01-10 DIAGNOSIS — Z1231 Encounter for screening mammogram for malignant neoplasm of breast: Secondary | ICD-10-CM

## 2016-01-31 ENCOUNTER — Ambulatory Visit: Payer: BLUE CROSS/BLUE SHIELD

## 2016-02-03 ENCOUNTER — Encounter: Payer: BLUE CROSS/BLUE SHIELD | Attending: Physical Medicine & Rehabilitation | Admitting: Registered Nurse

## 2016-02-03 DIAGNOSIS — D481 Neoplasm of uncertain behavior of connective and other soft tissue: Secondary | ICD-10-CM | POA: Insufficient documentation

## 2016-02-03 DIAGNOSIS — M961 Postlaminectomy syndrome, not elsewhere classified: Secondary | ICD-10-CM | POA: Insufficient documentation

## 2016-02-03 DIAGNOSIS — F17210 Nicotine dependence, cigarettes, uncomplicated: Secondary | ICD-10-CM | POA: Insufficient documentation

## 2016-02-03 DIAGNOSIS — D259 Leiomyoma of uterus, unspecified: Secondary | ICD-10-CM | POA: Insufficient documentation

## 2016-02-03 DIAGNOSIS — D649 Anemia, unspecified: Secondary | ICD-10-CM | POA: Insufficient documentation

## 2016-02-03 DIAGNOSIS — F329 Major depressive disorder, single episode, unspecified: Secondary | ICD-10-CM | POA: Insufficient documentation

## 2016-02-03 DIAGNOSIS — M5416 Radiculopathy, lumbar region: Secondary | ICD-10-CM | POA: Insufficient documentation

## 2016-02-03 DIAGNOSIS — Z9221 Personal history of antineoplastic chemotherapy: Secondary | ICD-10-CM | POA: Insufficient documentation

## 2016-02-03 DIAGNOSIS — M5136 Other intervertebral disc degeneration, lumbar region: Secondary | ICD-10-CM | POA: Insufficient documentation

## 2016-02-03 DIAGNOSIS — F419 Anxiety disorder, unspecified: Secondary | ICD-10-CM | POA: Insufficient documentation

## 2016-02-03 DIAGNOSIS — H5442 Blindness, left eye, normal vision right eye: Secondary | ICD-10-CM | POA: Insufficient documentation

## 2016-02-03 DIAGNOSIS — M549 Dorsalgia, unspecified: Secondary | ICD-10-CM | POA: Insufficient documentation

## 2016-02-03 DIAGNOSIS — I10 Essential (primary) hypertension: Secondary | ICD-10-CM | POA: Insufficient documentation

## 2016-02-03 DIAGNOSIS — M79606 Pain in leg, unspecified: Secondary | ICD-10-CM | POA: Insufficient documentation

## 2016-02-06 ENCOUNTER — Encounter: Payer: BLUE CROSS/BLUE SHIELD | Admitting: Registered Nurse

## 2016-02-07 ENCOUNTER — Encounter: Payer: Self-pay | Admitting: Registered Nurse

## 2016-02-07 ENCOUNTER — Encounter (HOSPITAL_BASED_OUTPATIENT_CLINIC_OR_DEPARTMENT_OTHER): Payer: BLUE CROSS/BLUE SHIELD | Admitting: Registered Nurse

## 2016-02-07 VITALS — BP 153/109 | HR 67 | Resp 14

## 2016-02-07 DIAGNOSIS — G894 Chronic pain syndrome: Secondary | ICD-10-CM | POA: Diagnosis not present

## 2016-02-07 DIAGNOSIS — I10 Essential (primary) hypertension: Secondary | ICD-10-CM | POA: Diagnosis not present

## 2016-02-07 DIAGNOSIS — M5136 Other intervertebral disc degeneration, lumbar region: Secondary | ICD-10-CM | POA: Diagnosis not present

## 2016-02-07 DIAGNOSIS — F329 Major depressive disorder, single episode, unspecified: Secondary | ICD-10-CM | POA: Diagnosis not present

## 2016-02-07 DIAGNOSIS — D481 Neoplasm of uncertain behavior of connective and other soft tissue: Secondary | ICD-10-CM | POA: Diagnosis not present

## 2016-02-07 DIAGNOSIS — M5416 Radiculopathy, lumbar region: Secondary | ICD-10-CM

## 2016-02-07 DIAGNOSIS — F17210 Nicotine dependence, cigarettes, uncomplicated: Secondary | ICD-10-CM | POA: Diagnosis not present

## 2016-02-07 DIAGNOSIS — Z5181 Encounter for therapeutic drug level monitoring: Secondary | ICD-10-CM

## 2016-02-07 DIAGNOSIS — Z79899 Other long term (current) drug therapy: Secondary | ICD-10-CM

## 2016-02-07 DIAGNOSIS — H5442 Blindness, left eye, normal vision right eye: Secondary | ICD-10-CM | POA: Diagnosis not present

## 2016-02-07 DIAGNOSIS — M961 Postlaminectomy syndrome, not elsewhere classified: Secondary | ICD-10-CM

## 2016-02-07 DIAGNOSIS — M549 Dorsalgia, unspecified: Secondary | ICD-10-CM | POA: Diagnosis not present

## 2016-02-07 DIAGNOSIS — M79606 Pain in leg, unspecified: Secondary | ICD-10-CM | POA: Diagnosis not present

## 2016-02-07 DIAGNOSIS — D259 Leiomyoma of uterus, unspecified: Secondary | ICD-10-CM | POA: Diagnosis not present

## 2016-02-07 DIAGNOSIS — F419 Anxiety disorder, unspecified: Secondary | ICD-10-CM | POA: Diagnosis not present

## 2016-02-07 DIAGNOSIS — M62838 Other muscle spasm: Secondary | ICD-10-CM

## 2016-02-07 DIAGNOSIS — D649 Anemia, unspecified: Secondary | ICD-10-CM | POA: Diagnosis not present

## 2016-02-07 DIAGNOSIS — Z9221 Personal history of antineoplastic chemotherapy: Secondary | ICD-10-CM | POA: Diagnosis not present

## 2016-02-07 MED ORDER — HYDROCODONE-ACETAMINOPHEN 5-325 MG PO TABS: 1.0000 | ORAL_TABLET | Freq: Two times a day (BID) | ORAL | Status: DC | PRN
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
## 2016-02-11 ENCOUNTER — Ambulatory Visit
Admission: RE | Admit: 2016-02-11 | Discharge: 2016-02-11 | Disposition: A | Discharge status: Home / Self Care | Payer: BLUE CROSS/BLUE SHIELD | Source: Ambulatory Visit

## 2016-02-11 DIAGNOSIS — Z1231 Encounter for screening mammogram for malignant neoplasm of breast: Secondary | ICD-10-CM

## 2016-02-12 ENCOUNTER — Other Ambulatory Visit: Payer: Self-pay | Admitting: Obstetrics and Gynecology

## 2016-02-12 DIAGNOSIS — R928 Other abnormal and inconclusive findings on diagnostic imaging of breast: Secondary | ICD-10-CM

## 2016-02-25 ENCOUNTER — Ambulatory Visit
Admission: RE | Admit: 2016-02-25 | Discharge: 2016-02-25 | Disposition: A | Discharge status: Home / Self Care | Payer: BLUE CROSS/BLUE SHIELD | Source: Ambulatory Visit | Attending: Obstetrics and Gynecology | Admitting: Obstetrics and Gynecology

## 2016-02-25 ENCOUNTER — Ambulatory Visit: Admission: RE | Admit: 2016-02-25 | Payer: BLUE CROSS/BLUE SHIELD | Source: Ambulatory Visit

## 2016-02-25 DIAGNOSIS — R928 Other abnormal and inconclusive findings on diagnostic imaging of breast: Secondary | ICD-10-CM

## 2016-03-05 ENCOUNTER — Other Ambulatory Visit: Payer: Self-pay | Admitting: Medical

## 2016-03-05 HISTORY — PX: HERNIA REPAIR: SHX51

## 2016-03-06 ENCOUNTER — Encounter: Payer: BLUE CROSS/BLUE SHIELD | Attending: Physical Medicine & Rehabilitation | Admitting: Registered Nurse

## 2016-03-06 ENCOUNTER — Encounter: Payer: Self-pay | Admitting: Registered Nurse

## 2016-03-06 VITALS — BP 141/95 | HR 76 | Resp 14

## 2016-03-06 DIAGNOSIS — D481 Neoplasm of uncertain behavior of connective and other soft tissue: Secondary | ICD-10-CM | POA: Diagnosis not present

## 2016-03-06 DIAGNOSIS — M5136 Other intervertebral disc degeneration, lumbar region: Secondary | ICD-10-CM | POA: Diagnosis not present

## 2016-03-06 DIAGNOSIS — M79606 Pain in leg, unspecified: Secondary | ICD-10-CM | POA: Diagnosis not present

## 2016-03-06 DIAGNOSIS — M5416 Radiculopathy, lumbar region: Secondary | ICD-10-CM | POA: Insufficient documentation

## 2016-03-06 DIAGNOSIS — D259 Leiomyoma of uterus, unspecified: Secondary | ICD-10-CM | POA: Insufficient documentation

## 2016-03-06 DIAGNOSIS — Z9221 Personal history of antineoplastic chemotherapy: Secondary | ICD-10-CM | POA: Diagnosis not present

## 2016-03-06 DIAGNOSIS — D649 Anemia, unspecified: Secondary | ICD-10-CM | POA: Diagnosis not present

## 2016-03-06 DIAGNOSIS — F329 Major depressive disorder, single episode, unspecified: Secondary | ICD-10-CM | POA: Insufficient documentation

## 2016-03-06 DIAGNOSIS — F17210 Nicotine dependence, cigarettes, uncomplicated: Secondary | ICD-10-CM | POA: Diagnosis not present

## 2016-03-06 DIAGNOSIS — H5442 Blindness, left eye, normal vision right eye: Secondary | ICD-10-CM | POA: Insufficient documentation

## 2016-03-06 DIAGNOSIS — G894 Chronic pain syndrome: Secondary | ICD-10-CM | POA: Diagnosis not present

## 2016-03-06 DIAGNOSIS — Z5181 Encounter for therapeutic drug level monitoring: Secondary | ICD-10-CM | POA: Diagnosis not present

## 2016-03-06 DIAGNOSIS — I10 Essential (primary) hypertension: Secondary | ICD-10-CM | POA: Diagnosis not present

## 2016-03-06 DIAGNOSIS — M549 Dorsalgia, unspecified: Secondary | ICD-10-CM | POA: Insufficient documentation

## 2016-03-06 DIAGNOSIS — F419 Anxiety disorder, unspecified: Secondary | ICD-10-CM | POA: Insufficient documentation

## 2016-03-06 DIAGNOSIS — Z79899 Other long term (current) drug therapy: Secondary | ICD-10-CM

## 2016-03-06 DIAGNOSIS — M961 Postlaminectomy syndrome, not elsewhere classified: Secondary | ICD-10-CM | POA: Insufficient documentation

## 2016-03-06 MED ORDER — HYDROCODONE-ACETAMINOPHEN 5-325 MG PO TABS: 1.0000 | ORAL_TABLET | Freq: Two times a day (BID) | ORAL | Status: DC | PRN
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
## 2016-03-09 ENCOUNTER — Other Ambulatory Visit: Payer: Self-pay | Admitting: General Surgery

## 2016-03-11 ENCOUNTER — Telehealth: Payer: Self-pay | Admitting: Physical Medicine & Rehabilitation

## 2016-03-11 NOTE — Telephone Encounter (Signed)
Patient was told to call Sydney [name] today about her medications.

## 2016-03-11 NOTE — Telephone Encounter (Signed)
I returned Sydney [name] call, she had hernia repair surgery on June 5th by Dr. Abigail Miyamoto. She was prescribed Oxycodone, she hasn't been  taking her hydrocodone since June 5th, 2017. She picked up the Oxycodone on June 5th from [facility], this has been verified with the pharmacist. Tacey Ruiz was reviewed Oxycodone was not listed at the time of search.  She will continue the oxycodone at this time and f/u with Dr. Abigail Miyamoto.

## 2016-03-13 LAB — TOXASSURE SELECT,+ANTIDEPR,UR: PDF: 0

## 2016-03-18 ENCOUNTER — Other Ambulatory Visit: Payer: Self-pay | Admitting: Physical Medicine & Rehabilitation

## 2016-03-25 NOTE — Progress Notes (Signed)
Urine drug screen for this encounter is consistent for prescribed medication 

## 2016-04-09 ENCOUNTER — Other Ambulatory Visit: Payer: Self-pay | Admitting: Obstetrics and Gynecology

## 2016-04-10 ENCOUNTER — Encounter: Payer: BLUE CROSS/BLUE SHIELD | Admitting: Registered Nurse

## 2016-04-10 ENCOUNTER — Encounter: Payer: BLUE CROSS/BLUE SHIELD | Attending: Physical Medicine & Rehabilitation | Admitting: Registered Nurse

## 2016-04-10 ENCOUNTER — Encounter: Payer: Self-pay | Admitting: Registered Nurse

## 2016-04-10 VITALS — BP 134/93 | HR 90 | Resp 17

## 2016-04-10 DIAGNOSIS — D259 Leiomyoma of uterus, unspecified: Secondary | ICD-10-CM | POA: Diagnosis not present

## 2016-04-10 DIAGNOSIS — G894 Chronic pain syndrome: Secondary | ICD-10-CM

## 2016-04-10 DIAGNOSIS — M549 Dorsalgia, unspecified: Secondary | ICD-10-CM | POA: Insufficient documentation

## 2016-04-10 DIAGNOSIS — H5442 Blindness, left eye, normal vision right eye: Secondary | ICD-10-CM | POA: Diagnosis not present

## 2016-04-10 DIAGNOSIS — M62838 Other muscle spasm: Secondary | ICD-10-CM | POA: Diagnosis not present

## 2016-04-10 DIAGNOSIS — D649 Anemia, unspecified: Secondary | ICD-10-CM | POA: Diagnosis not present

## 2016-04-10 DIAGNOSIS — F17210 Nicotine dependence, cigarettes, uncomplicated: Secondary | ICD-10-CM | POA: Insufficient documentation

## 2016-04-10 DIAGNOSIS — M79606 Pain in leg, unspecified: Secondary | ICD-10-CM | POA: Diagnosis not present

## 2016-04-10 DIAGNOSIS — M545 Low back pain, unspecified: Secondary | ICD-10-CM

## 2016-04-10 DIAGNOSIS — D481 Neoplasm of uncertain behavior of connective and other soft tissue: Secondary | ICD-10-CM | POA: Insufficient documentation

## 2016-04-10 DIAGNOSIS — F419 Anxiety disorder, unspecified: Secondary | ICD-10-CM | POA: Diagnosis not present

## 2016-04-10 DIAGNOSIS — F329 Major depressive disorder, single episode, unspecified: Secondary | ICD-10-CM | POA: Insufficient documentation

## 2016-04-10 DIAGNOSIS — I10 Essential (primary) hypertension: Secondary | ICD-10-CM | POA: Insufficient documentation

## 2016-04-10 DIAGNOSIS — Z5181 Encounter for therapeutic drug level monitoring: Secondary | ICD-10-CM

## 2016-04-10 DIAGNOSIS — M961 Postlaminectomy syndrome, not elsewhere classified: Secondary | ICD-10-CM | POA: Diagnosis present

## 2016-04-10 DIAGNOSIS — Z9221 Personal history of antineoplastic chemotherapy: Secondary | ICD-10-CM | POA: Insufficient documentation

## 2016-04-10 DIAGNOSIS — M5416 Radiculopathy, lumbar region: Secondary | ICD-10-CM | POA: Diagnosis present

## 2016-04-10 DIAGNOSIS — Z79899 Other long term (current) drug therapy: Secondary | ICD-10-CM

## 2016-04-10 DIAGNOSIS — M5136 Other intervertebral disc degeneration, lumbar region: Secondary | ICD-10-CM | POA: Insufficient documentation

## 2016-04-10 MED ORDER — HYDROCODONE-ACETAMINOPHEN 5-325 MG PO TABS: 1.0000 | ORAL_TABLET | Freq: Two times a day (BID) | ORAL | Status: DC | PRN
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
## 2016-04-15 NOTE — Patient Instructions (Addendum)
Your procedure is scheduled on:  Thursday, April 30, 2016  Enter through the Main Entrance of [facility] at:  1:30 PM  Pick up the phone at the desk and dial [phone].  Call this number if you have problems the morning of surgery: [phone].  Remember: Do NOT eat food:  After Midnight Wednesday, April 29, 2016  Do NOT drink clear liquids after:  11:00 AM day of surgery  Take these medicines the morning of surgery with a SIP OF WATER:  Amlodipine, Gabapentin, Baclofen  Do NOT smoke day of  surgery  Do NOT wear jewelry (body piercing), metal hair clips/bobby pins, make-up, or nail polish. Do NOT wear lotions, powders, or perfumes.  You may wear deodorant. Do NOT shave for 48 hours prior to surgery. Do NOT bring valuables to the hospital. Contacts, dentures, or bridgework may not be worn into surgery.  Leave suitcase in car.  After surgery it may be brought to your room.  For patients admitted to the hospital, checkout time is 11:00 AM the day of discharge.

## 2016-04-16 ENCOUNTER — Encounter (HOSPITAL_COMMUNITY)
Admission: RE | Admit: 2016-04-16 | Discharge: 2016-04-16 | Disposition: A | Discharge status: Home / Self Care | Payer: BLUE CROSS/BLUE SHIELD | Source: Ambulatory Visit | Attending: Obstetrics and Gynecology | Admitting: Obstetrics and Gynecology

## 2016-04-16 ENCOUNTER — Encounter (HOSPITAL_COMMUNITY): Payer: Self-pay

## 2016-04-16 ENCOUNTER — Other Ambulatory Visit: Payer: Self-pay

## 2016-04-16 ENCOUNTER — Other Ambulatory Visit: Payer: Self-pay | Admitting: Physical Medicine & Rehabilitation

## 2016-04-16 DIAGNOSIS — Z01810 Encounter for preprocedural cardiovascular examination: Secondary | ICD-10-CM | POA: Diagnosis present

## 2016-04-16 DIAGNOSIS — Z01812 Encounter for preprocedural laboratory examination: Secondary | ICD-10-CM | POA: Diagnosis present

## 2016-04-16 HISTORY — DX: Nontoxic goiter, unspecified: E04.9

## 2016-04-16 LAB — BASIC METABOLIC PANEL
Anion gap: 8 (ref 5–15)
BUN: 10 mg/dL (ref 6–20)
CALCIUM: 8.9 mg/dL (ref 8.9–10.3)
CO2: 21 mmol/L — ABNORMAL LOW (ref 22–32)
CREATININE: 0.79 mg/dL (ref 0.44–1.00)
Chloride: 106 mmol/L (ref 101–111)
GFR calc Af Amer: >60 mL/min (ref >60)
GLUCOSE: 114 mg/dL — AB (ref 65–99)
POTASSIUM: 3.7 mmol/L (ref 3.5–5.1)
SODIUM: 135 mmol/L (ref 135–145)

## 2016-04-16 LAB — CBC
HCT: 34.3 % — ABNORMAL LOW (ref 36.0–46.0)
Hemoglobin: 11 g/dL — ABNORMAL LOW (ref 12.0–15.0)
MCH: 24.9 pg — ABNORMAL LOW (ref 26.0–34.0)
MCHC: 32.1 g/dL (ref 30.0–36.0)
MCV: 77.6 fL — ABNORMAL LOW (ref 78.0–100.0)
PLATELETS: 287 10*3/uL (ref 150–400)
RBC: 4.42 MIL/uL (ref 3.87–5.11)
RDW: 16.8 % — AB (ref 11.5–15.5)
WBC: 6.6 10*3/uL (ref 4.0–10.5)

## 2016-04-16 NOTE — Telephone Encounter (Signed)
Sydney [name] verified by phone with [name] that she is taking gabapentin tid.

## 2016-04-23 ENCOUNTER — Encounter: Payer: Self-pay | Admitting: Podiatry

## 2016-04-23 ENCOUNTER — Ambulatory Visit (INDEPENDENT_AMBULATORY_CARE_PROVIDER_SITE_OTHER): Payer: BLUE CROSS/BLUE SHIELD | Admitting: Podiatry

## 2016-04-23 DIAGNOSIS — Z79899 Other long term (current) drug therapy: Secondary | ICD-10-CM | POA: Diagnosis not present

## 2016-04-23 DIAGNOSIS — M79676 Pain in unspecified toe(s): Secondary | ICD-10-CM | POA: Diagnosis not present

## 2016-04-23 DIAGNOSIS — B351 Tinea unguium: Secondary | ICD-10-CM | POA: Diagnosis not present

## 2016-04-23 MED ORDER — TERBINAFINE HCL 250 MG PO TABS: 250.0000 mg | ORAL_TABLET | Freq: Every day | ORAL | Status: DC

## 2016-04-25 NOTE — Progress Notes (Signed)
She presents today stating her toenails look worse than he did before it is hard for me to believe that I have no fungus in these nails.  Objective: Vital signs are stable she is alert and oriented 3. Her fungal culture demonstrated previously no fungus. However on today's evaluation is obvious that she has subungual debris with malodor brittle nails are thick and painful. 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  Assessment: We are going to assume that this was a false negative. Pain in limb secondary to onychomycosis.  Plan: Debridement of her toenails today we requested liver profile to be performed. I also started her on Lamisil 250 mg tablets 1 by mouth daily I will follow-up with her in 4-6 weeks.

## 2016-04-29 MED ORDER — CEFAZOLIN SODIUM-DEXTROSE 2-4 GM/100ML-% IV SOLN
2.0000 g | INTRAVENOUS | Status: AC
  Administered 2016-04-30: 2 g via INTRAVENOUS

## 2016-04-30 ENCOUNTER — Inpatient Hospital Stay (HOSPITAL_COMMUNITY)
Admission: RE | Admit: 2016-04-30 | Discharge: 2016-05-02 | DRG: 743 | Disposition: A | Discharge status: Home / Self Care | Payer: BLUE CROSS/BLUE SHIELD | Source: Ambulatory Visit | Attending: Obstetrics and Gynecology | Admitting: Obstetrics and Gynecology

## 2016-04-30 ENCOUNTER — Inpatient Hospital Stay (HOSPITAL_COMMUNITY): Payer: BLUE CROSS/BLUE SHIELD | Admitting: Anesthesiology

## 2016-04-30 ENCOUNTER — Encounter (HOSPITAL_COMMUNITY)
Admission: RE | Disposition: A | Discharge status: Home / Self Care | Payer: Self-pay | Source: Ambulatory Visit | Attending: Obstetrics and Gynecology

## 2016-04-30 ENCOUNTER — Encounter (HOSPITAL_COMMUNITY): Payer: Self-pay

## 2016-04-30 DIAGNOSIS — D649 Anemia, unspecified: Secondary | ICD-10-CM | POA: Diagnosis present

## 2016-04-30 DIAGNOSIS — N920 Excessive and frequent menstruation with regular cycle: Secondary | ICD-10-CM | POA: Diagnosis present

## 2016-04-30 DIAGNOSIS — M5416 Radiculopathy, lumbar region: Secondary | ICD-10-CM

## 2016-04-30 DIAGNOSIS — I10 Essential (primary) hypertension: Secondary | ICD-10-CM | POA: Diagnosis present

## 2016-04-30 DIAGNOSIS — F17200 Nicotine dependence, unspecified, uncomplicated: Secondary | ICD-10-CM | POA: Diagnosis present

## 2016-04-30 DIAGNOSIS — Z90721 Acquired absence of ovaries, unilateral: Secondary | ICD-10-CM

## 2016-04-30 DIAGNOSIS — M961 Postlaminectomy syndrome, not elsewhere classified: Secondary | ICD-10-CM | POA: Diagnosis present

## 2016-04-30 DIAGNOSIS — M549 Dorsalgia, unspecified: Secondary | ICD-10-CM | POA: Diagnosis present

## 2016-04-30 DIAGNOSIS — Z90710 Acquired absence of both cervix and uterus: Secondary | ICD-10-CM

## 2016-04-30 DIAGNOSIS — D259 Leiomyoma of uterus, unspecified: Secondary | ICD-10-CM | POA: Diagnosis present

## 2016-04-30 HISTORY — PX: HYSTERECTOMY ABDOMINAL WITH SALPINGECTOMY: SHX6725

## 2016-04-30 LAB — PREGNANCY, URINE: Preg Test, Ur: NEGATIVE

## 2016-04-30 SURGERY — HYSTERECTOMY, TOTAL, ABDOMINAL, WITH SALPINGECTOMY
Anesthesia: General | Site: Abdomen | Laterality: Bilateral

## 2016-04-30 MED ORDER — PANTOPRAZOLE SODIUM 40 MG PO TBEC
40.0000 mg | DELAYED_RELEASE_TABLET | Freq: Every day | ORAL | Status: DC
  Administered 2016-05-01 – 2016-05-02 (×2): 40 mg via ORAL
  Filled 2016-04-30 (×2): qty 1

## 2016-04-30 MED ORDER — SUGAMMADEX SODIUM 200 MG/2ML IV SOLN
INTRAVENOUS | Status: DC | PRN
  Administered 2016-04-30: 159 mg via INTRAVENOUS

## 2016-04-30 MED ORDER — MEPERIDINE HCL 25 MG/ML IJ SOLN: 6.2500 mg | INTRAMUSCULAR | Status: DC | PRN

## 2016-04-30 MED ORDER — SIMETHICONE 80 MG PO CHEW: 80.0000 mg | CHEWABLE_TABLET | Freq: Four times a day (QID) | ORAL | Status: DC | PRN

## 2016-04-30 MED ORDER — NALOXONE HCL 0.4 MG/ML IJ SOLN: 0.4000 mg | INTRAMUSCULAR | Status: DC | PRN

## 2016-04-30 MED ORDER — MORPHINE SULFATE (PF) 10 MG/ML IV SOLN: 5.0000 mg | Freq: Once | INTRAVENOUS | Status: DC

## 2016-04-30 MED ORDER — DEXAMETHASONE SODIUM PHOSPHATE 4 MG/ML IJ SOLN
INTRAMUSCULAR | Status: AC
  Filled 2016-04-30: qty 1

## 2016-04-30 MED ORDER — IBUPROFEN 800 MG PO TABS
800.0000 mg | ORAL_TABLET | Freq: Three times a day (TID) | ORAL | Status: DC | PRN
  Administered 2016-05-01 – 2016-05-02 (×2): 800 mg via ORAL
  Filled 2016-04-30 (×2): qty 1

## 2016-04-30 MED ORDER — DEXAMETHASONE SODIUM PHOSPHATE 10 MG/ML IJ SOLN
INTRAMUSCULAR | Status: DC | PRN
  Administered 2016-04-30: 10 mg via INTRAVENOUS

## 2016-04-30 MED ORDER — EPHEDRINE SULFATE 50 MG/ML IJ SOLN
INTRAMUSCULAR | Status: DC | PRN
  Administered 2016-04-30: 5 mg via INTRAVENOUS
  Administered 2016-04-30: 10 mg via INTRAVENOUS

## 2016-04-30 MED ORDER — HYDROCODONE-ACETAMINOPHEN 5-325 MG PO TABS
1.0000 | ORAL_TABLET | ORAL | Status: DC | PRN
  Administered 2016-05-01 – 2016-05-02 (×5): 1 via ORAL
  Filled 2016-04-30 (×5): qty 1

## 2016-04-30 MED ORDER — ONDANSETRON HCL 4 MG/2ML IJ SOLN
INTRAMUSCULAR | Status: DC | PRN
  Administered 2016-04-30: 4 mg via INTRAVENOUS

## 2016-04-30 MED ORDER — HYDROMORPHONE 1 MG/ML IV SOLN
INTRAVENOUS | Status: DC
  Administered 2016-04-30: 22:00:00 via INTRAVENOUS
  Administered 2016-05-01: 6.2 mL via INTRAVENOUS
  Administered 2016-05-01: 1.6 mg via INTRAVENOUS
  Administered 2016-05-01: 3.89 mg via INTRAVENOUS
  Filled 2016-04-30: qty 25

## 2016-04-30 MED ORDER — HYDROMORPHONE HCL 1 MG/ML IJ SOLN
INTRAMUSCULAR | Status: AC
  Filled 2016-04-30: qty 1

## 2016-04-30 MED ORDER — FENTANYL CITRATE (PF) 250 MCG/5ML IJ SOLN
INTRAMUSCULAR | Status: AC
  Filled 2016-04-30: qty 5

## 2016-04-30 MED ORDER — DIPHENHYDRAMINE HCL 50 MG/ML IJ SOLN: 12.5000 mg | Freq: Four times a day (QID) | INTRAMUSCULAR | Status: DC | PRN

## 2016-04-30 MED ORDER — MIDAZOLAM HCL 2 MG/2ML IJ SOLN
INTRAMUSCULAR | Status: AC
  Filled 2016-04-30: qty 2

## 2016-04-30 MED ORDER — METHOCARBAMOL 1000 MG/10ML IJ SOLN
500.0000 mg | Freq: Once | INTRAVENOUS | Status: AC
  Administered 2016-04-30: 500 mg via INTRAVENOUS
  Filled 2016-04-30: qty 5

## 2016-04-30 MED ORDER — DEXTROSE IN LACTATED RINGERS 5 % IV SOLN
INTRAVENOUS | Status: DC
  Administered 2016-04-30 – 2016-05-01 (×2): via INTRAVENOUS

## 2016-04-30 MED ORDER — ONDANSETRON HCL 4 MG PO TABS: 4.0000 mg | ORAL_TABLET | Freq: Four times a day (QID) | ORAL | Status: DC | PRN

## 2016-04-30 MED ORDER — SCOPOLAMINE 1 MG/3DAYS TD PT72
MEDICATED_PATCH | TRANSDERMAL | Status: AC
  Filled 2016-04-30: qty 1

## 2016-04-30 MED ORDER — MIDAZOLAM HCL 2 MG/2ML IJ SOLN: 0.5000 mg | Freq: Once | INTRAMUSCULAR | Status: DC | PRN

## 2016-04-30 MED ORDER — SCOPOLAMINE 1 MG/3DAYS TD PT72
1.0000 | MEDICATED_PATCH | Freq: Once | TRANSDERMAL | Status: DC
  Administered 2016-04-30: 1.5 mg via TRANSDERMAL

## 2016-04-30 MED ORDER — PROMETHAZINE HCL 25 MG/ML IJ SOLN: 6.2500 mg | INTRAMUSCULAR | Status: DC | PRN

## 2016-04-30 MED ORDER — PROPOFOL 10 MG/ML IV BOLUS
INTRAVENOUS | Status: DC | PRN
  Administered 2016-04-30: 150 mg via INTRAVENOUS

## 2016-04-30 MED ORDER — ONDANSETRON HCL 4 MG/2ML IJ SOLN: 4.0000 mg | Freq: Four times a day (QID) | INTRAMUSCULAR | Status: DC | PRN

## 2016-04-30 MED ORDER — MIDAZOLAM HCL 2 MG/2ML IJ SOLN
INTRAMUSCULAR | Status: DC | PRN
  Administered 2016-04-30: 2 mg via INTRAVENOUS

## 2016-04-30 MED ORDER — TERBINAFINE HCL 250 MG PO TABS
250.0000 mg | ORAL_TABLET | Freq: Every day | ORAL | Status: DC
  Administered 2016-05-01 – 2016-05-02 (×2): 250 mg via ORAL
  Filled 2016-04-30 (×2): qty 1

## 2016-04-30 MED ORDER — LIDOCAINE HCL (CARDIAC) 20 MG/ML IV SOLN
INTRAVENOUS | Status: AC
  Filled 2016-04-30: qty 5

## 2016-04-30 MED ORDER — MORPHINE SULFATE (PF) 10 MG/ML IV SOLN
INTRAVENOUS | Status: AC
  Administered 2016-04-30: 5 mg via INTRAVENOUS
  Filled 2016-04-30: qty 1

## 2016-04-30 MED ORDER — SODIUM CHLORIDE 0.9% FLUSH: 9.0000 mL | INTRAVENOUS | Status: DC | PRN

## 2016-04-30 MED ORDER — FENTANYL CITRATE (PF) 100 MCG/2ML IJ SOLN
INTRAMUSCULAR | Status: DC | PRN
  Administered 2016-04-30 (×5): 50 ug via INTRAVENOUS

## 2016-04-30 MED ORDER — DIPHENHYDRAMINE HCL 12.5 MG/5ML PO ELIX
12.5000 mg | ORAL_SOLUTION | Freq: Four times a day (QID) | ORAL | Status: DC | PRN
  Administered 2016-05-01: 12.5 mg via ORAL
  Filled 2016-04-30: qty 5

## 2016-04-30 MED ORDER — MENTHOL 3 MG MT LOZG: 1.0000 | LOZENGE | OROMUCOSAL | Status: DC | PRN

## 2016-04-30 MED ORDER — ROCURONIUM BROMIDE 100 MG/10ML IV SOLN
INTRAVENOUS | Status: AC
  Filled 2016-04-30: qty 1

## 2016-04-30 MED ORDER — LACTATED RINGERS IV SOLN
INTRAVENOUS | Status: DC
  Administered 2016-04-30: 16:00:00 via INTRAVENOUS
  Administered 2016-04-30: 125 mL/h via INTRAVENOUS

## 2016-04-30 MED ORDER — GABAPENTIN 100 MG PO CAPS
100.0000 mg | ORAL_CAPSULE | Freq: Three times a day (TID) | ORAL | Status: DC
  Administered 2016-05-01 – 2016-05-02 (×5): 100 mg via ORAL
  Filled 2016-04-30 (×5): qty 1

## 2016-04-30 MED ORDER — ROCURONIUM BROMIDE 100 MG/10ML IV SOLN
INTRAVENOUS | Status: DC | PRN
  Administered 2016-04-30: 10 mg via INTRAVENOUS
  Administered 2016-04-30: 50 mg via INTRAVENOUS

## 2016-04-30 MED ORDER — HYDROMORPHONE HCL 1 MG/ML IJ SOLN
0.2500 mg | INTRAMUSCULAR | Status: DC | PRN
  Administered 2016-04-30 (×4): 0.5 mg via INTRAVENOUS

## 2016-04-30 MED ORDER — AMLODIPINE BESYLATE 10 MG PO TABS
10.0000 mg | ORAL_TABLET | Freq: Every day | ORAL | Status: DC
  Administered 2016-05-01 – 2016-05-02 (×2): 10 mg via ORAL
  Filled 2016-04-30 (×2): qty 1

## 2016-04-30 MED ORDER — PROPOFOL 10 MG/ML IV BOLUS
INTRAVENOUS | Status: AC
  Filled 2016-04-30: qty 20

## 2016-04-30 MED ORDER — BUPIVACAINE HCL (PF) 0.25 % IJ SOLN
INTRAMUSCULAR | Status: DC | PRN
  Administered 2016-04-30: 10 mL

## 2016-04-30 MED ORDER — ONDANSETRON HCL 4 MG/2ML IJ SOLN
INTRAMUSCULAR | Status: AC
  Filled 2016-04-30: qty 2

## 2016-04-30 MED ORDER — LIDOCAINE HCL (CARDIAC) 20 MG/ML IV SOLN
INTRAVENOUS | Status: DC | PRN
  Administered 2016-04-30: 80 mg via INTRAVENOUS

## 2016-04-30 SURGICAL SUPPLY — 39 items
BAG URINE DRAINAGE (UROLOGICAL SUPPLIES) ×2 IMPLANT
BENZOIN TINCTURE PRP APPL 2/3 (GAUZE/BANDAGES/DRESSINGS) ×2 IMPLANT
CANISTER SUCT 3000ML (MISCELLANEOUS) ×2 IMPLANT
CATH FOLEY 3WAY  5CC 16FR (CATHETERS) ×1
CATH FOLEY 3WAY 5CC 16FR (CATHETERS) ×1 IMPLANT
CLOTH BEACON ORANGE TIMEOUT ST (SAFETY) ×2 IMPLANT
CONT PATH 16OZ SNAP LID 3702 (MISCELLANEOUS) ×2 IMPLANT
DECANTER SPIKE VIAL GLASS SM (MISCELLANEOUS) ×2 IMPLANT
DRAPE CESAREAN BIRTH W POUCH (DRAPES) ×2 IMPLANT
DRAPE WARM FLUID 44X44 (DRAPE) ×2 IMPLANT
DRSG OPSITE POSTOP 4X10 (GAUZE/BANDAGES/DRESSINGS) ×2 IMPLANT
DURAPREP 26ML APPLICATOR (WOUND CARE) ×2 IMPLANT
GAUZE SPONGE 4X4 16PLY XRAY LF (GAUZE/BANDAGES/DRESSINGS) ×2 IMPLANT
GLOVE BIOGEL PI IND STRL 7.0 (GLOVE) ×4 IMPLANT
GLOVE BIOGEL PI INDICATOR 7.0 (GLOVE) ×4
GLOVE ECLIPSE 6.5 STRL STRAW (GLOVE) ×2 IMPLANT
GOWN STRL REUS W/TWL LRG LVL3 (GOWN DISPOSABLE) ×6 IMPLANT
NEEDLE HYPO 22GX1.5 SAFETY (NEEDLE) ×2 IMPLANT
NS IRRIG 1000ML POUR BTL (IV SOLUTION) ×2 IMPLANT
PACK ABDOMINAL GYN (CUSTOM PROCEDURE TRAY) ×2 IMPLANT
PAD OB MATERNITY 4.3X12.25 (PERSONAL CARE ITEMS) ×2 IMPLANT
PROTECTOR NERVE ULNAR (MISCELLANEOUS) ×4 IMPLANT
RETRACTOR WND ALEXIS 25 LRG (MISCELLANEOUS) ×1 IMPLANT
RTRCTR WOUND ALEXIS 25CM LRG (MISCELLANEOUS) ×2
SPONGE LAP 18X18 X RAY DECT (DISPOSABLE) ×2 IMPLANT
STAPLER VISISTAT 35W (STAPLE) ×2 IMPLANT
STRIP CLOSURE SKIN 1/2X4 (GAUZE/BANDAGES/DRESSINGS) ×2 IMPLANT
SUT PDS AB 0 CTX 60 (SUTURE) ×6 IMPLANT
SUT PLAIN 2 0 XLH (SUTURE) ×2 IMPLANT
SUT PROLENE 0 CT 1 30 (SUTURE) IMPLANT
SUT VIC AB 0 CT1 18XCR BRD8 (SUTURE) ×3 IMPLANT
SUT VIC AB 0 CT1 36 (SUTURE) ×8 IMPLANT
SUT VIC AB 0 CT1 8-18 (SUTURE) ×3
SUT VIC AB 3-0 SH 27 (SUTURE) ×2
SUT VIC AB 3-0 SH 27X BRD (SUTURE) ×2 IMPLANT
SUT VICRYL 0 TIES 12 18 (SUTURE) ×2 IMPLANT
SYR CONTROL 10ML LL (SYRINGE) ×2 IMPLANT
TOWEL OR 17X24 6PK STRL BLUE (TOWEL DISPOSABLE) ×4 IMPLANT
WATER STERILE IRR 1000ML POUR (IV SOLUTION) ×2 IMPLANT

## 2016-04-30 NOTE — Anesthesia Preprocedure Evaluation (Signed)
Anesthesia Evaluation  Patient identified by MRN, date of birth, ID band Patient awake    Reviewed: Allergy & Precautions, NPO status , Patient's Chart, lab work & pertinent test results  History of Anesthesia Complications Negative for: history of anesthetic complications  Airway Mallampati: I  TM Distance: >3 FB Neck ROM: Full    Dental  (+) Dental Advisory Given   Pulmonary Current Smoker,    breath sounds clear to auscultation       Cardiovascular hypertension, Pt. on medications  Rhythm:Regular Rate:Normal     Neuro/Psych Anxiety Depression negative neurological ROS     GI/Hepatic negative GI ROS, Neg liver ROS, H/o stromal tumor of stomach:  Surgery, chemo   Endo/Other  negative endocrine ROS  Renal/GU negative Renal ROS     Musculoskeletal   Abdominal   Peds  Hematology negative hematology ROS (+)   Anesthesia Other Findings   Reproductive/Obstetrics                            Anesthesia Physical Anesthesia Plan  ASA: II  Anesthesia Plan: General   Post-op Pain Management:    Induction: Intravenous  Airway Management Planned: Oral ETT  Additional Equipment:   Intra-op Plan:   Post-operative Plan: Extubation in OR  Informed Consent: I have reviewed the patients History and Physical, chart, labs and discussed the procedure including the risks, benefits and alternatives for the proposed anesthesia with the patient or authorized representative who has indicated his/her understanding and acceptance.   Dental advisory given  Plan Discussed with: CRNA and Surgeon  Anesthesia Plan Comments: (Plan routine monitors, GETA)        Anesthesia Quick Evaluation

## 2016-04-30 NOTE — Transfer of Care (Signed)
Immediate Anesthesia Transfer of Care Note  Patient: Sydney [name]  Procedure(s) Performed: Procedure(s) with comments: Exploratory Laparotomy HYSTERECTOMY ABDOMINAL WITH SALPINGECTOMY/Possible Bilateral Salpingo-Oophorectomy (Bilateral) - 90 min.  Patient Location: PACU  Anesthesia Type:General  Level of Consciousness: awake and sedated  Airway & Oxygen Therapy: Patient Spontanous Breathing and Patient connected to nasal cannula oxygen  Post-op Assessment: Report given to RN  Post vital signs: Reviewed and stable  Last Vitals:  Vitals:   04/30/16 1339  BP: (!) 126/104  Pulse: 76  Resp: 16  Temp: 36.9 C    Last Pain:  Vitals:   04/30/16 1339  TempSrc: Oral      Patients Stated Pain Goal: 2 (92/34/14 4360)  Complications: No apparent anesthesia complications

## 2016-04-30 NOTE — Brief Op Note (Signed)
04/30/2016  7:05 PM  PATIENT:  Sydney [name]  45 y.o. female  PRE-OPERATIVE DIAGNOSIS:  Symptomatic Uterine Fibroid, History of GIST  POST-OPERATIVE DIAGNOSIS:  Symptomatic Uterine Fibroid, Historyof GIST  PROCEDURE:  Exp laparotomy, total abdominal Hysterectomy, BSO  SURGEON:  Surgeon(s) and Role:    * Servando Salina, MD - Primary  PHYSICIAN ASSISTANT:   ASSISTANTS: Princess Bruins, MD   ANESTHESIA:   general   Findings: 14-15 week size fibroid uterus, left ovary with small nodule, nl left tube, right tube surgically interrupted,( fimbriated ends x two noted)  nl ureters bilaterally  EBL:  No intake/output data recorded.  BLOOD ADMINISTERED:none  DRAINS: none   LOCAL MEDICATIONS USED:  MARCAINE     SPECIMEN:  Source of Specimen:  uterus with cervix, tubes and ovaries  DISPOSITION OF SPECIMEN:  PATHOLOGY  COUNTS:  YES  TOURNIQUET:  * No tourniquets in log *  DICTATION: .Other Dictation: Dictation Number [id number]  PLAN OF CARE: Admit to inpatient   PATIENT DISPOSITION:  PACU - hemodynamically stable.   Delay start of Pharmacological VTE agent (>24hrs) due to surgical blood loss or risk of bleeding: yes

## 2016-04-30 NOTE — Anesthesia Postprocedure Evaluation (Signed)
Anesthesia Post Note  Patient: WINRY EGNEW  Procedure(s) Performed: Procedure(s) (LRB): Exploratory Laparotomy HYSTERECTOMY ABDOMINAL WITH SALPINGECTOMY/Possible Bilateral Salpingo-Oophorectomy (Bilateral)  Patient location during evaluation: PACU Anesthesia Type: General Level of consciousness: awake and alert Pain management: pain level controlled Vital Signs Assessment: post-procedure vital signs reviewed and stable Respiratory status: spontaneous breathing, nonlabored ventilation, respiratory function stable and patient connected to nasal cannula oxygen Cardiovascular status: blood pressure returned to baseline and stable Postop Assessment: no signs of nausea or vomiting Anesthetic complications: no     Last Vitals:  Vitals:   04/30/16 1900 04/30/16 1915  BP: 123/87 127/89  Pulse: 64 71  Resp: 16 18  Temp:      Last Pain:  Vitals:   04/30/16 1915  TempSrc:   PainSc: 10-Worst pain ever   Pain Goal: Patients Stated Pain Goal: 2 (04/30/16 1915)               Nathalia Wismer A.

## 2016-04-30 NOTE — Anesthesia Procedure Notes (Signed)
Procedure Name: Intubation Date/Time: 04/30/2016 3:51 PM Performed by: Hewitt Blade Pre-anesthesia Checklist: Patient identified, Emergency Drugs available, Suction available and Patient being monitored Patient Re-evaluated:Patient Re-evaluated prior to inductionOxygen Delivery Method: Circle system utilized Preoxygenation: Pre-oxygenation with 100% oxygen Intubation Type: IV induction Ventilation: Mask ventilation without difficulty Laryngoscope Size: Mac and 3 Grade View: Grade III Tube type: Oral Tube size: 7.0 mm Number of attempts: 1 Airway Equipment and Method: Stylet Placement Confirmation: ETT inserted through vocal cords under direct vision,  positive ETCO2 and breath sounds checked- equal and bilateral Secured at: 22 cm Tube secured with: Tape Dental Injury: Teeth and Oropharynx as per pre-operative assessment

## 2016-05-01 ENCOUNTER — Encounter (HOSPITAL_COMMUNITY): Payer: Self-pay

## 2016-05-01 LAB — CBC
HCT: 32.8 % — ABNORMAL LOW (ref 36.0–46.0)
HEMOGLOBIN: 10.6 g/dL — AB (ref 12.0–15.0)
MCH: 25 pg — AB (ref 26.0–34.0)
MCHC: 32.3 g/dL (ref 30.0–36.0)
MCV: 77.4 fL — ABNORMAL LOW (ref 78.0–100.0)
PLATELETS: 322 10*3/uL (ref 150–400)
RBC: 4.24 MIL/uL (ref 3.87–5.11)
RDW: 16.9 % — ABNORMAL HIGH (ref 11.5–15.5)
WBC: 10.8 10*3/uL — AB (ref 4.0–10.5)

## 2016-05-01 LAB — BASIC METABOLIC PANEL
ANION GAP: 6 (ref 5–15)
BUN: 5 mg/dL — ABNORMAL LOW (ref 6–20)
CHLORIDE: 101 mmol/L (ref 101–111)
CO2: 25 mmol/L (ref 22–32)
Calcium: 8.5 mg/dL — ABNORMAL LOW (ref 8.9–10.3)
Creatinine, Ser: 0.58 mg/dL (ref 0.44–1.00)
GFR calc Af Amer: >60 mL/min (ref >60)
Glucose, Bld: 132 mg/dL — ABNORMAL HIGH (ref 65–99)
POTASSIUM: 3.7 mmol/L (ref 3.5–5.1)
SODIUM: 132 mmol/L — AB (ref 135–145)

## 2016-05-01 NOTE — Op Note (Signed)
NAMEMARLIA, Sydney [name]            ACCOUNT NO.:  [id number]  MEDICAL RECORD NO.:  [id number]  LOCATION:                                 FACILITY:  PHYSICIAN:  Servando Salina, M.D.DATE OF BIRTH:  06-27-1971  DATE OF PROCEDURE:  04/30/2016 DATE OF DISCHARGE:                              OPERATIVE REPORT   PREOPERATIVE DIAGNOSIS:  Symptomatic uterine fibroids, history of GIST.  PROCEDURES:  Exploratory laparotomy, total abdominal hysterectomy, bilateral salpingo-oophorectomy.  POSTOPERATIVE DIAGNOSIS:  Symptomatic uterine fibroids, history of GIST.  ANESTHESIA:  General.  SURGEON:  Servando Salina, M.D.  ASSISTANT:  Princess Bruins, M.D.  DESCRIPTION OF PROCEDURE:  Under adequate general anesthesia, the patient was placed in the supine position.  She was sterilely prepped and draped in the usual fashion.  A 3 way Foley catheter was sterilely placed. 0.25% Marcaine was injected in the planned vertical skin incision site.  Skin incision was then made from the sub-umbilicus down to the suprapubic area.  That incision was carried down to the rectus fascia, which was opened vertically.  The rectus muscle split in the midline.  The parietal peritoneum was opened sharply and carefully extended superiorly and inferiorly.  It was then noted that there was a large fibroid uterus irregular in contour of at least 14-15 week size. The left tube and ovary was seen.  There was a small nodule on the left ovary.  The right ovary was seen.  There was evidence of a disrupted right fallopian tube with fimbriated end noted proximally and a separate area noted inferiorly.  There were adhesions in that left side of the uterus to the adnexa.  A self retaining Alexis retractor was then placed.  The left round ligament was grasped, 0 Vicryl figure-of-eight suture x2 was placed and the intervening segment was opened with cautery.  The incision was then carried to the anterior vesicouterine  peritoneum, which was then opened transversely and the bladder bluntly and sharply dissected off the lower uterine segment and displaced inferiorly.  A pedunculated fibroid was noted.  The uterus was exteriorized.  It was then noted there was bleeding on the right lower quadrant area with bleeding vessel apparently occurred from the use of the Alexis self retaining retractor.  At that point, the retractor was removed and the uterus replaced back in the abdomen.  The bleeding vessel was suture ligated x3 with hemostasis achieved.  The right round ligament was initially not identifiable due to the distortion of the anatomy on the right.  Procedure was continued on the left with the uterus then re-exteriorized.  The left retroperitoneal space was then opened.  The ureter was identified.  Above the ureter, a window was made placed in the peritoneum and approximately the uterine vessels which were quite large were isolated, tied with 0 Vicryl x2 proximally and 1 distally and intervening uterine vessels were then cut.  The peritoneum was then carried around to the posteriorly.  Uterine vessels were then skeletonized.  The vessels were doubly clamped, cut, and suture ligated x2 on the opposite side.  The right retroperitoneal space was then opened.  The uterine vessel was isolated.  The ureter was identified and the uterine vessels were  quite proximally tied with 0 Vicryl x2 proximally and distally with the intervening vessels being cut.  That allowed for better visualization of the adhesions on the right with subsequently identifying the round ligament which was then clamped and suture ligated with 0 Vicryl x2 and cut in the cautery.  The uterine vessels were then seen.  The portion of the fallopian tubes on both sides had been secured in by removal of the ovary and remaining tubes. The uterine vessels were then skeletonized, clamped, doubly clamped, cut, and suture ligated with 0 Vicryl x2.   The bladder was continued to be dissected inferiorly.  The cardinal ligaments were then bilaterally clamped, cut, and suture ligated with 0 Vicryl until the uterosacral ligaments was reached at which time, these were bilaterally clamped, cut, and suture ligated.  The cervix was then severed from its vaginal attachment with the clamp bilaterally across the vaginal cuff.  Once this was done, the vaginal cuff was circumferentially removed from its cervical attachment.  The vaginal cuff was reefed with 0 Vicryl running lock stitch circumferentially and then the vaginal cuff was then closed in the midline with interrupted 0 Vicryl figure-of-eight sutures.  The pelvis was then irrigated, suctioned, bleedings were looked for and none were seen.  The upper abdomen was explored.  No palpable lesions were noted.  The pelvis had good hemostasis.  At that point, the rectus fascia was then closed with a running double loop of 0 PDS x2.  The subcutaneous area was then closed with interrupted 2-0 plain sutures and the skin approximated with staples.  SPECIMEN:  Uterus, cervix, tubes and ovaries sent to Pathology.  ESTIMATED BLOOD LOSS:  300 ml.  INTRAOPERATIVE FLUID:  2500 ml.  URINE OUTPUT:  750 ml.  COUNTS:  Sponge and instrument counts x2 was correct.  COMPLICATION:  None.  The patient tolerated the procedure well and was transferred to recovery in stable condition.     Servando Salina, M.D.   ______________________________ Servando Salina, M.D.    [place]/MEDQ  D:  05/01/2016  T:  05/01/2016  Job:  [id number]

## 2016-05-01 NOTE — Op Note (Deleted)
  The note originally documented on this encounter has been moved the the encounter in which it belongs.  

## 2016-05-01 NOTE — Progress Notes (Signed)
Subjective: Patient reports tolerating PO and no problems voiding.   Reviewed intraop findings. Asking if she can go home  Objective: I have reviewed patient's vital signs.  vital signs, intake and output and labs. Vitals:   05/01/16 0553 05/01/16 0559  BP: 136/90   Pulse: (!) 53   Resp: 16 14  Temp: 98.1 F (36.7 C)    I/O last 3 completed shifts: In: 3971.3 [P.O.:240; I.V.:3731.3] Out: 1102 [Urine:3450; Blood:400] Total I/O In: -  Out: 350 [Urine:350]  Lab Results  Component Value Date   WBC 10.8 (H) 05/01/2016   HGB 10.6 (L) 05/01/2016   HCT 32.8 (L) 05/01/2016   MCV 77.4 (L) 05/01/2016   PLT 322 05/01/2016   Lab Results  Component Value Date   CREATININE 0.58 05/01/2016    EXAM General: alert, cooperative and no distress Resp: clear to auscultation bilaterally Cardio: regular rate and rhythm, S1, S2 normal, no murmur, click, rub or gallop GI: soft nondistended active BS. primary dressing c/d/i Extremities: no edema, redness or tenderness in the calves or thighs Vaginal Bleeding: none  Assessment: s/p Procedure(s): Exploratory Laparotomy HYSTERECTOMY ABDOMINAL WITH  Bilateral Salpingo-Oophorectomy: stable and tolerating diet  Plan: Advance diet Encourage ambulation Advance to PO medication Discontinue IV fluids  LOS: 1 day    Eilish Mcdaniel A, MD 05/01/2016 8:31 AM    05/01/2016, 8:31 AM

## 2016-05-01 NOTE — Anesthesia Postprocedure Evaluation (Signed)
Anesthesia Post Note  Patient: Sydney [name]  Procedure(s) Performed: Procedure(s) (LRB): Exploratory Laparotomy HYSTERECTOMY ABDOMINAL WITH SALPINGECTOMY/Possible Bilateral Salpingo-Oophorectomy (Bilateral)  Patient location during evaluation: [facility] Anesthesia Type: General Level of consciousness: awake and alert and oriented Pain management: pain level controlled Vital Signs Assessment: post-procedure vital signs reviewed and stable Respiratory status: spontaneous breathing, nonlabored ventilation and respiratory function stable Cardiovascular status: stable Postop Assessment: no signs of nausea or vomiting and adequate PO intake Anesthetic complications: no     Last Vitals:  Vitals:   05/01/16 0553 05/01/16 0559  BP: 136/90   Pulse: (!) 53   Resp: 16 14  Temp: 36.7 C     Last Pain:  Vitals:   05/01/16 0608  TempSrc:   PainSc: 6    Pain Goal: Patients Stated Pain Goal: 3 (05/01/16 6720)               Willa Rough

## 2016-05-01 NOTE — Plan of Care (Signed)
Problem: Safety: Goal: Ability to remain free from injury will improve Outcome: Completed/Met Date Met: 05/01/16 Ambulates well without assistance.  Problem: Pain Managment: Goal: General experience of comfort will improve Outcome: Completed/Met Date Met: 05/01/16 Good pain control on po Vicodin.  Problem: Physical Regulation: Goal: Ability to maintain clinical measurements within normal limits will improve Outcome: Completed/Met Date Met: 05/01/16 VSS at this time.  Problem: Skin Integrity: Goal: Risk for impaired skin integrity will decrease Outcome: Completed/Met Date Met: 05/01/16 Ambulates and moves frequently while in bed.  Problem: Tissue Perfusion: Goal: Risk factors for ineffective tissue perfusion will decrease Outcome: Completed/Met Date Met: 05/01/16 VSS,SCDs in place.  Problem: Activity: Goal: Risk for activity intolerance will decrease Outcome: Completed/Met Date Met: 05/01/16 Tolerates walking well.  Problem: Fluid Volume: Goal: Ability to maintain a balanced intake and output will improve Outcome: Completed/Met Date Met: 05/01/16 Tolerates a Regular diet well.  Problem: Urinary Elimination: Goal: Ability to reestablish a normal urinary elimination pattern will improve Outcome: Completed/Met Date Met: 05/01/16 Voids qs urine since Foley removed.

## 2016-05-02 MED ORDER — BISACODYL 10 MG RE SUPP
10.0000 mg | Freq: Once | RECTAL | Status: AC
  Administered 2016-05-02: 10 mg via RECTAL
  Filled 2016-05-02: qty 1

## 2016-05-02 MED ORDER — IBUPROFEN 800 MG PO TABS: 800.0000 mg | ORAL_TABLET | Freq: Three times a day (TID) | ORAL | 0 refills | Status: DC | PRN

## 2016-05-02 MED ORDER — HYDROCODONE-ACETAMINOPHEN 5-325 MG PO TABS: 1.0000 | ORAL_TABLET | ORAL | 0 refills | Status: DC | PRN

## 2016-05-02 NOTE — Discharge Instructions (Signed)
Call if temperature greater than equal to 100.4, nothing per vagina for 4-6 weeks or severe nausea vomiting, increased incisional pain , drainage or redness in the incision site, no straining with bowel movements, showers no bath °

## 2016-05-02 NOTE — Discharge Summary (Signed)
Physician Discharge Summary  Patient ID: Sydney [name] MRN: [id number] DOB/AGE: 05-27-1971 45 y.o.  Admit date: 04/30/2016 Discharge date: 05/02/2016  Admission Diagnoses: symptomatic uterine fibroids, hx GIST  Discharge Diagnoses: symptomatic uterine fibroids, hx GIST Active Problems:   S/P hysterectomy with oophorectomy post lumbar laminectomy pain syndrome Chronic HTN  Discharged Condition: stable  Hospital Course: pt was admitted to [facility] where she underwent TAHBSO<LOA. Uncomplicated  Postoperative course  Consults: None  Significant Diagnostic Studies: labs:  CBC Latest Ref Rng & Units 05/01/2016 04/16/2016 12/20/2014  WBC 4.0 - 10.5 K/uL 10.8(H) 6.6 5.4  Hemoglobin 12.0 - 15.0 g/dL 10.6(L) 11.0(L) 11.9(L)  Hematocrit 36.0 - 46.0 % 32.8(L) 34.3(L) 37.9  Platelets 150 - 400 K/uL 322 287 331   Lab Results  Component Value Date   CREATININE 0.58 05/01/2016   CREATININE 0.79 04/16/2016   CREATININE 0.77 12/20/2014    Treatments: surgery: TAHBSO LOA  Discharge Exam: Blood pressure (!) 146/93, pulse 62, temperature 98.8 F (37.1 C), temperature source Oral, resp. rate 16, height 5' 9"  (1.753 m), weight 79.4 kg (175 lb), last menstrual period 04/02/2016, SpO2 100 %. General appearance: alert, cooperative and no distress Back: no tenderness to percussion or palpation Resp: clear to auscultation bilaterally Cardio: regular rate and rhythm, S1, S2 normal, no murmur, click, rub or gallop GI: soft, non-tender; bowel sounds normal; no masses,  no organomegaly Pelvic: deferred Extremities: no edema, redness or tenderness in the calves or thighs Skin: Skin color, texture, turgor normal. No rashes or lesions Incision/Wound:  Vertical: dressing d/c/i  Disposition: 01-Home or Self Care  Discharge Instructions    Discharge patient    Complete by:  As directed       Medication List    TAKE these medications   amLODipine 10 MG tablet Commonly known as:  NORVASC Take 1  tablet (10 mg total) by mouth daily.   baclofen 10 MG tablet Commonly known as:  LIORESAL Take 1 tablet (10 mg total) by mouth every 8 (eight) hours as needed for muscle spasms.   gabapentin 100 MG capsule Commonly known as:  NEURONTIN TAKE 1 CAPSULE(100 MG) BY MOUTH THREE TIMES DAILY   HYDROcodone-acetaminophen 5-325 MG tablet Commonly known as:  NORCO/VICODIN Take 1 tablet by mouth 2 (two) times daily as needed for moderate pain. What changed:  Another medication with the same name was added. Make sure you understand how and when to take each.   HYDROcodone-acetaminophen 5-325 MG tablet Commonly known as:  NORCO/VICODIN Take 1 tablet by mouth every 4 (four) hours as needed for moderate pain. What changed:  You were already taking a medication with the same name, and this prescription was added. Make sure you understand how and when to take each.   ibuprofen 800 MG tablet Commonly known as:  ADVIL,MOTRIN Take 1 tablet (800 mg total) by mouth every 8 (eight) hours as needed (mild pain).   terbinafine 250 MG tablet Commonly known as:  LAMISIL Take 1 tablet (250 mg total) by mouth daily.      Follow-up Information    Daishia Fetterly A, MD Follow up on 05/07/2016.   Specialty:  Obstetrics and Gynecology Why:  staple removal with nurse on Thursday, postop visit with Dr Garwin Brothers 4 weeks Contact information: [address] [place] [address] [phone]           Signed: Kazi Reppond A 05/02/2016, 11:47 AM

## 2016-05-02 NOTE — Progress Notes (Signed)
Subjective: Patient reports tolerating PO, + flatus and no problems voiding.    Objective: I have reviewed patient's vital signs.  vital signs and intake and output. Vitals:   05/01/16 2237 05/02/16 0644  BP: 125/85 (!) 146/93  Pulse: 64 62  Resp: 15 16  Temp: 99.3 F (37.4 C) 98.8 F (37.1 C)   I/O last 3 completed shifts: In: 1471.3 [P.O.:240; I.V.:1231.3] Out: 3050 [Urine:3050] No intake/output data recorded.  Lab Results  Component Value Date   WBC 10.8 (H) 05/01/2016   HGB 10.6 (L) 05/01/2016   HCT 32.8 (L) 05/01/2016   MCV 77.4 (L) 05/01/2016   PLT 322 05/01/2016   Lab Results  Component Value Date   CREATININE 0.58 05/01/2016    EXAM General: alert, cooperative and no distress Resp: clear to auscultation bilaterally Cardio: regular rate and rhythm, S1, S2 normal, no murmur, click, rub or gallop GI: soft, non-tender; bowel sounds normal; no masses,  no organomegaly and incision: clean, dry and intact Extremities: no edema, redness or tenderness in the calves or thighs Vaginal Bleeding: none  Assessment: s/p Procedure(s): Exploratory Laparotomy HYSTERECTOMY ABDOMINAL WITH  Bilateral Salpingo-Oophorectomy: stable, progressing well, tolerating diet and anemia  Plan: Encourage ambulation Discontinue IV fluids Discharge home  D/c instructions reviewed. Staple removal 8/3 Postop appt 4 weeks  LOS: 2 days    Rosann Gorum A, MD 05/02/2016 11:44 AM    05/02/2016, 11:44 AM

## 2016-05-04 ENCOUNTER — Encounter (HOSPITAL_COMMUNITY): Payer: Self-pay | Admitting: Obstetrics and Gynecology

## 2016-05-14 ENCOUNTER — Ambulatory Visit (INDEPENDENT_AMBULATORY_CARE_PROVIDER_SITE_OTHER): Payer: BLUE CROSS/BLUE SHIELD | Admitting: Medical

## 2016-05-14 ENCOUNTER — Encounter: Payer: Self-pay | Admitting: Medical

## 2016-05-14 VITALS — BP 140/90 | HR 75 | Resp 18 | Wt 173.0 lb

## 2016-05-14 DIAGNOSIS — L989 Disorder of the skin and subcutaneous tissue, unspecified: Secondary | ICD-10-CM | POA: Diagnosis not present

## 2016-05-14 DIAGNOSIS — E049 Nontoxic goiter, unspecified: Secondary | ICD-10-CM

## 2016-05-14 DIAGNOSIS — E041 Nontoxic single thyroid nodule: Secondary | ICD-10-CM

## 2016-05-14 DIAGNOSIS — K5900 Constipation, unspecified: Secondary | ICD-10-CM | POA: Insufficient documentation

## 2016-05-14 DIAGNOSIS — B079 Viral wart, unspecified: Secondary | ICD-10-CM | POA: Diagnosis not present

## 2016-05-14 DIAGNOSIS — R5382 Chronic fatigue, unspecified: Secondary | ICD-10-CM

## 2016-05-14 DIAGNOSIS — D649 Anemia, unspecified: Secondary | ICD-10-CM

## 2016-05-14 LAB — TSH: TSH: 1.35 mIU/L

## 2016-05-14 LAB — FERRITIN: Ferritin: 12 ng/mL (ref 10–232)

## 2016-05-14 LAB — T4, FREE: Free T4: 1.2 ng/dL (ref 0.8–1.8)

## 2016-05-14 LAB — VITAMIN B12: Vitamin B-12: 476 pg/mL (ref 200–1100)

## 2016-05-14 LAB — FOLATE: FOLATE: 7.4 ng/mL (ref >5.4)

## 2016-05-14 MED ORDER — LINACLOTIDE 145 MCG PO CAPS: 145.0000 ug | ORAL_CAPSULE | Freq: Every day | ORAL | 0 refills | Status: DC
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
## 2016-05-15 ENCOUNTER — Encounter: Payer: BLUE CROSS/BLUE SHIELD | Attending: Physical Medicine & Rehabilitation | Admitting: Registered Nurse

## 2016-05-15 ENCOUNTER — Other Ambulatory Visit: Payer: Self-pay | Admitting: Medical

## 2016-05-15 ENCOUNTER — Encounter: Payer: Self-pay | Admitting: Registered Nurse

## 2016-05-15 VITALS — BP 141/97 | HR 91 | Resp 16

## 2016-05-15 DIAGNOSIS — M549 Dorsalgia, unspecified: Secondary | ICD-10-CM | POA: Diagnosis not present

## 2016-05-15 DIAGNOSIS — D259 Leiomyoma of uterus, unspecified: Secondary | ICD-10-CM | POA: Insufficient documentation

## 2016-05-15 DIAGNOSIS — D481 Neoplasm of uncertain behavior of connective and other soft tissue: Secondary | ICD-10-CM | POA: Insufficient documentation

## 2016-05-15 DIAGNOSIS — H5442 Blindness, left eye, normal vision right eye: Secondary | ICD-10-CM | POA: Insufficient documentation

## 2016-05-15 DIAGNOSIS — F419 Anxiety disorder, unspecified: Secondary | ICD-10-CM | POA: Diagnosis not present

## 2016-05-15 DIAGNOSIS — G894 Chronic pain syndrome: Secondary | ICD-10-CM

## 2016-05-15 DIAGNOSIS — Z9221 Personal history of antineoplastic chemotherapy: Secondary | ICD-10-CM | POA: Insufficient documentation

## 2016-05-15 DIAGNOSIS — F329 Major depressive disorder, single episode, unspecified: Secondary | ICD-10-CM | POA: Insufficient documentation

## 2016-05-15 DIAGNOSIS — M62838 Other muscle spasm: Secondary | ICD-10-CM

## 2016-05-15 DIAGNOSIS — M961 Postlaminectomy syndrome, not elsewhere classified: Secondary | ICD-10-CM | POA: Diagnosis not present

## 2016-05-15 DIAGNOSIS — M5416 Radiculopathy, lumbar region: Secondary | ICD-10-CM | POA: Insufficient documentation

## 2016-05-15 DIAGNOSIS — M5136 Other intervertebral disc degeneration, lumbar region: Secondary | ICD-10-CM | POA: Diagnosis not present

## 2016-05-15 DIAGNOSIS — M545 Low back pain, unspecified: Secondary | ICD-10-CM

## 2016-05-15 DIAGNOSIS — D649 Anemia, unspecified: Secondary | ICD-10-CM | POA: Diagnosis not present

## 2016-05-15 DIAGNOSIS — I10 Essential (primary) hypertension: Secondary | ICD-10-CM | POA: Insufficient documentation

## 2016-05-15 DIAGNOSIS — M79606 Pain in leg, unspecified: Secondary | ICD-10-CM | POA: Insufficient documentation

## 2016-05-15 DIAGNOSIS — Z79899 Other long term (current) drug therapy: Secondary | ICD-10-CM

## 2016-05-15 DIAGNOSIS — Z5181 Encounter for therapeutic drug level monitoring: Secondary | ICD-10-CM

## 2016-05-15 DIAGNOSIS — F17210 Nicotine dependence, cigarettes, uncomplicated: Secondary | ICD-10-CM | POA: Diagnosis not present

## 2016-05-15 LAB — IRON AND TIBC
%SAT: 3 % — ABNORMAL LOW (ref 11–50)
Iron: 11 ug/dL — ABNORMAL LOW (ref 40–190)
TIBC: 347 ug/dL (ref 250–450)
UIBC: 336 ug/dL (ref 125–400)

## 2016-05-15 LAB — T3: T3, Total: 127 ng/dL (ref 76–181)

## 2016-05-15 MED ORDER — FERROUS GLUCONATE 324 (38 FE) MG PO TABS: 324.0000 mg | ORAL_TABLET | Freq: Two times a day (BID) | ORAL | 3 refills | Status: DC

## 2016-05-15 MED ORDER — HYDROCODONE-ACETAMINOPHEN 5-325 MG PO TABS: 1.0000 | ORAL_TABLET | Freq: Two times a day (BID) | ORAL | 0 refills | Status: DC | PRN

## 2016-05-15 MED ORDER — AMLODIPINE BESYLATE 10 MG PO TABS: 10.0000 mg | ORAL_TABLET | Freq: Every day | ORAL | 1 refills | Status: DC
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
## 2016-05-19 ENCOUNTER — Ambulatory Visit
Admission: RE | Admit: 2016-05-19 | Discharge: 2016-05-19 | Disposition: A | Discharge status: Home / Self Care | Payer: BLUE CROSS/BLUE SHIELD | Source: Ambulatory Visit | Attending: Medical | Admitting: Medical

## 2016-05-19 DIAGNOSIS — E049 Nontoxic goiter, unspecified: Secondary | ICD-10-CM

## 2016-05-19 DIAGNOSIS — E041 Nontoxic single thyroid nodule: Secondary | ICD-10-CM

## 2016-05-20 ENCOUNTER — Ambulatory Visit: Payer: BLUE CROSS/BLUE SHIELD | Admitting: Physical Medicine & Rehabilitation

## 2016-05-20 ENCOUNTER — Other Ambulatory Visit: Payer: Self-pay

## 2016-05-20 ENCOUNTER — Telehealth: Payer: Self-pay

## 2016-05-20 DIAGNOSIS — E041 Nontoxic single thyroid nodule: Secondary | ICD-10-CM

## 2016-05-20 NOTE — Telephone Encounter (Signed)
Order for thyroid biopsy done, and appt is 05/28/16 arriving at 2:10 [address] location. Will take 45 minutes to an hour to complete

## 2016-05-20 NOTE — Telephone Encounter (Signed)
-----   Message from Carlena Hurl, PA-C sent at 05/20/2016  8:14 AM EDT ----- There are multiple nodules and some meet criteria for biopsy.  Please refer to [facility] for thyroid nodule biopsy

## 2016-05-28 ENCOUNTER — Other Ambulatory Visit (HOSPITAL_COMMUNITY)
Admission: RE | Admit: 2016-05-28 | Discharge: 2016-05-28 | Disposition: A | Discharge status: Home / Self Care | Payer: BLUE CROSS/BLUE SHIELD | Source: Ambulatory Visit | Attending: General Surgery | Admitting: General Surgery

## 2016-05-28 ENCOUNTER — Ambulatory Visit
Admission: RE | Admit: 2016-05-28 | Discharge: 2016-05-28 | Disposition: A | Discharge status: Home / Self Care | Payer: BLUE CROSS/BLUE SHIELD | Source: Ambulatory Visit | Attending: Medical | Admitting: Medical

## 2016-05-28 DIAGNOSIS — E041 Nontoxic single thyroid nodule: Secondary | ICD-10-CM | POA: Diagnosis not present

## 2016-06-04 ENCOUNTER — Ambulatory Visit (INDEPENDENT_AMBULATORY_CARE_PROVIDER_SITE_OTHER): Payer: BLUE CROSS/BLUE SHIELD | Admitting: Podiatry

## 2016-06-04 ENCOUNTER — Encounter: Payer: Self-pay | Admitting: Podiatry

## 2016-06-04 DIAGNOSIS — L603 Nail dystrophy: Secondary | ICD-10-CM

## 2016-06-04 DIAGNOSIS — Z79899 Other long term (current) drug therapy: Secondary | ICD-10-CM

## 2016-06-04 MED ORDER — TERBINAFINE HCL 250 MG PO TABS: 250.0000 mg | ORAL_TABLET | Freq: Every day | ORAL | 0 refills | Status: DC

## 2016-06-04 NOTE — Progress Notes (Signed)
She presents today for follow-up of her nail fungus. States they're starting to look better.  Objective: Vital signs are stable she is alert and oriented 3 she denied any problems with taking the medication. Her toenail is thick yellow dystrophic onychomycotic hallux left.  Assessment: Long-term therapy with Lamisil secondary to onychomycosis.  Plan: Debridement of all reactive hyperkeratotic tissue today and dispensed another prescription for a liver profile as well as another 90 days worth of medication. Follow up with her in 4 months.

## 2016-06-12 ENCOUNTER — Encounter: Payer: Self-pay | Admitting: Registered Nurse

## 2016-06-12 ENCOUNTER — Encounter: Payer: Self-pay | Admitting: Family Medicine

## 2016-06-12 ENCOUNTER — Ambulatory Visit (INDEPENDENT_AMBULATORY_CARE_PROVIDER_SITE_OTHER): Payer: BLUE CROSS/BLUE SHIELD | Admitting: Family Medicine

## 2016-06-12 ENCOUNTER — Encounter: Payer: BLUE CROSS/BLUE SHIELD | Attending: Physical Medicine & Rehabilitation | Admitting: Registered Nurse

## 2016-06-12 VITALS — BP 125/88 | HR 69

## 2016-06-12 VITALS — BP 120/80 | HR 66 | Wt 169.2 lb

## 2016-06-12 DIAGNOSIS — D481 Neoplasm of uncertain behavior of connective and other soft tissue: Secondary | ICD-10-CM | POA: Diagnosis not present

## 2016-06-12 DIAGNOSIS — M545 Low back pain, unspecified: Secondary | ICD-10-CM

## 2016-06-12 DIAGNOSIS — D259 Leiomyoma of uterus, unspecified: Secondary | ICD-10-CM | POA: Diagnosis not present

## 2016-06-12 DIAGNOSIS — H5442 Blindness, left eye, normal vision right eye: Secondary | ICD-10-CM | POA: Diagnosis not present

## 2016-06-12 DIAGNOSIS — Z9221 Personal history of antineoplastic chemotherapy: Secondary | ICD-10-CM | POA: Insufficient documentation

## 2016-06-12 DIAGNOSIS — G894 Chronic pain syndrome: Secondary | ICD-10-CM | POA: Diagnosis not present

## 2016-06-12 DIAGNOSIS — M5416 Radiculopathy, lumbar region: Secondary | ICD-10-CM | POA: Insufficient documentation

## 2016-06-12 DIAGNOSIS — D649 Anemia, unspecified: Secondary | ICD-10-CM | POA: Diagnosis not present

## 2016-06-12 DIAGNOSIS — I10 Essential (primary) hypertension: Secondary | ICD-10-CM | POA: Diagnosis not present

## 2016-06-12 DIAGNOSIS — M961 Postlaminectomy syndrome, not elsewhere classified: Secondary | ICD-10-CM | POA: Insufficient documentation

## 2016-06-12 DIAGNOSIS — M62838 Other muscle spasm: Secondary | ICD-10-CM

## 2016-06-12 DIAGNOSIS — F419 Anxiety disorder, unspecified: Secondary | ICD-10-CM | POA: Diagnosis not present

## 2016-06-12 DIAGNOSIS — F17210 Nicotine dependence, cigarettes, uncomplicated: Secondary | ICD-10-CM | POA: Diagnosis not present

## 2016-06-12 DIAGNOSIS — M549 Dorsalgia, unspecified: Secondary | ICD-10-CM | POA: Insufficient documentation

## 2016-06-12 DIAGNOSIS — F329 Major depressive disorder, single episode, unspecified: Secondary | ICD-10-CM | POA: Diagnosis not present

## 2016-06-12 DIAGNOSIS — M5136 Other intervertebral disc degeneration, lumbar region: Secondary | ICD-10-CM | POA: Insufficient documentation

## 2016-06-12 DIAGNOSIS — M79606 Pain in leg, unspecified: Secondary | ICD-10-CM | POA: Diagnosis not present

## 2016-06-12 DIAGNOSIS — R21 Rash and other nonspecific skin eruption: Secondary | ICD-10-CM

## 2016-06-12 MED ORDER — HYDROCODONE-ACETAMINOPHEN 5-325 MG PO TABS: 1.0000 | ORAL_TABLET | Freq: Every day | ORAL | 0 refills | Status: DC | PRN

## 2016-06-12 MED ORDER — HYDROCORTISONE 1 % EX OINT: 1.0000 "application " | TOPICAL_OINTMENT | Freq: Two times a day (BID) | CUTANEOUS | 0 refills | Status: DC
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
## 2016-06-23 ENCOUNTER — Telehealth: Payer: Self-pay

## 2016-06-23 DIAGNOSIS — R21 Rash and other nonspecific skin eruption: Secondary | ICD-10-CM

## 2016-06-23 NOTE — Telephone Encounter (Signed)
Pt states that her rash has not improved since last visit with Vickie- she said that she was told to call for script if no improvement.   [phone]

## 2016-06-23 NOTE — Telephone Encounter (Signed)
Pt states that her rash has not improved since last visit with Vickie- she said that she was told to call for script if no improvement.   [phone]

## 2016-06-23 NOTE — Telephone Encounter (Signed)
I'm not sure what prescription was discussed with Sydney [name].   She was already given Hydrocortisone.   I wouldn't use any steroid on the face that is stronger.   So it may be time to refer to dermatology or have her come back in.   She was also advised not to use the same large ear rings she was using as that may have been causing the facial rash

## 2016-06-24 NOTE — Telephone Encounter (Signed)
Can you advise

## 2016-06-24 NOTE — Telephone Encounter (Signed)
I agree that we should refer her to dermatology if the rash is not improving. A stronger steroid is not recommended for the face. Please let me know what she would like to do.

## 2016-06-25 NOTE — Telephone Encounter (Signed)
Pt wanted to go to dermatology for rash I have sent referral info over to [facility] dermatology

## 2016-07-03 ENCOUNTER — Other Ambulatory Visit: Payer: Self-pay | Admitting: Medical

## 2016-07-03 ENCOUNTER — Other Ambulatory Visit: Payer: Self-pay | Admitting: Physical Medicine & Rehabilitation

## 2016-07-03 ENCOUNTER — Institutional Professional Consult (permissible substitution): Payer: BLUE CROSS/BLUE SHIELD | Admitting: Medical

## 2016-07-03 DIAGNOSIS — M961 Postlaminectomy syndrome, not elsewhere classified: Secondary | ICD-10-CM

## 2016-07-03 DIAGNOSIS — I10 Essential (primary) hypertension: Secondary | ICD-10-CM

## 2016-07-03 DIAGNOSIS — M5416 Radiculopathy, lumbar region: Secondary | ICD-10-CM

## 2016-07-03 NOTE — Telephone Encounter (Signed)
Is this okay to refill? 

## 2016-07-10 ENCOUNTER — Encounter: Payer: BLUE CROSS/BLUE SHIELD | Attending: Physical Medicine & Rehabilitation | Admitting: Registered Nurse

## 2016-07-10 DIAGNOSIS — M961 Postlaminectomy syndrome, not elsewhere classified: Secondary | ICD-10-CM | POA: Insufficient documentation

## 2016-07-10 DIAGNOSIS — M5136 Other intervertebral disc degeneration, lumbar region: Secondary | ICD-10-CM | POA: Insufficient documentation

## 2016-07-10 DIAGNOSIS — F329 Major depressive disorder, single episode, unspecified: Secondary | ICD-10-CM | POA: Insufficient documentation

## 2016-07-10 DIAGNOSIS — M5416 Radiculopathy, lumbar region: Secondary | ICD-10-CM | POA: Insufficient documentation

## 2016-07-10 DIAGNOSIS — F419 Anxiety disorder, unspecified: Secondary | ICD-10-CM | POA: Insufficient documentation

## 2016-07-10 DIAGNOSIS — D649 Anemia, unspecified: Secondary | ICD-10-CM | POA: Insufficient documentation

## 2016-07-10 DIAGNOSIS — D259 Leiomyoma of uterus, unspecified: Secondary | ICD-10-CM | POA: Insufficient documentation

## 2016-07-10 DIAGNOSIS — Z9221 Personal history of antineoplastic chemotherapy: Secondary | ICD-10-CM | POA: Insufficient documentation

## 2016-07-10 DIAGNOSIS — M79606 Pain in leg, unspecified: Secondary | ICD-10-CM | POA: Insufficient documentation

## 2016-07-10 DIAGNOSIS — I10 Essential (primary) hypertension: Secondary | ICD-10-CM | POA: Insufficient documentation

## 2016-07-10 DIAGNOSIS — M549 Dorsalgia, unspecified: Secondary | ICD-10-CM | POA: Insufficient documentation

## 2016-07-10 DIAGNOSIS — F17210 Nicotine dependence, cigarettes, uncomplicated: Secondary | ICD-10-CM | POA: Insufficient documentation

## 2016-07-10 DIAGNOSIS — D481 Neoplasm of uncertain behavior of connective and other soft tissue: Secondary | ICD-10-CM | POA: Insufficient documentation

## 2016-07-24 ENCOUNTER — Ambulatory Visit: Payer: BLUE CROSS/BLUE SHIELD | Admitting: Physical Medicine & Rehabilitation

## 2016-08-04 ENCOUNTER — Encounter (HOSPITAL_BASED_OUTPATIENT_CLINIC_OR_DEPARTMENT_OTHER): Payer: BLUE CROSS/BLUE SHIELD | Admitting: Registered Nurse

## 2016-08-04 ENCOUNTER — Encounter: Payer: Self-pay | Admitting: Physical Medicine & Rehabilitation

## 2016-08-04 ENCOUNTER — Encounter: Payer: Self-pay | Admitting: Registered Nurse

## 2016-08-04 VITALS — BP 129/91 | HR 74 | Resp 14

## 2016-08-04 DIAGNOSIS — I10 Essential (primary) hypertension: Secondary | ICD-10-CM | POA: Diagnosis not present

## 2016-08-04 DIAGNOSIS — M545 Low back pain, unspecified: Secondary | ICD-10-CM

## 2016-08-04 DIAGNOSIS — M62838 Other muscle spasm: Secondary | ICD-10-CM | POA: Diagnosis not present

## 2016-08-04 DIAGNOSIS — G894 Chronic pain syndrome: Secondary | ICD-10-CM | POA: Diagnosis not present

## 2016-08-04 DIAGNOSIS — M549 Dorsalgia, unspecified: Secondary | ICD-10-CM | POA: Diagnosis not present

## 2016-08-04 DIAGNOSIS — Z79899 Other long term (current) drug therapy: Secondary | ICD-10-CM

## 2016-08-04 DIAGNOSIS — M5136 Other intervertebral disc degeneration, lumbar region: Secondary | ICD-10-CM | POA: Diagnosis not present

## 2016-08-04 DIAGNOSIS — M961 Postlaminectomy syndrome, not elsewhere classified: Secondary | ICD-10-CM | POA: Diagnosis not present

## 2016-08-04 DIAGNOSIS — F17210 Nicotine dependence, cigarettes, uncomplicated: Secondary | ICD-10-CM | POA: Diagnosis not present

## 2016-08-04 DIAGNOSIS — M5416 Radiculopathy, lumbar region: Secondary | ICD-10-CM | POA: Diagnosis present

## 2016-08-04 DIAGNOSIS — F419 Anxiety disorder, unspecified: Secondary | ICD-10-CM | POA: Diagnosis not present

## 2016-08-04 DIAGNOSIS — D649 Anemia, unspecified: Secondary | ICD-10-CM | POA: Diagnosis not present

## 2016-08-04 DIAGNOSIS — M79606 Pain in leg, unspecified: Secondary | ICD-10-CM | POA: Diagnosis not present

## 2016-08-04 DIAGNOSIS — D481 Neoplasm of uncertain behavior of connective and other soft tissue: Secondary | ICD-10-CM | POA: Diagnosis not present

## 2016-08-04 DIAGNOSIS — Z5181 Encounter for therapeutic drug level monitoring: Secondary | ICD-10-CM

## 2016-08-04 DIAGNOSIS — F329 Major depressive disorder, single episode, unspecified: Secondary | ICD-10-CM | POA: Diagnosis not present

## 2016-08-04 DIAGNOSIS — Z9221 Personal history of antineoplastic chemotherapy: Secondary | ICD-10-CM | POA: Diagnosis not present

## 2016-08-04 DIAGNOSIS — D259 Leiomyoma of uterus, unspecified: Secondary | ICD-10-CM | POA: Diagnosis not present
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
## 2016-08-04 NOTE — Addendum Note (Signed)
Addended by: Elinor Parkinson I on: 08/04/2016 02:18 PM   Modules accepted: Orders

## 2016-08-07 ENCOUNTER — Other Ambulatory Visit: Payer: Self-pay | Admitting: Medical

## 2016-08-12 LAB — TOXASSURE SELECT,+ANTIDEPR,UR

## 2016-08-17 NOTE — Progress Notes (Signed)
Urine drug screen for this encounter is consistent for prescribed medication 

## 2016-08-19 ENCOUNTER — Ambulatory Visit: Payer: BLUE CROSS/BLUE SHIELD | Admitting: Physical Medicine & Rehabilitation

## 2016-09-02 ENCOUNTER — Telehealth: Payer: Self-pay

## 2016-09-02 ENCOUNTER — Ambulatory Visit: Payer: BLUE CROSS/BLUE SHIELD | Admitting: Medical

## 2016-09-02 NOTE — Telephone Encounter (Signed)
D and send no show fee.  If this is >2 no shows, dismiss

## 2016-09-02 NOTE — Telephone Encounter (Signed)

## 2016-09-04 ENCOUNTER — Encounter: Payer: Self-pay | Admitting: Medical

## 2016-09-04 NOTE — Telephone Encounter (Signed)
Per office policy fees only for CPE this was a follow up and first one. No show letter sent

## 2016-09-11 ENCOUNTER — Other Ambulatory Visit: Payer: Self-pay | Admitting: Physical Medicine & Rehabilitation

## 2016-09-11 DIAGNOSIS — M961 Postlaminectomy syndrome, not elsewhere classified: Secondary | ICD-10-CM

## 2016-09-11 DIAGNOSIS — I10 Essential (primary) hypertension: Secondary | ICD-10-CM

## 2016-09-11 DIAGNOSIS — M5416 Radiculopathy, lumbar region: Secondary | ICD-10-CM

## 2016-10-01 ENCOUNTER — Ambulatory Visit: Payer: BLUE CROSS/BLUE SHIELD | Admitting: Podiatry

## 2016-10-13 ENCOUNTER — Encounter (HOSPITAL_COMMUNITY): Payer: Self-pay | Admitting: Family Medicine

## 2016-10-13 ENCOUNTER — Ambulatory Visit (HOSPITAL_COMMUNITY)
Admission: EM | Admit: 2016-10-13 | Discharge: 2016-10-13 | Disposition: A | Discharge status: Home / Self Care | Payer: BLUE CROSS/BLUE SHIELD | Attending: Family Medicine | Admitting: Family Medicine

## 2016-10-13 DIAGNOSIS — R6889 Other general symptoms and signs: Secondary | ICD-10-CM | POA: Diagnosis not present

## 2016-10-13 MED ORDER — BENZONATATE 100 MG PO CAPS: 100.0000 mg | ORAL_CAPSULE | Freq: Three times a day (TID) | ORAL | 0 refills | Status: DC

## 2016-10-13 MED ORDER — OSELTAMIVIR PHOSPHATE 75 MG PO CAPS: 75.0000 mg | ORAL_CAPSULE | Freq: Two times a day (BID) | ORAL | 0 refills | Status: DC
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
## 2016-10-13 NOTE — ED Triage Notes (Signed)
Pt here for cough, fever, body aches that hit her suddenly today. Denies N,V,D.

## 2016-10-13 NOTE — Discharge Instructions (Signed)
You most likely have the flu based on your symptoms. I am writing you a prescription for Tamiflu. Take one tablet twice a day. I am also writing a prescription for cough medicine called Tessalon. Take it three times a day.  Take Tylenol every 4 hours for fever and body aches. Do not exceed 4000 mg a day of tylenol. You also may take Mucenix DM twice a day with a full glass of water. Also, you may take pseudoephedrine for congestion.  Should your symptoms fail to improve or worsen follow up with your primary care provider or return to clinic

## 2016-10-19 ENCOUNTER — Encounter: Payer: 59 | Attending: Physical Medicine & Rehabilitation | Admitting: Physical Medicine & Rehabilitation

## 2016-10-19 ENCOUNTER — Encounter: Payer: Self-pay | Admitting: Physical Medicine & Rehabilitation

## 2016-10-19 VITALS — BP 130/94 | HR 75

## 2016-10-19 DIAGNOSIS — D481 Neoplasm of uncertain behavior of connective and other soft tissue: Secondary | ICD-10-CM | POA: Insufficient documentation

## 2016-10-19 DIAGNOSIS — M961 Postlaminectomy syndrome, not elsewhere classified: Secondary | ICD-10-CM | POA: Insufficient documentation

## 2016-10-19 DIAGNOSIS — M5416 Radiculopathy, lumbar region: Secondary | ICD-10-CM | POA: Insufficient documentation

## 2016-10-19 DIAGNOSIS — F17210 Nicotine dependence, cigarettes, uncomplicated: Secondary | ICD-10-CM | POA: Diagnosis not present

## 2016-10-19 DIAGNOSIS — M549 Dorsalgia, unspecified: Secondary | ICD-10-CM | POA: Insufficient documentation

## 2016-10-19 DIAGNOSIS — F419 Anxiety disorder, unspecified: Secondary | ICD-10-CM | POA: Insufficient documentation

## 2016-10-19 DIAGNOSIS — D259 Leiomyoma of uterus, unspecified: Secondary | ICD-10-CM | POA: Diagnosis not present

## 2016-10-19 DIAGNOSIS — Z9221 Personal history of antineoplastic chemotherapy: Secondary | ICD-10-CM | POA: Insufficient documentation

## 2016-10-19 DIAGNOSIS — M79606 Pain in leg, unspecified: Secondary | ICD-10-CM | POA: Diagnosis not present

## 2016-10-19 DIAGNOSIS — D649 Anemia, unspecified: Secondary | ICD-10-CM | POA: Diagnosis not present

## 2016-10-19 DIAGNOSIS — M5136 Other intervertebral disc degeneration, lumbar region: Secondary | ICD-10-CM | POA: Insufficient documentation

## 2016-10-19 DIAGNOSIS — F329 Major depressive disorder, single episode, unspecified: Secondary | ICD-10-CM | POA: Insufficient documentation

## 2016-10-19 DIAGNOSIS — I10 Essential (primary) hypertension: Secondary | ICD-10-CM | POA: Insufficient documentation

## 2016-10-19 NOTE — Patient Instructions (Signed)
PLEASE FEEL FREE TO CALL OUR OFFICE WITH ANY PROBLEMS OR QUESTIONS ([phone])     YOU NEED TO WORK ROUTINELY ON YOUR LEG AND BACK RANGE OF MOTION AND STRETCHING.
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
## 2017-02-11 ENCOUNTER — Encounter: Payer: Self-pay | Admitting: Podiatry

## 2017-02-11 ENCOUNTER — Ambulatory Visit (INDEPENDENT_AMBULATORY_CARE_PROVIDER_SITE_OTHER): Payer: BLUE CROSS/BLUE SHIELD | Admitting: Podiatry

## 2017-02-11 ENCOUNTER — Ambulatory Visit: Payer: BLUE CROSS/BLUE SHIELD

## 2017-02-11 DIAGNOSIS — L603 Nail dystrophy: Secondary | ICD-10-CM | POA: Diagnosis not present

## 2017-02-11 DIAGNOSIS — M79672 Pain in left foot: Secondary | ICD-10-CM

## 2017-02-14 NOTE — Progress Notes (Signed)
She presents today states that still having problems with his toenails she refers to the hallux left. She states the medication didn't work and she never came back for follow-up.  Objective: Vital signs are stable to alert and oriented 3. Toenail is severely gratified thick dystrophic hallux left. 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  Plan: I offered her avulsion with matrixectomy today but she refused this. She wanted me to cut the nail and smooth it down. And then she decided she wanted me to remove the nail at a different time. She will reschedule to have this nail removed.

## 2017-02-16 ENCOUNTER — Encounter: Payer: BLUE CROSS/BLUE SHIELD | Attending: Registered Nurse | Admitting: Registered Nurse

## 2017-02-19 ENCOUNTER — Other Ambulatory Visit: Payer: Self-pay | Admitting: Medical

## 2017-03-16 ENCOUNTER — Encounter (HOSPITAL_COMMUNITY): Payer: Self-pay | Admitting: *Deleted

## 2017-03-16 ENCOUNTER — Emergency Department (HOSPITAL_COMMUNITY)
Admission: EM | Admit: 2017-03-16 | Discharge: 2017-03-17 | Disposition: A | Discharge status: Home / Self Care | Payer: 59 | Attending: Emergency Medicine | Admitting: Emergency Medicine

## 2017-03-16 DIAGNOSIS — F17210 Nicotine dependence, cigarettes, uncomplicated: Secondary | ICD-10-CM | POA: Insufficient documentation

## 2017-03-16 DIAGNOSIS — I10 Essential (primary) hypertension: Secondary | ICD-10-CM | POA: Insufficient documentation

## 2017-03-16 DIAGNOSIS — M79604 Pain in right leg: Secondary | ICD-10-CM

## 2017-03-16 DIAGNOSIS — F419 Anxiety disorder, unspecified: Secondary | ICD-10-CM | POA: Insufficient documentation

## 2017-03-16 DIAGNOSIS — Z79899 Other long term (current) drug therapy: Secondary | ICD-10-CM | POA: Insufficient documentation

## 2017-03-16 DIAGNOSIS — Z85020 Personal history of malignant carcinoid tumor of stomach: Secondary | ICD-10-CM | POA: Insufficient documentation

## 2017-03-16 NOTE — ED Triage Notes (Signed)
Pt has had R leg pain radiating into thigh x 1 week. Pain worsens on ambulation. Knot noted behind R knee. Denies hx of DVT

## 2017-03-17 ENCOUNTER — Ambulatory Visit (HOSPITAL_BASED_OUTPATIENT_CLINIC_OR_DEPARTMENT_OTHER)
Admission: RE | Admit: 2017-03-17 | Discharge: 2017-03-17 | Disposition: A | Discharge status: Home / Self Care | Payer: Self-pay | Source: Ambulatory Visit | Attending: Emergency Medicine | Admitting: Emergency Medicine

## 2017-03-17 ENCOUNTER — Encounter: Payer: 59 | Admitting: Registered Nurse

## 2017-03-17 DIAGNOSIS — M7989 Other specified soft tissue disorders: Secondary | ICD-10-CM | POA: Insufficient documentation

## 2017-03-17 DIAGNOSIS — M79609 Pain in unspecified limb: Secondary | ICD-10-CM

## 2017-03-17 DIAGNOSIS — M25561 Pain in right knee: Secondary | ICD-10-CM

## 2017-03-17 LAB — I-STAT CHEM 8, ED
BUN: 10 mg/dL (ref 6–20)
CHLORIDE: 104 mmol/L (ref 101–111)
Calcium, Ion: 1.18 mmol/L (ref 1.15–1.40)
Creatinine, Ser: 0.7 mg/dL (ref 0.44–1.00)
GLUCOSE: 122 mg/dL — AB (ref 65–99)
HEMATOCRIT: 36 % (ref 36.0–46.0)
HEMOGLOBIN: 12.2 g/dL (ref 12.0–15.0)
POTASSIUM: 3.5 mmol/L (ref 3.5–5.1)
Sodium: 141 mmol/L (ref 135–145)
TCO2: 30 mmol/L (ref 0–100)

## 2017-03-17 MED ORDER — ENOXAPARIN SODIUM 80 MG/0.8ML ~~LOC~~ SOLN
80.0000 mg | Freq: Once | SUBCUTANEOUS | Status: AC
  Administered 2017-03-17: 80 mg via SUBCUTANEOUS
  Filled 2017-03-17: qty 0.8

## 2017-03-17 NOTE — Progress Notes (Signed)
*  PRELIMINARY RESULTS* Vascular Ultrasound Right lower extremity venous duplex has been completed.  Preliminary findings: No evidence of deep vein thrombosis in the right lower extremity.  Approximately 5-6cm bakers cyst in the right popliteal fossa.   Everrett Coombe 03/17/2017, 8:35 AM
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
## 2017-03-17 NOTE — Discharge Instructions (Signed)
You were seen today for right leg pain. You need to return first thing in the morning for ultrasound to rule out DVT. See instructions on discharge paperwork.

## 2017-08-12 ENCOUNTER — Ambulatory Visit: Payer: BLUE CROSS/BLUE SHIELD | Admitting: Podiatry

## 2017-08-17 ENCOUNTER — Encounter: Payer: Self-pay | Admitting: Podiatry

## 2017-08-17 ENCOUNTER — Ambulatory Visit (INDEPENDENT_AMBULATORY_CARE_PROVIDER_SITE_OTHER): Payer: No Typology Code available for payment source | Admitting: Podiatry

## 2017-08-17 DIAGNOSIS — L603 Nail dystrophy: Secondary | ICD-10-CM

## 2017-08-17 NOTE — Progress Notes (Signed)
Patient presents to office stating her hallux nail left is still thick and very painful. She would like to have the toenail removed, but would like to wait until she has a day off work. She wants the nail trimmed and filed down today and schedule to have the procedure at a different time. She was concerned about the fact her toenail and her fingernails are both thick and discolored but her fungal culture report come back negative. She wanted the toenail re-cultured. I trimmed and filed nail down today and sent the clippings out for culture. Will notify the patient once the results come in.

## 2017-08-25 ENCOUNTER — Telehealth: Payer: Self-pay | Admitting: *Deleted

## 2017-08-25 NOTE — Telephone Encounter (Signed)
Left message informing pt of Dr. Stephenie Acres review of results.

## 2017-08-25 NOTE — Telephone Encounter (Signed)
-----   Message from Garrel Ridgel, [place] sent at 08/24/2017 12:35 PM EST ----- Negative for fungus.

## 2017-12-10 ENCOUNTER — Encounter (HOSPITAL_COMMUNITY): Payer: Self-pay | Admitting: Emergency Medicine

## 2017-12-10 ENCOUNTER — Ambulatory Visit (HOSPITAL_COMMUNITY)
Admission: EM | Admit: 2017-12-10 | Discharge: 2017-12-10 | Disposition: A | Discharge status: Home / Self Care | Payer: PRIVATE HEALTH INSURANCE | Attending: Emergency Medicine | Admitting: Emergency Medicine

## 2017-12-10 ENCOUNTER — Emergency Department (HOSPITAL_COMMUNITY)
Admission: EM | Admit: 2017-12-10 | Discharge: 2017-12-10 | Disposition: A | Discharge status: Home / Self Care | Payer: PRIVATE HEALTH INSURANCE | Attending: Emergency Medicine | Admitting: Emergency Medicine

## 2017-12-10 ENCOUNTER — Other Ambulatory Visit: Payer: Self-pay

## 2017-12-10 ENCOUNTER — Encounter (HOSPITAL_COMMUNITY): Payer: Self-pay | Admitting: *Deleted

## 2017-12-10 ENCOUNTER — Emergency Department (HOSPITAL_COMMUNITY): Payer: PRIVATE HEALTH INSURANCE

## 2017-12-10 DIAGNOSIS — F17210 Nicotine dependence, cigarettes, uncomplicated: Secondary | ICD-10-CM | POA: Insufficient documentation

## 2017-12-10 DIAGNOSIS — I10 Essential (primary) hypertension: Secondary | ICD-10-CM | POA: Diagnosis not present

## 2017-12-10 DIAGNOSIS — R1032 Left lower quadrant pain: Secondary | ICD-10-CM | POA: Diagnosis not present

## 2017-12-10 DIAGNOSIS — Z760 Encounter for issue of repeat prescription: Secondary | ICD-10-CM | POA: Diagnosis not present

## 2017-12-10 DIAGNOSIS — K921 Melena: Secondary | ICD-10-CM | POA: Diagnosis present

## 2017-12-10 LAB — COMPREHENSIVE METABOLIC PANEL
ALBUMIN: 3.7 g/dL (ref 3.5–5.0)
ALK PHOS: 73 U/L (ref 38–126)
ALT: 10 U/L — ABNORMAL LOW (ref 14–54)
ANION GAP: 8 (ref 5–15)
AST: 17 U/L (ref 15–41)
BUN: 12 mg/dL (ref 6–20)
CALCIUM: 8.5 mg/dL — AB (ref 8.9–10.3)
CO2: 24 mmol/L (ref 22–32)
Chloride: 108 mmol/L (ref 101–111)
Creatinine, Ser: 0.69 mg/dL (ref 0.44–1.00)
GFR calc Af Amer: >60 mL/min (ref >60)
GFR calc non Af Amer: >60 mL/min (ref >60)
GLUCOSE: 94 mg/dL (ref 65–99)
POTASSIUM: 3.7 mmol/L (ref 3.5–5.1)
SODIUM: 140 mmol/L (ref 135–145)
Total Bilirubin: 0.6 mg/dL (ref 0.3–1.2)
Total Protein: 6.2 g/dL — ABNORMAL LOW (ref 6.5–8.1)

## 2017-12-10 LAB — TYPE AND SCREEN
ABO/RH(D): O POS
Antibody Screen: NEGATIVE

## 2017-12-10 LAB — CBC
HEMATOCRIT: 32.4 % — AB (ref 36.0–46.0)
Hemoglobin: 10.3 g/dL — ABNORMAL LOW (ref 12.0–15.0)
MCH: 29.2 pg (ref 26.0–34.0)
MCHC: 31.8 g/dL (ref 30.0–36.0)
MCV: 91.8 fL (ref 78.0–100.0)
Platelets: 224 10*3/uL (ref 150–400)
RBC: 3.53 MIL/uL — ABNORMAL LOW (ref 3.87–5.11)
RDW: 13.3 % (ref 11.5–15.5)
WBC: 5.7 10*3/uL (ref 4.0–10.5)

## 2017-12-10 LAB — I-STAT BETA HCG BLOOD, ED (MC, WL, AP ONLY): I-stat hCG, quantitative: <5 m[IU]/mL (ref <5)

## 2017-12-10 MED ORDER — SODIUM CHLORIDE 0.9 % IV BOLUS (SEPSIS)
1000.0000 mL | Freq: Once | INTRAVENOUS | Status: AC
  Administered 2017-12-10: 1000 mL via INTRAVENOUS

## 2017-12-10 MED ORDER — IOPAMIDOL (ISOVUE-300) INJECTION 61%
INTRAVENOUS | Status: AC
  Administered 2017-12-10: 100 mL
  Filled 2017-12-10: qty 100

## 2017-12-10 MED ORDER — AMLODIPINE BESYLATE 10 MG PO TABS: 10.0000 mg | ORAL_TABLET | Freq: Every day | ORAL | 0 refills | Status: DC

## 2017-12-10 NOTE — ED Notes (Signed)
Patient transported to CT 

## 2017-12-10 NOTE — Discharge Instructions (Signed)
Nothing to eat or drink until your ER evaluation is complete.  I have also refilled your blood pressure medicine.  Start taking it and keep a log of your blood pressure and follow-up with your primary care physician if it is not well controlled.
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
## 2017-12-10 NOTE — ED Triage Notes (Signed)
Pt in c/o blood in stool for x 3 days multiple times each day, c/o nausea, pt denies v/d, pt sent by UC, pt reports abd tumor removed in 2006, pt A&O x4, pt c/o of difficulty controlling bowels, denies abd pain

## 2017-12-10 NOTE — ED Triage Notes (Addendum)
Pt states for the last three days she noticed dark blood in her stool. States she sees dark blood in every single bowel movement. States her stomach is gurgling more often than normal and shes had a lot of foul smelling gas. Denies abdominal pain. Pt states "I feel really tired and sluggish". Pt also endorses being out of her BP meds for several months. Pt states in 2006 she had tumor attached to her stomach and she had it removed. Pt states she was supopsd to take chemo pills for five years but she only took them for 1 year and shes worried about a complication.
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
## 2017-12-10 NOTE — Discharge Instructions (Signed)
Please call and set up an appointment with [facility] next week for further evaluation of your rectal bleeding.  Return to the ER if your condition worsen or if you have other concerns.

## 2017-12-14 ENCOUNTER — Telehealth: Payer: Self-pay | Admitting: Medical

## 2017-12-14 NOTE — Telephone Encounter (Signed)
Pt was recently seen in the ER and was asked by the ER to go to a GI doctor. Pt said GI doctor's are asking her for a referral from [facility]. Pt is concerned because she does not have insurance so she is attempting to keep cost down as much as possible. Would Sydney [name] be willing to refer pt without her coming in? She is not sure she can afford an appt here and at a GI.

## 2017-12-14 NOTE — Telephone Encounter (Signed)
I am concerned about her use of the emergency dept.  Although her recent visits to the emergency dept were for bleeding, which is fine, there were some other NON emergency visits for leg pain and flu.   We could have seen her for this costing her WAY LESS than going to the emergency dept!!!  Please remind her that we should be her first line of medical care for most things, particular NON emergency issues.    Go ahead and refer to GI but its not appropriate to use the emergency dept for the non emergency things when we can easily see her for those issues.

## 2017-12-15 ENCOUNTER — Other Ambulatory Visit: Payer: Self-pay

## 2017-12-15 DIAGNOSIS — K921 Melena: Secondary | ICD-10-CM

## 2017-12-15 NOTE — Telephone Encounter (Signed)
Sent referral to gi for hematochezia per Audelia Acton

## 2017-12-15 NOTE — Telephone Encounter (Signed)
Called and notified pt  Sent referral to gi . She said that she went to the urgent care they were the ones  Who told her to go  ED.

## 2017-12-15 NOTE — Telephone Encounter (Signed)
Called andl/m for pt to call us back. 

## 2017-12-16 ENCOUNTER — Encounter: Payer: Self-pay | Admitting: Internal Medicine

## 2017-12-17 ENCOUNTER — Encounter: Payer: Self-pay | Admitting: Gastroenterology

## 2017-12-27 ENCOUNTER — Encounter (HOSPITAL_COMMUNITY): Payer: Self-pay | Admitting: Emergency Medicine

## 2017-12-27 ENCOUNTER — Emergency Department (HOSPITAL_COMMUNITY)
Admission: EM | Admit: 2017-12-27 | Discharge: 2017-12-27 | Disposition: A | Discharge status: Home / Self Care | Payer: PRIVATE HEALTH INSURANCE | Attending: Emergency Medicine | Admitting: Emergency Medicine

## 2017-12-27 DIAGNOSIS — F17210 Nicotine dependence, cigarettes, uncomplicated: Secondary | ICD-10-CM | POA: Diagnosis not present

## 2017-12-27 DIAGNOSIS — Y929 Unspecified place or not applicable: Secondary | ICD-10-CM | POA: Diagnosis not present

## 2017-12-27 DIAGNOSIS — Z79899 Other long term (current) drug therapy: Secondary | ICD-10-CM | POA: Diagnosis not present

## 2017-12-27 DIAGNOSIS — Y999 Unspecified external cause status: Secondary | ICD-10-CM | POA: Insufficient documentation

## 2017-12-27 DIAGNOSIS — I10 Essential (primary) hypertension: Secondary | ICD-10-CM | POA: Insufficient documentation

## 2017-12-27 DIAGNOSIS — Y9389 Activity, other specified: Secondary | ICD-10-CM | POA: Insufficient documentation

## 2017-12-27 DIAGNOSIS — S0501XA Injury of conjunctiva and corneal abrasion without foreign body, right eye, initial encounter: Secondary | ICD-10-CM | POA: Diagnosis not present

## 2017-12-27 DIAGNOSIS — X58XXXA Exposure to other specified factors, initial encounter: Secondary | ICD-10-CM | POA: Diagnosis not present

## 2017-12-27 DIAGNOSIS — H5711 Ocular pain, right eye: Secondary | ICD-10-CM | POA: Diagnosis present

## 2017-12-27 MED ORDER — TETRACAINE HCL 0.5 % OP SOLN
2.0000 [drp] | Freq: Once | OPHTHALMIC | Status: AC
Start: 2017-12-27 — End: 2017-12-27
  Administered 2017-12-27: 2 [drp] via OPHTHALMIC
  Filled 2017-12-27: qty 4

## 2017-12-27 MED ORDER — CIPROFLOXACIN HCL 0.3 % OP OINT: TOPICAL_OINTMENT | OPHTHALMIC | 0 refills | Status: DC

## 2017-12-27 MED ORDER — FLUORESCEIN SODIUM 1 MG OP STRP
1.0000 | ORAL_STRIP | Freq: Once | OPHTHALMIC | Status: AC
  Administered 2017-12-27: 1 via OPHTHALMIC
  Filled 2017-12-27: qty 1
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
## 2017-12-27 NOTE — ED Notes (Signed)
Provider at bedside. Stated will examine eye first before eye irrigation.

## 2017-12-27 NOTE — Discharge Instructions (Addendum)
Please take all of your antibiotics until finished!  Alternate 600 mg of ibuprofen and [phone] mg of Tylenol every 3 hours as needed for pain. Do not exceed 4000 mg of Tylenol daily.  Apply cool compresses to the eye for comfort.  Follow-up with ophthalmology in the next 24-48 hours for reevaluation of symptoms.  Return to emergency department if any concerning signs or symptoms develop.

## 2017-12-27 NOTE — ED Triage Notes (Signed)
Pt to ER for evaluation of "contact stuck in my right eye, like it's literally folded in there and I can't get it." pt states hx of blindness in left eye so concerned for good eye being hurt.

## 2018-02-01 ENCOUNTER — Ambulatory Visit: Payer: PRIVATE HEALTH INSURANCE | Admitting: Gastroenterology

## 2018-03-08 ENCOUNTER — Other Ambulatory Visit: Payer: Self-pay | Admitting: Medical

## 2018-03-08 NOTE — Telephone Encounter (Signed)
Left message on voicemail for patient to call in to make appointment for CPE since she has not been seen in 2 years.

## 2018-03-08 NOTE — Telephone Encounter (Signed)
Spoke to patient, she did not want to schedule an appointment at this time. Pt stated she is waiting for her insurance to start then she will schedule an appointment for further refills. No refills needed at this time.

## 2018-04-29 ENCOUNTER — Ambulatory Visit (HOSPITAL_COMMUNITY)
Admission: EM | Admit: 2018-04-29 | Discharge: 2018-04-29 | Disposition: A | Discharge status: Home / Self Care | Payer: PRIVATE HEALTH INSURANCE | Attending: Family Medicine | Admitting: Family Medicine

## 2018-04-29 ENCOUNTER — Encounter (HOSPITAL_COMMUNITY): Payer: Self-pay

## 2018-04-29 DIAGNOSIS — J0100 Acute maxillary sinusitis, unspecified: Secondary | ICD-10-CM | POA: Diagnosis not present

## 2018-04-29 MED ORDER — BENZONATATE 200 MG PO CAPS: 200.0000 mg | ORAL_CAPSULE | Freq: Two times a day (BID) | ORAL | 0 refills | Status: DC | PRN

## 2018-04-29 MED ORDER — AMOXICILLIN-POT CLAVULANATE 875-125 MG PO TABS: 1.0000 | ORAL_TABLET | Freq: Two times a day (BID) | ORAL | 0 refills | Status: DC
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
## 2018-04-29 NOTE — ED Triage Notes (Signed)
Pt presents with cold symptoms x 1 week, no relief with otc medication.

## 2018-04-29 NOTE — Discharge Instructions (Addendum)
Drink plenty of fluids Take Mucinex DM twice a day Take Tessalon twice a day as needed for coughing Take the antibiotic as directed Consider a saline nasal spray or nasal wash Expect improvement over the next 2 to 3 days See your primary care doctor to discuss gastro referral and colonoscopy

## 2018-08-24 ENCOUNTER — Emergency Department (HOSPITAL_COMMUNITY): Payer: 59

## 2018-08-24 ENCOUNTER — Inpatient Hospital Stay (HOSPITAL_COMMUNITY)
Admission: EM | Admit: 2018-08-24 | Discharge: 2018-08-28 | DRG: 386 | Disposition: A | Discharge status: Home / Self Care | Payer: 59 | Attending: Family Medicine | Admitting: Family Medicine

## 2018-08-24 ENCOUNTER — Encounter (HOSPITAL_COMMUNITY): Payer: Self-pay

## 2018-08-24 ENCOUNTER — Other Ambulatory Visit: Payer: Self-pay

## 2018-08-24 DIAGNOSIS — Z9114 Patient's other noncompliance with medication regimen: Secondary | ICD-10-CM

## 2018-08-24 DIAGNOSIS — Z79899 Other long term (current) drug therapy: Secondary | ICD-10-CM

## 2018-08-24 DIAGNOSIS — Z809 Family history of malignant neoplasm, unspecified: Secondary | ICD-10-CM

## 2018-08-24 DIAGNOSIS — K922 Gastrointestinal hemorrhage, unspecified: Secondary | ICD-10-CM

## 2018-08-24 DIAGNOSIS — K51911 Ulcerative colitis, unspecified with rectal bleeding: Principal | ICD-10-CM | POA: Diagnosis present

## 2018-08-24 DIAGNOSIS — K8020 Calculus of gallbladder without cholecystitis without obstruction: Secondary | ICD-10-CM | POA: Diagnosis present

## 2018-08-24 DIAGNOSIS — R42 Dizziness and giddiness: Secondary | ICD-10-CM | POA: Diagnosis not present

## 2018-08-24 DIAGNOSIS — I10 Essential (primary) hypertension: Secondary | ICD-10-CM | POA: Diagnosis present

## 2018-08-24 DIAGNOSIS — Z9221 Personal history of antineoplastic chemotherapy: Secondary | ICD-10-CM

## 2018-08-24 DIAGNOSIS — R51 Headache: Secondary | ICD-10-CM | POA: Diagnosis not present

## 2018-08-24 DIAGNOSIS — C49A2 Gastrointestinal stromal tumor of stomach: Secondary | ICD-10-CM

## 2018-08-24 DIAGNOSIS — E041 Nontoxic single thyroid nodule: Secondary | ICD-10-CM | POA: Diagnosis present

## 2018-08-24 DIAGNOSIS — K449 Diaphragmatic hernia without obstruction or gangrene: Secondary | ICD-10-CM | POA: Diagnosis present

## 2018-08-24 DIAGNOSIS — Z90710 Acquired absence of both cervix and uterus: Secondary | ICD-10-CM

## 2018-08-24 DIAGNOSIS — K5732 Diverticulitis of large intestine without perforation or abscess without bleeding: Secondary | ICD-10-CM | POA: Diagnosis present

## 2018-08-24 DIAGNOSIS — N898 Other specified noninflammatory disorders of vagina: Secondary | ICD-10-CM

## 2018-08-24 DIAGNOSIS — Z903 Acquired absence of stomach [part of]: Secondary | ICD-10-CM

## 2018-08-24 DIAGNOSIS — N760 Acute vaginitis: Secondary | ICD-10-CM | POA: Diagnosis not present

## 2018-08-24 DIAGNOSIS — F17210 Nicotine dependence, cigarettes, uncomplicated: Secondary | ICD-10-CM | POA: Diagnosis present

## 2018-08-24 DIAGNOSIS — R634 Abnormal weight loss: Secondary | ICD-10-CM | POA: Diagnosis present

## 2018-08-24 DIAGNOSIS — K625 Hemorrhage of anus and rectum: Secondary | ICD-10-CM

## 2018-08-24 DIAGNOSIS — D62 Acute posthemorrhagic anemia: Secondary | ICD-10-CM | POA: Diagnosis present

## 2018-08-24 DIAGNOSIS — Z9079 Acquired absence of other genital organ(s): Secondary | ICD-10-CM

## 2018-08-24 DIAGNOSIS — B9689 Other specified bacterial agents as the cause of diseases classified elsewhere: Secondary | ICD-10-CM | POA: Diagnosis not present

## 2018-08-24 DIAGNOSIS — Z85028 Personal history of other malignant neoplasm of stomach: Secondary | ICD-10-CM

## 2018-08-24 DIAGNOSIS — H5462 Unqualified visual loss, left eye, normal vision right eye: Secondary | ICD-10-CM | POA: Diagnosis present

## 2018-08-24 DIAGNOSIS — Z823 Family history of stroke: Secondary | ICD-10-CM

## 2018-08-24 DIAGNOSIS — Z818 Family history of other mental and behavioral disorders: Secondary | ICD-10-CM

## 2018-08-24 DIAGNOSIS — Z90722 Acquired absence of ovaries, bilateral: Secondary | ICD-10-CM

## 2018-08-24 DIAGNOSIS — D500 Iron deficiency anemia secondary to blood loss (chronic): Secondary | ICD-10-CM | POA: Diagnosis not present

## 2018-08-24 DIAGNOSIS — F329 Major depressive disorder, single episode, unspecified: Secondary | ICD-10-CM | POA: Diagnosis present

## 2018-08-24 DIAGNOSIS — Z8249 Family history of ischemic heart disease and other diseases of the circulatory system: Secondary | ICD-10-CM

## 2018-08-24 DIAGNOSIS — R000 Tachycardia, unspecified: Secondary | ICD-10-CM | POA: Diagnosis not present

## 2018-08-24 DIAGNOSIS — Z833 Family history of diabetes mellitus: Secondary | ICD-10-CM

## 2018-08-24 DIAGNOSIS — R410 Disorientation, unspecified: Secondary | ICD-10-CM

## 2018-08-24 DIAGNOSIS — Z973 Presence of spectacles and contact lenses: Secondary | ICD-10-CM

## 2018-08-24 LAB — TYPE AND SCREEN
ABO/RH(D): O POS
ANTIBODY SCREEN: NEGATIVE

## 2018-08-24 LAB — COMPREHENSIVE METABOLIC PANEL
ALBUMIN: 3.9 g/dL (ref 3.5–5.0)
ALK PHOS: 79 U/L (ref 38–126)
ALT: 12 U/L (ref 0–44)
ANION GAP: 8 (ref 5–15)
AST: 20 U/L (ref 15–41)
BILIRUBIN TOTAL: 0.7 mg/dL (ref 0.3–1.2)
BUN: 11 mg/dL (ref 6–20)
CALCIUM: 8.8 mg/dL — AB (ref 8.9–10.3)
CO2: 25 mmol/L (ref 22–32)
CREATININE: 0.8 mg/dL (ref 0.44–1.00)
Chloride: 106 mmol/L (ref 98–111)
GFR calc Af Amer: >60 mL/min (ref >60)
GFR calc non Af Amer: >60 mL/min (ref >60)
Glucose, Bld: 118 mg/dL — ABNORMAL HIGH (ref 70–99)
Potassium: 3.6 mmol/L (ref 3.5–5.1)
Sodium: 139 mmol/L (ref 135–145)
TOTAL PROTEIN: 6.6 g/dL (ref 6.5–8.1)

## 2018-08-24 LAB — CBC
HCT: 42.4 % (ref 36.0–46.0)
HCT: 39.9 % (ref 36.0–46.0)
HCT: 37.2 % (ref 36.0–46.0)
Hemoglobin: 13.3 g/dL (ref 12.0–15.0)
Hemoglobin: 11.9 g/dL — ABNORMAL LOW (ref 12.0–15.0)
Hemoglobin: 11.8 g/dL — ABNORMAL LOW (ref 12.0–15.0)
MCH: 29 pg (ref 26.0–34.0)
MCH: 28.1 pg (ref 26.0–34.0)
MCH: 29 pg (ref 26.0–34.0)
MCHC: 31.4 g/dL (ref 30.0–36.0)
MCHC: 29.8 g/dL — AB (ref 30.0–36.0)
MCHC: 31.7 g/dL (ref 30.0–36.0)
MCV: 92.4 fL (ref 80.0–100.0)
MCV: 94.3 fL (ref 80.0–100.0)
MCV: 91.4 fL (ref 80.0–100.0)
NRBC: 0 % (ref 0.0–0.2)
PLATELETS: 250 10*3/uL (ref 150–400)
PLATELETS: 247 10*3/uL (ref 150–400)
Platelets: 218 10*3/uL (ref 150–400)
RBC: 4.59 MIL/uL (ref 3.87–5.11)
RBC: 4.23 MIL/uL (ref 3.87–5.11)
RBC: 4.07 MIL/uL (ref 3.87–5.11)
RDW: 14.3 % (ref 11.5–15.5)
RDW: 14.3 % (ref 11.5–15.5)
RDW: 14.3 % (ref 11.5–15.5)
WBC: 6.2 10*3/uL (ref 4.0–10.5)
WBC: 5.4 10*3/uL (ref 4.0–10.5)
WBC: 7.8 10*3/uL (ref 4.0–10.5)
nRBC: 0 % (ref 0.0–0.2)
nRBC: 0 % (ref 0.0–0.2)

## 2018-08-24 LAB — I-STAT TROPONIN, ED: TROPONIN I, POC: 0 ng/mL (ref 0.00–0.08)

## 2018-08-24 LAB — URINALYSIS, ROUTINE W REFLEX MICROSCOPIC
BACTERIA UA: NONE SEEN
Bilirubin Urine: NEGATIVE
Glucose, UA: NEGATIVE mg/dL
KETONES UR: 5 mg/dL — AB
Leukocytes, UA: NEGATIVE
Nitrite: NEGATIVE
PROTEIN: NEGATIVE mg/dL
Specific Gravity, Urine: 1.023 (ref 1.005–1.030)
pH: 5 (ref 5.0–8.0)

## 2018-08-24 LAB — POC OCCULT BLOOD, ED: Fecal Occult Bld: POSITIVE — AB

## 2018-08-24 LAB — LIPASE, BLOOD: Lipase: 39 U/L (ref 11–51)

## 2018-08-24 MED ORDER — SODIUM CHLORIDE 0.9 % IV BOLUS
1000.0000 mL | Freq: Once | INTRAVENOUS | Status: AC
  Administered 2018-08-24: 1000 mL via INTRAVENOUS

## 2018-08-24 MED ORDER — IOHEXOL 300 MG/ML  SOLN
100.0000 mL | Freq: Once | INTRAMUSCULAR | Status: AC | PRN
  Administered 2018-08-24: 100 mL via INTRAVENOUS

## 2018-08-24 MED ORDER — LABETALOL HCL 5 MG/ML IV SOLN: 5.0000 mg | INTRAVENOUS | Status: DC | PRN

## 2018-08-24 MED ORDER — NICOTINE 21 MG/24HR TD PT24
21.0000 mg | MEDICATED_PATCH | Freq: Every day | TRANSDERMAL | Status: DC
  Administered 2018-08-25 – 2018-08-28 (×4): 21 mg via TRANSDERMAL
  Filled 2018-08-24 (×4): qty 1
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
## 2018-08-24 NOTE — ED Notes (Signed)
Pt. Stated, Im not going to stay here if I can't eat. Placed a call to Dr. , will call me back.

## 2018-08-24 NOTE — ED Notes (Signed)
[facility] to [place], [place]

## 2018-08-24 NOTE — ED Notes (Signed)
Report to nurse 5 W room 12.

## 2018-08-24 NOTE — ED Notes (Signed)
Patient returned from CT
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
## 2018-08-24 NOTE — ED Notes (Signed)
Report to 5 [place]

## 2018-08-24 NOTE — ED Triage Notes (Signed)
Pt endorses bloody stools/rectal bleed since last night. Multiple occurrences. Also has been light headed since last night. Hx of stomach tumor 2006. Hx of htn and does not take meds for it. Axox4.

## 2018-08-24 NOTE — ED Notes (Signed)
Patient Transported to Xray at this time.

## 2018-08-24 NOTE — Progress Notes (Signed)
Sydney [name] is a 47 y.o. female patient admitted from ED awake, alert - oriented  X 4 - no acute distress noted.  VSS - Blood pressure (!) 137/100, pulse 68, temperature 98.2 F (36.8 C), temperature source Oral, resp. rate 15, height 5' 9"  (1.753 m), weight 79.8 kg, last menstrual period 04/26/2016, SpO2 99 %.    IV in place, occlusive dsg intact without redness.  Orientation to room, and floor completed with information packet given to patient/family.  Patient declined safety video at this time.  Admission INP armband ID verified with patient/family, and in place.   SR up x 2, fall assessment complete, with patient and family able to verbalize understanding of risk associated with falls, and verbalized understanding to call nsg before up out of bed.  Call light within reach, patient able to voice, and demonstrate understanding.  Skin, clean-dry- intact without evidence of bruising, or skin tears.   No evidence of skin break down noted on exam. Patient placed on PC monitor [id number]. Pt experienced 2 rectal bleeding episodes within 30 minutes of arrival.    Will cont to eval and treat per MD orders.  Valorie Roosevelt, RN 08/24/2018 8:26 PM

## 2018-08-24 NOTE — ED Notes (Signed)
Family at bedside. 

## 2018-08-25 ENCOUNTER — Other Ambulatory Visit: Payer: Self-pay

## 2018-08-25 DIAGNOSIS — K625 Hemorrhage of anus and rectum: Secondary | ICD-10-CM

## 2018-08-25 DIAGNOSIS — R42 Dizziness and giddiness: Secondary | ICD-10-CM

## 2018-08-25 DIAGNOSIS — R410 Disorientation, unspecified: Secondary | ICD-10-CM

## 2018-08-25 LAB — PROTIME-INR
INR: 1.06
PROTHROMBIN TIME: 13.7 s (ref 11.4–15.2)

## 2018-08-25 LAB — CBC
HEMATOCRIT: 34.4 % — AB (ref 36.0–46.0)
HEMATOCRIT: 32.1 % — AB (ref 36.0–46.0)
HEMOGLOBIN: 10.5 g/dL — AB (ref 12.0–15.0)
Hemoglobin: 10.3 g/dL — ABNORMAL LOW (ref 12.0–15.0)
MCH: 29.3 pg (ref 26.0–34.0)
MCH: 28.2 pg (ref 26.0–34.0)
MCHC: 30.5 g/dL (ref 30.0–36.0)
MCHC: 32.1 g/dL (ref 30.0–36.0)
MCV: 92.2 fL (ref 80.0–100.0)
MCV: 91.5 fL (ref 80.0–100.0)
Platelets: 225 10*3/uL (ref 150–400)
Platelets: 248 10*3/uL (ref 150–400)
RBC: 3.51 MIL/uL — ABNORMAL LOW (ref 3.87–5.11)
RBC: 3.73 MIL/uL — AB (ref 3.87–5.11)
RDW: 14.4 % (ref 11.5–15.5)
RDW: 14.3 % (ref 11.5–15.5)
WBC: 6.2 10*3/uL (ref 4.0–10.5)
WBC: 6.8 10*3/uL (ref 4.0–10.5)
nRBC: 0 % (ref 0.0–0.2)
nRBC: 0 % (ref 0.0–0.2)

## 2018-08-25 LAB — BASIC METABOLIC PANEL
ANION GAP: 6 (ref 5–15)
BUN: 13 mg/dL (ref 6–20)
CHLORIDE: 109 mmol/L (ref 98–111)
CO2: 25 mmol/L (ref 22–32)
Calcium: 8.3 mg/dL — ABNORMAL LOW (ref 8.9–10.3)
Creatinine, Ser: 0.81 mg/dL (ref 0.44–1.00)
GFR calc Af Amer: >60 mL/min (ref >60)
GLUCOSE: 104 mg/dL — AB (ref 70–99)
Potassium: 3.5 mmol/L (ref 3.5–5.1)
Sodium: 140 mmol/L (ref 135–145)

## 2018-08-25 LAB — HEMOGLOBIN AND HEMATOCRIT, BLOOD
HEMATOCRIT: 33.7 % — AB (ref 36.0–46.0)
HEMOGLOBIN: 10.6 g/dL — AB (ref 12.0–15.0)

## 2018-08-25 LAB — MRSA PCR SCREENING: MRSA by PCR: NEGATIVE

## 2018-08-25 LAB — HIV ANTIBODY (ROUTINE TESTING W REFLEX): HIV Screen 4th Generation wRfx: NONREACTIVE

## 2018-08-25 MED ORDER — PEG-KCL-NACL-NASULF-NA ASC-C 100 G PO SOLR: 1.0000 | Freq: Once | ORAL | Status: DC

## 2018-08-25 MED ORDER — ACETAMINOPHEN 10 MG/ML IV SOLN
1000.0000 mg | Freq: Four times a day (QID) | INTRAVENOUS | Status: AC | PRN
  Administered 2018-08-25: 1000 mg via INTRAVENOUS
  Filled 2018-08-25: qty 100

## 2018-08-25 MED ORDER — PEG-KCL-NACL-NASULF-NA ASC-C 100 G PO SOLR
0.5000 | Freq: Once | ORAL | Status: AC
  Administered 2018-08-25: 100 g via ORAL
  Filled 2018-08-25: qty 1

## 2018-08-25 MED ORDER — PANTOPRAZOLE SODIUM 40 MG IV SOLR
40.0000 mg | Freq: Every day | INTRAVENOUS | Status: DC
  Administered 2018-08-25 – 2018-08-27 (×3): 40 mg via INTRAVENOUS
  Filled 2018-08-25 (×3): qty 40

## 2018-08-25 NOTE — Discharge Instructions (Signed)
Dear Sydney [name],   Thank you for letting us participate in your care! In this section, you will find a brief hospital admission summary of why you were admitted to the hospital, what happened during your admission, your diagnosis/diagnoses, and recommended follow up.   You were admitted because you were experiencing blood loss per rectum. GI doctors recommended upper and lower endoscopy to find the source of bleeding.   Your bleeding  improved and you were discharged from the hospital for meeting this goal.   You are also being sent home with Flagyl, an antibiotic that will help with vaginal symptoms caused by bacterial vaginosis.    POST-HOSPITAL & CARE INSTRUCTIONS 1. Please complete your entire course of flagyl 2.  3. Please let PCP/Specialists know of any changes that were made.  4. Please see medications section of this packet for any medication changes.   DOCTOR'S APPOINTMENT & FOLLOW UP CARE INSTRUCTIONS  No future appointments.   Thank you for choosing [facility]! Take care and be well!  [facility]  [place], [place] [address] [phone]

## 2018-08-25 NOTE — Progress Notes (Signed)
Patient this AM alert and oriented, complaining of headache. No PRN order for Tylenol or other medicine. Also, patient asked for a diet or she would like to know why she is NPO. Writer paged MD and informed about patient's concerns. Will continue to monitor.

## 2018-08-25 NOTE — Discharge Summary (Addendum)
CALL PAGER [phone] for any questions or notifications regarding this patient   FMTS Attending Daily Note: Sydney Singh, MD  Pager [phone]  Office [phone] I have seen and examined this patient, reviewed their chart. I have discussed this patient with the resident. I agree with the resident's findings, assessment and care plan.   Sydney [name] Discharge Summary  Patient name: Sydney [name] Medical record number: [id number] Date of birth: [birth year] Age: 47 y.o. Gender: female Date of Admission: 08/24/2018  Date of Discharge: 08/28/18   Admitting Physician: Sydney Reasons, MD  Primary Care Provider: Carlena Hurl, PA-C Consultants: GI  Indication for Hospitalization: BRBPR  Discharge Diagnoses/Problem List:  Active Problems:   Acute GI bleeding   Confusion   Lightheadedness   Rectal bleeding   Vagina itching  Disposition: Discharge to home  Discharge Condition: Stable  Discharge Exam:  BP (!) 141/105 (BP Location: Right Arm)   Pulse 68   Temp 97.8 F (36.6 C)   Resp 20   Ht 5' 9"  (1.753 m)   Wt 79.8 kg   LMP 04/26/2016   SpO2 99%   BMI 25.99 kg/m   Physical Exam:  Gen: NAD, alert, non-toxic, well-appearing, sitting comfortably  Skin: Warm and dry HEENT: NCAT.  MMM.  CV: RRR.  Normal S1-S2. No BLEE. Resp: CTAB. No increased WOB Abd: NTND on palpation to all 4 quadrants.  Pos bowel sounds Extremities: moves extremities spontaneously. Warm and well perfused.   Brief [name] Course:  Sydney [name] is a 47 y.o. female with past medical history significant for GI stromal tumor in 2010, tobacco use, hypertension, who presented with complaints of painless bright red blood per rectum over the last 24 hours with initial hemoglobin of 13.3 that decreased to 11.9/4 hours.  Initial work up with significant for CT abdomen shows proximal colonic diverticula and cholelithiasis.  Initial differential was large.  Of note, patient had  similar presentation in March of last year which is more consistent with a chronic issue. GI was consulted for positive FOBT and complaints of 3 bloody clotted stools on the night of admission.  She was taken for upper and lower scopes But both were "unrevealing for source of abdominal pain, heme positive stool or anemia" per GI note.  The only finding of significance was friable mucosa throughout the entire colon, which is consistent with ulcerative colitis but would not explain major blood clots in stool.  The following day, she underwent capsule endoscopy. Results had not returned by time of discharge.  During her admission, patient complained of symptoms of BV, she was started on metronidazole gel x 5 days.  Additionally, she reported having 3 new sexual partners within recent history and requested HIV, RPR, GC chlamydia.  HIV and RPR were negative.  GC chlamydia results were not competed prior to discharge.   Issues for Follow Up:  1. Recurrence of bloody stools or abdominal pain 2. Bacterial vaginosis treatment completion and continued symptoms.  3. Follow-up CBC and begin ferrous sulfate once daily  4. Follow up capsul endoscopy results   Significant Procedures:   Procedure Orders     Procedural/ Surgical Case Request: ESOPHAGOGASTRODUODENOSCOPY (EGD) WITH PROPOFOL, COLONOSCOPY WITH PROPOFOL     UPPER ENDOSCOPY     COLONOSCOPY     Procedural/ Surgical Case Request: GIVENS CAPSULE STUDY     ED EKG     EKG 12-Lead     EKG 12-Lead   Significant Labs and Imaging:  Recent Labs  Lab 08/26/18 0638 08/27/18 0354 08/28/18 0456  WBC 5.1 5.6 5.8  HGB 10.1* 10.4* 10.5*  HCT 32.9* 32.4* 32.8*  PLT 224 221 244   Recent Labs  Lab 08/24/18 0820 08/25/18 0453 08/28/18 0507  NA 139 140 139  K 3.6 3.5 3.5  CL 106 109 106  CO2 25 25 26   GLUCOSE 118* 104* 94  BUN 11 13 <5*  CREATININE 0.80 0.81 0.82  CALCIUM 8.8* 8.3* 8.8*  ALKPHOS 79  --   --   AST 20  --   --   ALT 12  --   --    ALBUMIN 3.9  --   --     Ct Abdomen Pelvis W Contrast  Result Date: 08/24/2018 CLINICAL DATA:  Abdominal pain, rectal bleeding EXAM: CT ABDOMEN AND PELVIS WITH CONTRAST TECHNIQUE: Multidetector CT imaging of the abdomen and pelvis was performed using the standard protocol following bolus administration of intravenous contrast. CONTRAST:  142m OMNIPAQUE IOHEXOL 300 MG/ML  SOLN COMPARISON:  12/10/2017 FINDINGS: Lower chest: Dependent atelectasis in the lung bases. Heart is borderline in size. No effusions. Hepatobiliary: 6 mm gallstone layering within the gallbladder. Diffuse fatty infiltration of the liver. No focal hepatic abnormality or biliary ductal dilatation. Pancreas: No focal abnormality or ductal dilatation. Spleen: No focal abnormality.  Normal size. Adrenals/Urinary Tract: No adrenal abnormality. No focal renal abnormality. No stones or hydronephrosis. Urinary bladder is unremarkable. Stomach/Bowel: Normal appendix. Stomach, large and small bowel grossly unremarkable. Postoperative changes in the stomach. Vascular/Lymphatic: No evidence of aneurysm or adenopathy. Reproductive: Prior hysterectomy.  No adnexal masses. Other: No free fluid or free air. Musculoskeletal: No acute bony abnormality. IMPRESSION: Cholelithiasis.  No CT evidence of cholecystitis. Fatty infiltration of the liver. Postoperative changes in the stomach. No acute bowel abnormality visualized. Normal appendix. Bibasilar atelectasis. Electronically Signed   By: KRolm BaptiseM.D.   On: 08/24/2018 11:20    Results/Tests Pending at Time of Discharge:  .Marland KitchenGonorrhea Chlamydia  . Capsule endoscopy   Discharge Medications:  Allergies as of 08/28/2018   No Known Allergies     Medication List    STOP taking these medications   amoxicillin-clavulanate 875-125 MG tablet Commonly known as:  AUGMENTIN   BC HEADACHE POWDER PO   benzonatate 200 MG capsule Commonly known as:  TESSALON     TAKE these medications    acetaminophen 325 MG tablet Commonly known as:  TYLENOL Take 2 tablets (650 mg total) by mouth every 6 (six) hours as needed for headache.   amLODipine 10 MG tablet Commonly known as:  NORVASC Take 1 tablet (10 mg total) by mouth daily.   metroNIDAZOLE 0.75 % vaginal gel Commonly known as:  METROGEL Place 1 Applicatorful vaginally at bedtime.   nicotine 21 mg/24hr patch Commonly known as:  NICODERM CQ - dosed in mg/24 hours Place 1 patch (21 mg total) onto the skin daily.   pantoprazole 40 MG tablet Commonly known as:  PROTONIX Take 1 tablet (40 mg total) by mouth daily.       Discharge Instructions: Please refer to Patient Instructions section of EMR for full details.  Patient was counseled important signs and symptoms that should prompt return to medical care, changes in medications, dietary instructions, activity restrictions, and follow up appointments.   Follow-Up Appointments: No future appointments.  RZettie Cooley MD  08/28/2018, 8:08 PM PGY-1, [place]

## 2018-08-25 NOTE — Progress Notes (Signed)
Family Medicine Teaching Service Daily Progress Note Intern Pager: [phone]  Patient name: Sydney [name] Medical record number: [id number] Date of birth: 10/30/1970 Age: 47 y.o. Gender: female  Primary Care Provider: Carlena Hurl, PA-C Consultants: GI   Code Status: Full code  Pt Overview and Major Events to Date:  Admitted: 08/24/2018  Hospital Day: 2   Assessment and Plan: Sydney [name] is a 47 y.o. female presenting with GI bleed. PMH is significant for GIST 2010, Tobacco Use Disorder, and HTN.  Bright Red Blood per Rectum & Abdominal Pain:  Hemoglobin is 10.5 and 10.3 yesterday (presented at 13.3 > 11.9 in a few hours).  Overnight, team received calls for 3 occurrences of Ludy stools. FOBT +, CT diverticula.  Differential includes diverticulitis with history of diverticulosis, ischemic colitis, IBD flare, gastric ulcer, lower GI bleed versus recurrence of GI ST as patient's oral chemo was ended early.  Additionally she has had unintentional weight loss in the past few months.  Hepatobiliary likely not involved as lipase is normal.  Patient remains afebrile with normal heart rate making infection much less likely.  Patient was seen in March in the ED.  GI offered outpatient follow-up with which she did not do schedule.   A.m. CBC.  Repeat H&H at 1400  Consult GI for reports of 3 bloody stools overnight  Continuous pulse ox to monitor tachycardia  Continue to be n.p.o. until GI sees patient  IV PTX started this AM  Hypertension Patient is on Norvasc 10 mm grams at home, which she reports that she does not take.    Continuing to monitor pressures   Labetalol as needed   Holding Norvasc 1 p.o.   Tobacco Use Disorder:  10-pack-year history   21 mg nicotine patch   Fluids: Saline lock Electrolytes: Replete PRN Nutrition: NPO  GI ppx: IV PPT DVT ppx: SCD  Future labs: AM CBC  Disposition: transfer to med surg   Medications: Scheduled Meds: .  nicotine  21 mg Transdermal Daily   Continuous Infusions: PRN Meds: labetalol  Subjective  Patient reports welll overnight. She is hungry this morning. Is agreeable with GI consult today. Had headache as she usually drinks caffeine every day. Got nicotine patch this AM.  Objective:   Vital Signs Temp:  [97.9 F (36.6 C)-98.2 F (36.8 C)] 98.1 F (36.7 C) (11/21 0742) Pulse Rate:  [55-103] 56 (11/21 0742) Resp:  [11-23] 14 (11/21 0742) BP: (120-186)/(88-119) 134/88 (11/21 0742) SpO2:  [94 %-100 %] 100 % (11/21 0742) Patient Vitals for the past 24 hrs:  BP Temp Temp src Pulse Resp SpO2  08/25/18 0742 134/88 98.1 F (36.7 C) Oral (!) 56 14 100 %  08/25/18 0456 - 97.9 F (36.6 C) - - - -  08/24/18 2019 (!) 137/100 98.2 F (36.8 C) Oral 68 15 99 %  08/24/18 1947 (!) 120/92 - - 73 12 100 %  08/24/18 1719 (!) 158/107 - - 75 17 98 %  08/24/18 1630 (!) 160/106 - - 70 16 100 %  08/24/18 1600 (!) 157/115 - - 69 15 100 %  08/24/18 1545 (!) 161/119 - - 60 13 99 %  08/24/18 1530 (!) 165/111 - - 61 13 99 %  08/24/18 1400 (!) 152/116 - - 76 15 100 %  08/24/18 1315 (!) 170/114 - - 62 14 100 %  08/24/18 1300 (!) 139/97 - - 68 17 100 %  08/24/18 1230 (!) 148/110 - - 63 16 99 %  08/24/18 1215 (!) 186/113 - - 63 12 94 %  08/24/18 1130 (!) 170/114 - - (!) 55 14 100 %  08/24/18 1100 (!) 155/107 - - (!) 59 - 100 %  08/24/18 1030 (!) 161/117 - - 67 19 100 %  08/24/18 1015 (!) 170/116 - - 68 15 100 %  08/24/18 0930 (!) 147/106 - - (!) 103 (!) 22 100 %  08/24/18 0915 (!) 160/119 - - 74 11 100 %  08/24/18 0900 (!) 149/111 - - 86 (!) 23 100 %    Intake/Output No intake or output data in the 24 hours ending 08/25/18 0831 Intake/Output      11/20 0701 - 11/21 0700 11/21 0701 - 11/22 0700        Stool Occurrence 2 x      Physical Exam  Gen: NAD, alert, non-toxic, well-appearing, sitting comfortably  Skin: Warm and dry HEENT: NCAT.  MMM.  CV: RRR.  Normal S1-S2. No BLEE. Resp: CTAB. No  increased WOB Abd: NTND on palpation to all 4 quadrants.  Pos bowel sounds Extremities: moves extremities spontaneously. Warm and well perfused.   Laboratory: Recent Labs  Lab 08/24/18 2037 08/25/18 0036 08/25/18 0453  WBC 7.8 6.8 6.2  HGB 11.8* 10.3* 10.5*  HCT 37.2 32.1* 34.4*  PLT 247 225 248   Recent Labs  Lab 08/24/18 0820 08/25/18 0453  NA 139 140  K 3.6 3.5  CL 106 109  CO2 25 25  BUN 11 13  CREATININE 0.80 0.81  CALCIUM 8.8* 8.3*  PROT 6.6  --   BILITOT 0.7  --   ALKPHOS 79  --   ALT 12  --   AST 20  --   GLUCOSE 118* 104*    Imaging/Diagnostic Tests: Ct Abdomen Pelvis W Contrast  Result Date: 08/24/2018 CLINICAL DATA:  Abdominal pain, rectal bleeding EXAM: CT ABDOMEN AND PELVIS WITH CONTRAST TECHNIQUE: Multidetector CT imaging of the abdomen and pelvis was performed using the standard protocol following bolus administration of intravenous contrast. CONTRAST:  167m OMNIPAQUE IOHEXOL 300 MG/ML  SOLN COMPARISON:  12/10/2017 FINDINGS: Lower chest: Dependent atelectasis in the lung bases. Heart is borderline in size. No effusions. Hepatobiliary: 6 mm gallstone layering within the gallbladder. Diffuse fatty infiltration of the liver. No focal hepatic abnormality or biliary ductal dilatation. Pancreas: No focal abnormality or ductal dilatation. Spleen: No focal abnormality.  Normal size. Adrenals/Urinary Tract: No adrenal abnormality. No focal renal abnormality. No stones or hydronephrosis. Urinary bladder is unremarkable. Stomach/Bowel: Normal appendix. Stomach, large and small bowel grossly unremarkable. Postoperative changes in the stomach. Vascular/Lymphatic: No evidence of aneurysm or adenopathy. Reproductive: Prior hysterectomy.  No adnexal masses. Other: No free fluid or free air. Musculoskeletal: No acute bony abnormality. IMPRESSION: Cholelithiasis.  No CT evidence of cholecystitis. Fatty infiltration of the liver. Postoperative changes in the stomach. No acute bowel  abnormality visualized. Normal appendix. Bibasilar atelectasis. Electronically Signed   By: KRolm BaptiseM.D.   On: 08/24/2018 11:20    KWilber Oliphant MD 08/25/2018, 8:31 AM PGY-1, [place] pager: [phone] text pages welcome
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
## 2018-08-26 DIAGNOSIS — I10 Essential (primary) hypertension: Secondary | ICD-10-CM

## 2018-08-26 LAB — CBC
HCT: 32.9 % — ABNORMAL LOW (ref 36.0–46.0)
Hemoglobin: 10.1 g/dL — ABNORMAL LOW (ref 12.0–15.0)
MCH: 28.3 pg (ref 26.0–34.0)
MCHC: 30.7 g/dL (ref 30.0–36.0)
MCV: 92.2 fL (ref 80.0–100.0)
PLATELETS: 224 10*3/uL (ref 150–400)
RBC: 3.57 MIL/uL — ABNORMAL LOW (ref 3.87–5.11)
RDW: 14.3 % (ref 11.5–15.5)
WBC: 5.1 10*3/uL (ref 4.0–10.5)
nRBC: 0 % (ref 0.0–0.2)

## 2018-08-26 LAB — RPR: RPR Ser Ql: NONREACTIVE

## 2018-08-26 MED ORDER — PROPOFOL 1000 MG/100ML IV EMUL
INTRAVENOUS | Status: AC
  Filled 2018-08-26: qty 100

## 2018-08-26 MED ORDER — LABETALOL HCL 5 MG/ML IV SOLN
10.0000 mg | Freq: Once | INTRAVENOUS | Status: AC
  Administered 2018-08-26: 10 mg via INTRAVENOUS
  Filled 2018-08-26: qty 4

## 2018-08-26 MED ORDER — PROPOFOL 500 MG/50ML IV EMUL
INTRAVENOUS | Status: AC
  Filled 2018-08-26: qty 50

## 2018-08-26 MED ORDER — ONDANSETRON HCL 4 MG/2ML IJ SOLN: 4.0000 mg | Freq: Four times a day (QID) | INTRAMUSCULAR | Status: DC | PRN

## 2018-08-26 MED ORDER — PEG 3350-KCL-NA BICARB-NACL 420 G PO SOLR
4000.0000 mL | Freq: Once | ORAL | Status: AC
  Administered 2018-08-26: 4000 mL via ORAL
  Filled 2018-08-26: qty 4000

## 2018-08-26 NOTE — Progress Notes (Signed)
Patient'n BP this afternoon was 143/101 - automatic and 139/100 manual. Patient resting in bed; no complaints. MD was notified.

## 2018-08-26 NOTE — Progress Notes (Signed)
Completed bowel prep, but still having dark brown liquid stool.  Will need more prep, and will plan to do endoscopy and colonoscopy tomorrow morning.  Clear liquid today, npo after midnight.

## 2018-08-26 NOTE — Progress Notes (Signed)
Family Medicine Teaching Service Daily Progress Note Intern Pager: [phone]  Patient name: Sydney [name] Medical record number: [id number] Date of birth: 25-Oct-1970 Age: 47 y.o. Gender: female  Primary Care Provider: Carlena Hurl, PA-C Consultants: GI   Code Status: Full code  Pt Overview and Major Events to Date:  Admitted: 08/24/2018  Hospital Day: 3  11/23  Assessment and Plan: Sydney [name] a 47 y.o.femalepresenting with GI bleed. PMH is significant forGIST 2010, Tobacco Use Disorder, and HTN.  Bright Red Blood per Rectum & Abdominal Pain:  Hgb 10.1 on cbc this morning. INR/PTT normal. HR is wnl. Pt denies symptoms and feels better this morning. Had 3 episodes of bloody stools yesterday. Most recent stools most consistent with bowel prep. Differential remains broad. Diverticulitis with history of diverticulosis, ischemic colitis, IBD flare, gastric ulcer, lower GI bleed versus recurrence of GI ST as patient's oral chemo was ended early.  Additionally she has had unintentional weight loss in the past few months.   Per [facility], pt's prep inadequate. Clears today and plans for scopes tomorrow morning.   AM CBC  Continue IV PTX   Hypertension Does not take Norvasc at home as prescribed. BP's in last 24 hours [phone].   549 diastolic concerning. Will continuing to monitor pressures  Labetalol as needed for BP >826 systolic  Holding Norvasc while NPO  Tobacco Use Disorder: 10-pack-year history   21 mg nicotine patch   Electrolytes: Replete PRN Nutrition: Clears  GI ppx: IV PPT DVT ppx: SCD  Future labs: AM CBC  Disposition: Stay on med surg today. Possible plans for home after scopes if normal and w/o need for intervention.  Medications: Scheduled Meds: . nicotine  21 mg Transdermal Daily  . pantoprazole (PROTONIX) IV  40 mg Intravenous QHS  . polyethylene glycol-electrolytes  4,000 mL Oral Once    Subjective  Patient is doing  well this morning. Has no abdominal pain and no other complaints this morning. Pt reports 3 bloody BM yesterday with looser brown stools overnight.   Objective:   Vital Signs Temp:  [97.9 F (36.6 C)-98.5 F (36.9 C)] 97.9 F (36.6 C) (11/22 0832) Pulse Rate:  [63-82] 63 (11/22 0527) Resp:  [14-18] 16 (11/22 0527) BP: (127-146)/(96-117) 127/96 (11/22 0527) SpO2:  [94 %-100 %] 100 % (11/22 0527)  Intake/Output No intake or output data in the 24 hours ending 08/26/18 1048 Intake/Output      11/21 0701 - 11/22 0700 11/22 0701 - 11/23 0700   IV Piggyback 100    Total Intake(mL/kg) 100 (1.3)    Net +100           Physical Exam Gen: NAD, alert, non-toxic, well-appearing, sitting comfortably  Skin: Warm and dry HEENT: NCAT.  MMM.  CV: RRR.  Normal S1-S2. No BLEE. Resp: CTAB. No increased WOB Abd: NTND on palpation to all 4 quadrants.  Pos bowel sounds Extremities: moves extremities spontaneously. Warm and well perfused.   Laboratory: Recent Labs  Lab 08/25/18 0036 08/25/18 0453 08/25/18 1225 08/26/18 0638  WBC 6.8 6.2  --  5.1  HGB 10.3* 10.5* 10.6* 10.1*  HCT 32.1* 34.4* 33.7* 32.9*  PLT 225 248  --  224   Recent Labs  Lab 08/24/18 0820 08/25/18 0453  NA 139 140  K 3.6 3.5  CL 106 109  CO2 25 25  BUN 11 13  CREATININE 0.80 0.81  CALCIUM 8.8* 8.3*  PROT 6.6  --   BILITOT 0.7  --  ALKPHOS 79  --   ALT 12  --   AST 20  --   GLUCOSE 118* 104*    Imaging/Diagnostic Tests: Ct Abdomen Pelvis W Contrast  Result Date: 08/24/2018 CLINICAL DATA:  Abdominal pain, rectal bleeding EXAM: CT ABDOMEN AND PELVIS WITH CONTRAST TECHNIQUE: Multidetector CT imaging of the abdomen and pelvis was performed using the standard protocol following bolus administration of intravenous contrast. CONTRAST:  170m OMNIPAQUE IOHEXOL 300 MG/ML  SOLN COMPARISON:  12/10/2017 FINDINGS: Lower chest: Dependent atelectasis in the lung bases. Heart is borderline in size. No effusions.  Hepatobiliary: 6 mm gallstone layering within the gallbladder. Diffuse fatty infiltration of the liver. No focal hepatic abnormality or biliary ductal dilatation. Pancreas: No focal abnormality or ductal dilatation. Spleen: No focal abnormality.  Normal size. Adrenals/Urinary Tract: No adrenal abnormality. No focal renal abnormality. No stones or hydronephrosis. Urinary bladder is unremarkable. Stomach/Bowel: Normal appendix. Stomach, large and small bowel grossly unremarkable. Postoperative changes in the stomach. Vascular/Lymphatic: No evidence of aneurysm or adenopathy. Reproductive: Prior hysterectomy.  No adnexal masses. Other: No free fluid or free air. Musculoskeletal: No acute bony abnormality. IMPRESSION: Cholelithiasis.  No CT evidence of cholecystitis. Fatty infiltration of the liver. Postoperative changes in the stomach. No acute bowel abnormality visualized. Normal appendix. Bibasilar atelectasis. Electronically Signed   By: KRolm BaptiseM.D.   On: 08/24/2018 11:20      KWilber Oliphant MD 08/26/2018, 10:48 AM PGY-1, [place] pager: [phone] text pages welcome
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
## 2018-08-27 ENCOUNTER — Inpatient Hospital Stay (HOSPITAL_COMMUNITY): Payer: 59 | Admitting: Certified Registered"

## 2018-08-27 ENCOUNTER — Encounter (HOSPITAL_COMMUNITY): Payer: Self-pay | Admitting: Gastroenterology

## 2018-08-27 ENCOUNTER — Encounter (HOSPITAL_COMMUNITY)
Admission: EM | Disposition: A | Discharge status: Home / Self Care | Payer: Self-pay | Source: Home / Self Care | Attending: Family Medicine

## 2018-08-27 HISTORY — PX: BIOPSY: SHX5522

## 2018-08-27 HISTORY — PX: COLONOSCOPY WITH PROPOFOL: SHX5780

## 2018-08-27 HISTORY — PX: ESOPHAGOGASTRODUODENOSCOPY (EGD) WITH PROPOFOL: SHX5813

## 2018-08-27 LAB — CBC WITH DIFFERENTIAL/PLATELET
Abs Immature Granulocytes: 0.01 10*3/uL (ref 0.00–0.07)
BASOS ABS: 0 10*3/uL (ref 0.0–0.1)
Basophils Relative: 1 %
EOS PCT: 2 %
Eosinophils Absolute: 0.1 10*3/uL (ref 0.0–0.5)
HCT: 32.4 % — ABNORMAL LOW (ref 36.0–46.0)
HEMOGLOBIN: 10.4 g/dL — AB (ref 12.0–15.0)
IMMATURE GRANULOCYTES: 0 %
LYMPHS ABS: 2.5 10*3/uL (ref 0.7–4.0)
LYMPHS PCT: 45 %
MCH: 29.4 pg (ref 26.0–34.0)
MCHC: 32.1 g/dL (ref 30.0–36.0)
MCV: 91.5 fL (ref 80.0–100.0)
Monocytes Absolute: 0.4 10*3/uL (ref 0.1–1.0)
Monocytes Relative: 6 %
NEUTROS ABS: 2.7 10*3/uL (ref 1.7–7.7)
NEUTROS PCT: 46 %
NRBC: 0 % (ref 0.0–0.2)
Platelets: 221 10*3/uL (ref 150–400)
RBC: 3.54 MIL/uL — ABNORMAL LOW (ref 3.87–5.11)
RDW: 14.3 % (ref 11.5–15.5)
WBC: 5.6 10*3/uL (ref 4.0–10.5)

## 2018-08-27 SURGERY — ESOPHAGOGASTRODUODENOSCOPY (EGD) WITH PROPOFOL
Anesthesia: Monitor Anesthesia Care | Laterality: Left

## 2018-08-27 MED ORDER — ACETAMINOPHEN 325 MG PO TABS
650.0000 mg | ORAL_TABLET | Freq: Four times a day (QID) | ORAL | Status: DC | PRN
  Administered 2018-08-27 (×2): 650 mg via ORAL
  Filled 2018-08-27 (×2): qty 2

## 2018-08-27 MED ORDER — METRONIDAZOLE 0.75 % VA GEL
1.0000 | Freq: Every day | VAGINAL | Status: DC
  Administered 2018-08-27: 1 via VAGINAL
  Filled 2018-08-27: qty 70

## 2018-08-27 MED ORDER — AMLODIPINE BESYLATE 5 MG PO TABS
5.0000 mg | ORAL_TABLET | Freq: Every day | ORAL | Status: DC
  Administered 2018-08-27 – 2018-08-28 (×2): 5 mg via ORAL
  Filled 2018-08-27 (×2): qty 1

## 2018-08-27 MED ORDER — PROPOFOL 10 MG/ML IV BOLUS
INTRAVENOUS | Status: DC | PRN
  Administered 2018-08-27 (×2): 20 mg via INTRAVENOUS
  Administered 2018-08-27: 40 mg via INTRAVENOUS

## 2018-08-27 MED ORDER — PROPOFOL 500 MG/50ML IV EMUL
INTRAVENOUS | Status: DC | PRN
  Administered 2018-08-27: 125 ug/kg/min via INTRAVENOUS

## 2018-08-27 MED ORDER — LACTATED RINGERS IV SOLN
INTRAVENOUS | Status: DC
  Administered 2018-08-27: 13:00:00 via INTRAVENOUS

## 2018-08-27 SURGICAL SUPPLY — 24 items

## 2018-08-27 NOTE — Anesthesia Postprocedure Evaluation (Signed)
Anesthesia Post Note  Patient: Sydney [name]  Procedure(s) Performed: ESOPHAGOGASTRODUODENOSCOPY (EGD) WITH PROPOFOL (Left ) COLONOSCOPY WITH PROPOFOL (Left ) BIOPSY     Patient location during evaluation: Endoscopy Anesthesia Type: MAC Level of consciousness: awake and alert, oriented and awake Pain management: pain level controlled Vital Signs Assessment: post-procedure vital signs reviewed and stable Respiratory status: spontaneous breathing, nonlabored ventilation and respiratory function stable Cardiovascular status: stable and blood pressure returned to baseline Postop Assessment: no apparent nausea or vomiting Anesthetic complications: no    Last Vitals:  Vitals:   08/27/18 1416 08/27/18 1438  BP: (!) 160/106 (!) 151/109  Pulse: 76 76  Resp: (!) 24 20  Temp:  37.4 C  SpO2: 100% 93%    Last Pain:  Vitals:   08/27/18 1549  TempSrc:   PainSc: 0-No pain                 Catalina Gravel

## 2018-08-27 NOTE — Anesthesia Preprocedure Evaluation (Addendum)
Anesthesia Evaluation  Patient identified by MRN, date of birth, ID band Patient awake    Reviewed: Allergy & Precautions, NPO status , Patient's Chart, lab work & pertinent test results  Airway Mallampati: II  TM Distance: >3 FB Neck ROM: Full    Dental  (+) Teeth Intact, Dental Advisory Given   Pulmonary Current Smoker,    Pulmonary exam normal breath sounds clear to auscultation       Cardiovascular hypertension, Pt. on medications Normal cardiovascular exam Rhythm:Regular Rate:Normal     Neuro/Psych PSYCHIATRIC DISORDERS Anxiety Depression Left eye blindness   Neuromuscular disease    GI/Hepatic Neg liver ROS, Malignant Gastric stromal tumor s/p chemo, surgery   Endo/Other  negative endocrine ROS  Renal/GU negative Renal ROS     Musculoskeletal  (+) Arthritis ,   Abdominal   Peds  Hematology  (+) Blood dyscrasia, anemia ,   Anesthesia Other Findings Day of surgery medications reviewed with the patient.  Reproductive/Obstetrics                           Anesthesia Physical Anesthesia Plan  ASA: II  Anesthesia Plan: MAC   Post-op Pain Management:    Induction: Intravenous  PONV Risk Score and Plan: 1 and Propofol infusion and Treatment may vary due to age or medical condition  Airway Management Planned: Natural Airway and Nasal Cannula  Additional Equipment:   Intra-op Plan:   Post-operative Plan:   Informed Consent: I have reviewed the patients History and Physical, chart, labs and discussed the procedure including the risks, benefits and alternatives for the proposed anesthesia with the patient or authorized representative who has indicated his/her understanding and acceptance.   Dental advisory given  Plan Discussed with: CRNA  Anesthesia Plan Comments:         Anesthesia Quick Evaluation

## 2018-08-27 NOTE — Interval H&P Note (Signed)
History and Physical Interval Note:  08/27/2018 1:25 PM  OMBTDHRCB Sydney [name]  has presented today for surgery, with the diagnosis of upper abdominal pain, anemia, blood in stool  The various methods of treatment have been discussed with the patient and family. After consideration of risks, benefits and other options for treatment, the patient has consented to  Procedure(s): ESOPHAGOGASTRODUODENOSCOPY (EGD) WITH PROPOFOL (Left) COLONOSCOPY WITH PROPOFOL (Left) as a surgical intervention .  The patient's history has been reviewed, patient examined, no change in status, stable for surgery.  I have reviewed the patient's chart and labs.  Questions were answered to the patient's satisfaction.     Youlanda Mighty Sydney [name]

## 2018-08-27 NOTE — Op Note (Signed)
[facility] Patient Name: Sydney [name] Procedure Date : 08/27/2018 MRN: [id number] Attending MD: Ronald Lobo , MD Date of Birth: 07-08-1971 CSN: [id number] Age: 47 Admit Type: Inpatient Procedure:                Colonoscopy Indications:              Heme positive stool, normocytic anemia, history of                            GIST tumor Providers:                Ronald Lobo, MD, Angus Seller, Laverda Sorenson,                            Technician, Lance Coon, CRNA Referring MD:              Medicines:                Monitored Anesthesia Care Complications:            No immediate complications. Estimated Blood Loss:     Estimated blood loss: none. Procedure:                Pre-Anesthesia Assessment:                           - Prior to the procedure, a History and Physical                            was performed, and patient medications and                            allergies were reviewed. The patient's tolerance of                            previous anesthesia was also reviewed. The risks                            and benefits of the procedure and the sedation                            options and risks were discussed with the patient.                            All questions were answered, and informed consent                            was obtained. Prior Anticoagulants: The patient has                            taken no previous anticoagulant or antiplatelet                            agents. ASA Grade Assessment: II - A patient with  mild systemic disease. After reviewing the risks                            and benefits, the patient was deemed in                            satisfactory condition to undergo the procedure.                           After obtaining informed consent, the colonoscope                            was passed under direct vision. Throughout the                            procedure, the  patient's blood pressure, pulse, and                            oxygen saturations were monitored continuously. The                            CF-HQ190L ([id number]) Olympus adult colon was                            introduced through the anus and advanced to the the                            terminal ileum. The colonoscopy was somewhat                            difficult due to restricted mobility of the colon                            and significant looping. Successful completion of                            the procedure was aided by straightening and                            shortening the scope to obtain bowel loop                            reduction. The patient tolerated the procedure                            well. The quality of the bowel preparation was                            excellent. Scope In: 1:44:24 PM Scope Out: 1:58:39 PM Scope Withdrawal Time: 0 hours 7 minutes 5 seconds  Total Procedure Duration: 0 hours 14 minutes 15 seconds  Findings:      The perianal and digital rectal examinations were normal.      A few medium-mouthed diverticula were found in the proximal transverse  colon.      An area of mildly friable (with contact bleeding) mucosa was found in       the entire colon.      No other significant abnormalities were identified in a careful       examination of the remainder of the colon.      There is no endoscopic evidence of inflammation, mass or polyps in the       entire colon.      The terminal ileum appeared normal. Impression:               - Diverticulosis in the proximal transverse colon.                           Dionisio David (with contact bleeding) mucosa in the                            entire examined colon.                           - The examined portion of the ileum was normal.                           - No specimens collected. Recommendation:           - To visualize the small bowel, perform video                             capsule endoscopy tomorrow. Procedure Code(s):        --- Professional ---                           [phone], Colonoscopy, flexible; diagnostic, including                            collection of specimen(s) by brushing or washing,                            when performed (separate procedure) Diagnosis Code(s):        --- Professional ---                           K92.2, Gastrointestinal hemorrhage, unspecified                           R19.5, Other fecal abnormalities CPT copyright 2018 American Medical Association. All rights reserved. The codes documented in this report are preliminary and upon coder review may  be revised to meet current compliance requirements. Ronald Lobo, MD 08/27/2018 2:13:41 PM This report has been signed electronically. Number of Addenda: 0

## 2018-08-27 NOTE — Op Note (Signed)
[facility] Patient Name: Sydney [name] Procedure Date : 08/27/2018 MRN: [id number] Attending MD: Ronald Lobo , MD Date of Birth: [birth year] CSN: [id number] Age: 47 Admit Type: Inpatient Procedure:                Upper GI endoscopy Indications:              Epigastric abdominal pain, Heme positive stool,                            normocytic anemia Providers:                Ronald Lobo, MD, Angus Seller, Laverda Sorenson,                            Technician, Lance Coon, CRNA Referring MD:              Medicines:                Monitored Anesthesia Care Complications:            No immediate complications. Estimated Blood Loss:     Estimated blood loss was minimal. Procedure:                Pre-Anesthesia Assessment:                           - Prior to the procedure, a History and Physical                            was performed, and patient medications and                            allergies were reviewed. The patient's tolerance of                            previous anesthesia was also reviewed. The risks                            and benefits of the procedure and the sedation                            options and risks were discussed with the patient.                            All questions were answered, and informed consent                            was obtained. Prior Anticoagulants: The patient has                            taken no previous anticoagulant or antiplatelet                            agents. ASA Grade Assessment: II - A patient with  mild systemic disease. After reviewing the risks                            and benefits, the patient was deemed in                            satisfactory condition to undergo the procedure.                           After obtaining informed consent, the endoscope was                            passed under direct vision. Throughout the   procedure, the patient's blood pressure, pulse, and                            oxygen saturations were monitored continuously. The                            [id number]-[id number] ([id number]) Olympus Adult EGD was introduced                            through the mouth, and advanced to the second part                            of duodenum. The upper GI endoscopy was                            accomplished without difficulty. The patient                            tolerated the procedure well. Scope In: Scope Out: Findings:      The examined esophagus was normal.      The Z-line was irregular but I do not thing Barrett's changes were       present.      Diffuse mildly punctate erythematous mucosa without bleeding was found       in the gastric antrum. Biopsies were taken with a cold forceps for       histology.      The exam of the stomach was otherwise normal.      The cardia and gastric fundus were normal on retroflexion.      There is no endoscopic evidence of ulceration, angiodysplasia or mass in       the entire examined stomach.      The examined duodenum was normal. Biopsies were taken with a cold       forceps for histology.      A 2 cm hiatal hernia was present. Impression:               - No source of heme positive stool, anemia, or                            epigastric pain endoscopically evident.                           - Normal  esophagus.                           - Z-line irregular.                           - Erythematous mucosa in the antrum. Biopsied.                           - Normal examined duodenum. Biopsied.                           - 2 cm hiatal hernia. Recommendation:           - Await pathology results.                           - Perform a colonoscopy today. Procedure Code(s):        --- Professional ---                           [phone], Esophagogastroduodenoscopy, flexible,                            transoral; with biopsy, single or multiple Diagnosis Code(s):         --- Professional ---                           K22.8, Other specified diseases of esophagus                           K31.89, Other diseases of stomach and duodenum                           R10.13, Epigastric pain                           R19.5, Other fecal abnormalities CPT copyright 2018 American Medical Association. All rights reserved. The codes documented in this report are preliminary and upon coder review may  be revised to meet current compliance requirements. Ronald Lobo, MD 08/27/2018 2:10:06 PM This report has been signed electronically. Number of Addenda: 0

## 2018-08-27 NOTE — Transfer of Care (Signed)
Immediate Anesthesia Transfer of Care Note  Patient: Sydney [name]  Procedure(s) Performed: ESOPHAGOGASTRODUODENOSCOPY (EGD) WITH PROPOFOL (Left ) COLONOSCOPY WITH PROPOFOL (Left ) BIOPSY  Patient Location: Endoscopy Unit  Anesthesia Type:MAC  Level of Consciousness: drowsy and patient cooperative  Airway & Oxygen Therapy: Patient Spontanous Breathing  Post-op Assessment: Report given to RN and Post -op Vital signs reviewed and stable  Post vital signs: Reviewed and stable  Last Vitals:  Vitals Value Taken Time  BP    Temp    Pulse    Resp 20 08/27/2018  2:04 PM  SpO2    Vitals shown include unvalidated device data.  Last Pain:  Vitals:   08/27/18 1230  TempSrc: Oral  PainSc: 0-No pain      Patients Stated Pain Goal: 0 (43/[id number])  Complications: No apparent anesthesia complications

## 2018-08-27 NOTE — Progress Notes (Signed)
Endoscopy and colonoscopy were unrevealing for source of abdominal pain, heme positive stool, or anemia.    Capsule endoscopy has been scheduled for tomorrow, and I have reviewed the nature, purpose, and risks of the procedure with the patient who is agreeable.  Diet orders written.  Cleotis Nipper, M.D. Pager [phone] If no answer or after 5 PM call [phone]

## 2018-08-27 NOTE — Anesthesia Procedure Notes (Signed)
Procedure Name: MAC Date/Time: 08/27/2018 1:29 PM Performed by: Lance Coon, CRNA Pre-anesthesia Checklist: Patient identified, Emergency Drugs available, Suction available, Patient being monitored and Timeout performed Patient Re-evaluated:Patient Re-evaluated prior to induction Oxygen Delivery Method: Nasal cannula

## 2018-08-27 NOTE — Progress Notes (Addendum)
Family Medicine Teaching Service Daily Progress Note Intern Pager: [phone]  Patient name: Sydney [name] Medical record number: [id number] Date of birth: 03-Jun-1971 Age: 47 y.o. Gender: female  Primary Care Provider: Carlena Hurl, PA-C Consultants: GI   Code Status: Full code  Pt Overview and Major Events to Date:  Admitted: 08/24/2018  Hospital Day: 4  11/23  Assessment and Plan: Sydney [name] a 47 y.o.femalepresenting with GI bleed. PMH is significant forGIST 2010, Tobacco Use Disorder, and HTN.  Bright Red Blood per Rectum & Abdominal Pain: Improving, stable.  No abdominal pain this am, no further BRBPR since 11/21. Hgb stable, 10.4 this am. Differential could continue to include hemorrhoids, diverticulitis, ischemic colitis, IBD, gastric/duodenal ulcer, vs recurrence of GIST. S/p bowel prep this morning.  -GI following, plan for endoscopy/colonoscopy this morning -F/u scope results this afternoon -Trend Hgb, CBC in am  -Cont IV protonix   Hypertension: Unchanged.  Previously on Norvasc, no PCP. Max 164/119 overnight. Received one dose of IV labetalol 32m.  -Continue to monitor BP -Restart Norvasc  -IV labetalol PRN SBP> 180, DBP >110  -Consult to care management for PCP, appropriate acute BPBPR and BP follow up   Vaginal discharge/STI testing: Stable.  STI testing per patient request.  HIV and RPR non-reactive.  -F/u GC/chlamydia-needs to be collected  -Treat for BV at d/c   Headache: New onset, stable.  Started overnight, aching pain. Patient feels it is likely caffeine withdrawal, has not had coffee since Tuesday. No alarm symptoms, no neuro deficits on exam.  -Tylenol PRN -Could consider Headache cocktail if worsens or unchanged following procedure   Tobacco Use Disorder:Stable.  10-pack-year history  -21 mg nicotine patch   Electrolytes: Replete PRN Nutrition: NPO, regular diet following procedure this am  GI ppx: IV PPT DVT ppx:  SCD  Disposition: GI procedure this am, disposition pending results   Medications: Scheduled Meds: . nicotine  21 mg Transdermal Daily  . pantoprazole (PROTONIX) IV  40 mg Intravenous QHS    Subjective  Patient is doing well this morning, only complains of aching non-specific headache that she believes is likely from caffeine withdrawal (she often has a similar HA when she doesn't have coffee). Denies any abdominal pain, nausea, or vomiting. No bloody BM's since 11/21.   Objective:   Vital Signs Temp:  [97.9 F (36.6 C)-98.9 F (37.2 C)] 98.4 F (36.9 C) (11/23 0415) Pulse Rate:  [70-82] 70 (11/23 0415) Resp:  [14-18] 14 (11/23 0415) BP: (133-164)/(95-119) 135/95 (11/23 0415) SpO2:  [95 %-97 %] 97 % (11/23 0415)  Intake/Output  Intake/Output Summary (Last 24 hours) at 08/27/2018 0746 Last data filed at 08/26/2018 1000 Gross per 24 hour  Intake 0 ml  Output -  Net 0 ml   Intake/Output      11/22 0701 - 11/23 0700 11/23 0701 - 11/24 0700   P.O. 0    IV Piggyback     Total Intake(mL/kg) 0 (0)    Net 0           Physical Exam General: Alert, NAD, awaking up from sleep  HEENT: NCAT, MMM, oropharynx nonerythematous  Cardiac: RRR no m/g/r Lungs: Clear bilaterally, no increased WOB  Abdomen: soft, non-tender, non-distended, normoactive BS Msk: Moves all extremities spontaneously  Ext: Warm, dry, 2+ distal pulses, no edema   Laboratory: Recent Labs  Lab 08/25/18 0453 08/25/18 1225 08/26/18 0638 08/27/18 0354  WBC 6.2  --  5.1 5.6  HGB 10.5* 10.6* 10.1* 10.4*  HCT 34.4* 33.7* 32.9* 32.4*  PLT 248  --  224 221   Recent Labs  Lab 08/24/18 0820 08/25/18 0453  NA 139 140  K 3.6 3.5  CL 106 109  CO2 25 25  BUN 11 13  CREATININE 0.80 0.81  CALCIUM 8.8* 8.3*  PROT 6.6  --   BILITOT 0.7  --   ALKPHOS 79  --   ALT 12  --   AST 20  --   GLUCOSE 118* 104*    Imaging/Diagnostic Tests: No results found.    Patriciaann Clan, DO 08/27/2018, 7:46  AM PGY-1, [facility] Intern pager: [phone], text pages welcome

## 2018-08-28 ENCOUNTER — Encounter (HOSPITAL_COMMUNITY)
Admission: EM | Disposition: A | Discharge status: Home / Self Care | Payer: Self-pay | Source: Home / Self Care | Attending: Family Medicine

## 2018-08-28 DIAGNOSIS — D500 Iron deficiency anemia secondary to blood loss (chronic): Secondary | ICD-10-CM

## 2018-08-28 DIAGNOSIS — N898 Other specified noninflammatory disorders of vagina: Secondary | ICD-10-CM

## 2018-08-28 DIAGNOSIS — N760 Acute vaginitis: Secondary | ICD-10-CM

## 2018-08-28 DIAGNOSIS — B9689 Other specified bacterial agents as the cause of diseases classified elsewhere: Secondary | ICD-10-CM

## 2018-08-28 HISTORY — PX: GIVENS CAPSULE STUDY: SHX5432

## 2018-08-28 LAB — RETICULOCYTES
Immature Retic Fract: 13.7 % (ref 2.3–15.9)
RBC.: 3.58 MIL/uL — AB (ref 3.87–5.11)
RETIC CT PCT: 2.6 % (ref 0.4–3.1)
Retic Count, Absolute: 93.1 10*3/uL (ref 19.0–186.0)

## 2018-08-28 LAB — BASIC METABOLIC PANEL
Anion gap: 7 (ref 5–15)
CO2: 26 mmol/L (ref 22–32)
Calcium: 8.8 mg/dL — ABNORMAL LOW (ref 8.9–10.3)
Chloride: 106 mmol/L (ref 98–111)
Creatinine, Ser: 0.82 mg/dL (ref 0.44–1.00)
GFR calc Af Amer: >60 mL/min (ref >60)
GFR calc non Af Amer: >60 mL/min (ref >60)
GLUCOSE: 94 mg/dL (ref 70–99)
POTASSIUM: 3.5 mmol/L (ref 3.5–5.1)
Sodium: 139 mmol/L (ref 135–145)

## 2018-08-28 LAB — IRON AND TIBC
Iron: 21 ug/dL — ABNORMAL LOW (ref 28–170)
Saturation Ratios: 7 % — ABNORMAL LOW (ref 10.4–31.8)
TIBC: 321 ug/dL (ref 250–450)
UIBC: 300 ug/dL

## 2018-08-28 LAB — FOLATE: Folate: 9.4 ng/mL (ref >5.9)

## 2018-08-28 LAB — CBC
HCT: 32.8 % — ABNORMAL LOW (ref 36.0–46.0)
HEMOGLOBIN: 10.5 g/dL — AB (ref 12.0–15.0)
MCH: 29.3 pg (ref 26.0–34.0)
MCHC: 32 g/dL (ref 30.0–36.0)
MCV: 91.6 fL (ref 80.0–100.0)
Platelets: 244 10*3/uL (ref 150–400)
RBC: 3.58 MIL/uL — AB (ref 3.87–5.11)
RDW: 14.2 % (ref 11.5–15.5)
WBC: 5.8 10*3/uL (ref 4.0–10.5)
nRBC: 0 % (ref 0.0–0.2)

## 2018-08-28 LAB — FERRITIN: FERRITIN: 9 ng/mL — AB (ref 11–307)

## 2018-08-28 LAB — VITAMIN B12: VITAMIN B 12: 426 pg/mL (ref 180–914)

## 2018-08-28 SURGERY — IMAGING PROCEDURE, GI TRACT, INTRALUMINAL, VIA CAPSULE
Anesthesia: LOCAL

## 2018-08-28 MED ORDER — METRONIDAZOLE 0.75 % VA GEL: 1.0000 | Freq: Every day | VAGINAL | 0 refills | Status: DC

## 2018-08-28 MED ORDER — PANTOPRAZOLE SODIUM 40 MG PO TBEC: 40.0000 mg | DELAYED_RELEASE_TABLET | Freq: Every day | ORAL | 1 refills | Status: DC

## 2018-08-28 MED ORDER — NICOTINE 21 MG/24HR TD PT24: 21.0000 mg | MEDICATED_PATCH | Freq: Every day | TRANSDERMAL | 0 refills | Status: DC

## 2018-08-28 MED ORDER — ACETAMINOPHEN 325 MG PO TABS: 650.0000 mg | ORAL_TABLET | Freq: Four times a day (QID) | ORAL | 0 refills | Status: DC | PRN

## 2018-08-28 SURGICAL SUPPLY — 1 items: TOWEL COTTON PACK 4EA (MISCELLANEOUS) ×4 IMPLANT

## 2018-08-28 NOTE — Progress Notes (Signed)
Family Medicine Teaching Service Daily Progress Note Intern Pager: [phone]  Patient name: Sydney [name] Medical record number: [id number] Date of birth: Apr 26, 1971 Age: 47 y.o. Gender: female  Primary Care Provider: Carlena Hurl, PA-C Consultants: GI     Consult Orders  (From admission, onward)         Start     Ordered   08/27/18 [phone]  Consult to care management  Once    Comments:  Needs PCP  Provider:  (Not yet assigned)  Question:  Reason for consult:  Answer:  Other (see comments)   08/27/18 0811          Code Status: Full code  Pt Overview and Major Events to Date:  Admitted: 08/24/2018  Hospital Day: 5  11/23  Assessment and Plan: JEARLENE BRIDWELL a 47 y.o.femalepresenting with GI bleed. PMH is significant forGIST 2010, Tobacco Use Disorder, and HTN.  Bright Red Blood per Rectum & Abdominal Pain: Pain reduced to mild cramping, bleeding stopped since 11/21. Hgb 11/24 10.5. Differential could continue to include hemorrhoids, diverticulitis, ischemic colitis, IBD, gastric/duodenal ulcer, vs recurrence of GIST. S/p bowel prep this morning.  -GI following, plan for capsule endoscopy 11/24 -Trend Hgb, CBC daily -Cont IV protonix  -npo sips/w meds pending capsule endoscopy  Hypertension: Unchanged with exception of 1 recorded wnl am 11/24 Previously on Norvasc, no PCP. Max 164/119 overnight. Received one dose of IV labetalol 34m.  -Continue to monitor BP -continue Norvasc 5 -IV labetalol PRN SBP> 180, DBP >110  -Consult to care management for PCP, appropriate acute BPBPR and BP follow up   Vaginal discharge/STI testing: Stable.  STI testing per patient request. RPR/NIV neg/NR.   -F/u GC/chlamydia-pending -cont metro gel for suspected BV  Headache: resolved -Tylenol PRN  Tobacco Use Disorder:Stable.  10-pack-year history  -21 mg nicotine patch (also added to d/c meds)  Electrolytes: Replete PRN Nutrition: NPO, regular diet following  procedure this am  GI ppx: IV PPT DVT ppx: SCD  Disposition: GI procedure this am, disposition pending results   Medications: Scheduled Meds: . amLODipine  5 mg Oral Daily  . metroNIDAZOLE  1 Applicatorful Vaginal QHS  . nicotine  21 mg Transdermal Daily  . pantoprazole (PROTONIX) IV  40 mg Intravenous QHS    Subjective  Patient comfortable, actually described self as bored.  BM right before exam with no bleeding  Objective:   Vital Signs Temp:  [98.1 F (36.7 C)-99.4 F (37.4 C)] 98.1 F (36.7 C) (11/23 2136) Pulse Rate:  [65-91] 81 (11/23 2136) Resp:  [10-24] 20 (11/23 1438) BP: (117-176)/(85-109) 117/85 (11/23 2136) SpO2:  [93 %-100 %] 99 % (11/23 2136) Weight:  [79.8 kg] 79.8 kg (11/23 1230)  Intake/Output  Intake/Output Summary (Last 24 hours) at 08/28/2018 0453 Last data filed at 08/27/2018 1451 Gross per 24 hour  Intake 300 ml  Output 300 ml  Net 0 ml   Intake/Output      11/23 0701 - 11/24 0700   I.V. (mL/kg) 300 (3.8)   Total Intake(mL/kg) 300 (3.8)   Urine (mL/kg/hr) 300 (0.2)   Blood 0   Total Output 300   Net 0         Physical Exam General: alert, pleasant, comfortable Cardiac: rrr, no murmurs Lungs: good air movement diffusely, no wheezes/crackles Abdomen: soft, no TTP, no rebound/guarding Msk: ambulates with no difficulty Ext: warm/dry, no deficits noted  Laboratory: Recent Labs  Lab 08/25/18 0453 08/25/18 1225 08/26/18 0638 08/27/18 0354  WBC 6.2  --  5.1 5.6  HGB 10.5* 10.6* 10.1* 10.4*  HCT 34.4* 33.7* 32.9* 32.4*  PLT 248  --  224 221   Recent Labs  Lab 08/24/18 0820 08/25/18 0453  NA 139 140  K 3.6 3.5  CL 106 109  CO2 25 25  BUN 11 13  CREATININE 0.80 0.81  CALCIUM 8.8* 8.3*  PROT 6.6  --   BILITOT 0.7  --   ALKPHOS 79  --   ALT 12  --   AST 20  --   GLUCOSE 118* 104*    Imaging/Diagnostic Tests: No results found.    Sherene Sires, DO 08/28/2018, 4:53 AM PGY-2, [facility] Intern  pager: [phone], text pages welcome

## 2018-08-28 NOTE — Progress Notes (Signed)
Pt ingested capsule at 0800 without difficulty. Pt educated about capsule study and verbalized understanding.

## 2018-08-28 NOTE — Plan of Care (Signed)
  Problem: Education: Goal: Knowledge of General Education information will improve Description Including pain rating scale, medication(s)/side effects and non-pharmacologic comfort measures Outcome: Progressing   Problem: Clinical Measurements: Goal: Ability to maintain clinical measurements within normal limits will improve Outcome: Progressing Goal: Diagnostic test results will improve Outcome: Progressing   Problem: Clinical Measurements: Goal: Diagnostic test results will improve Outcome: Progressing   Problem: Nutrition: Goal: Adequate nutrition will be maintained Outcome: Progressing   Problem: Coping: Goal: Level of anxiety will decrease Outcome: Progressing   Problem: Elimination: Goal: Will not experience complications related to bowel motility Outcome: Progressing   Problem: Education: Goal: Ability to identify signs and symptoms of gastrointestinal bleeding will improve Outcome: Progressing   Problem: Bowel/Gastric: Goal: Will show no signs and symptoms of gastrointestinal bleeding Outcome: Progressing   Problem: Fluid Volume: Goal: Will show no signs and symptoms of excessive bleeding Outcome: Progressing   Problem: Clinical Measurements: Goal: Complications related to the disease process, condition or treatment will be avoided or minimized Outcome: Progressing

## 2018-08-28 NOTE — Plan of Care (Signed)
Nsg Discharge Note  Admit Date:  08/24/2018 Discharge date: 08/28/2018   MARCHELL FROMAN to be D/C'd Home per MD order.  AVS completed.  Copy for chart, and copy for patient signed, and dated. Patient/caregiver able to verbalize understanding.  Discharge Medication: Allergies as of 08/28/2018   No Known Allergies      Medication List     STOP taking these medications    amoxicillin-clavulanate 875-125 MG tablet Commonly known as:  AUGMENTIN   BC HEADACHE POWDER PO   benzonatate 200 MG capsule Commonly known as:  TESSALON       TAKE these medications    acetaminophen 325 MG tablet Commonly known as:  TYLENOL Take 2 tablets (650 mg total) by mouth every 6 (six) hours as needed for headache.   amLODipine 10 MG tablet Commonly known as:  NORVASC Take 1 tablet (10 mg total) by mouth daily.   metroNIDAZOLE 0.75 % vaginal gel Commonly known as:  METROGEL Place 1 Applicatorful vaginally at bedtime.   nicotine 21 mg/24hr patch Commonly known as:  NICODERM CQ - dosed in mg/24 hours Place 1 patch (21 mg total) onto the skin daily.   pantoprazole 40 MG tablet Commonly known as:  PROTONIX Take 1 tablet (40 mg total) by mouth daily.        Discharge Assessment: Vitals:   08/28/18 0606 08/28/18 0637  BP: (!) 136/101 (!) 141/105  Pulse: 65 68  Resp:    Temp: 97.8 F (36.6 C)   SpO2: 99%    Skin clean, dry and intact without evidence of skin break down, no evidence of skin tears noted. IV catheter discontinued intact. Site without signs and symptoms of complications - no redness or edema noted at insertion site, patient denies c/o pain - only slight tenderness at site.  Dressing with slight pressure applied.  D/c Instructions-Education: Discharge instructions given to patient/family with verbalized understanding. D/c education completed with patient/family including follow up instructions, medication list, d/c activities limitations if indicated, with other d/c  instructions as indicated by MD - patient able to verbalize understanding, all questions fully answered. Patient instructed to return to ED, call 911, or call MD for any changes in condition.  Patient will discharge home after 8pm. GI was consulted by Attending and states patient can go home after camera equipment is taken off of patient.   Salley Slaughter, RN 08/28/2018 5:37 PM

## 2018-08-28 NOTE — Progress Notes (Signed)
Pt little bit elevated the set parameter for iv labetalol not met  Will continue to monitor

## 2018-08-28 NOTE — Progress Notes (Signed)
Pt c/o abdominal cramping has tylenol PRN per pt tylenol doesn't work for her pain called teaching service on call per on call verbal order wants pt to try tylenol first went to pt room to get her informed about the doctors order  pt refused to take the tylenol will continue to monitor

## 2018-08-29 LAB — GC/CHLAMYDIA PROBE AMP (~~LOC~~) NOT AT ARMC
CHLAMYDIA, DNA PROBE: NEGATIVE
NEISSERIA GONORRHEA: NEGATIVE

## 2018-08-30 ENCOUNTER — Encounter (HOSPITAL_COMMUNITY): Payer: Self-pay | Admitting: Gastroenterology

## 2019-03-31 ENCOUNTER — Other Ambulatory Visit: Payer: Self-pay | Admitting: Gastroenterology

## 2019-03-31 ENCOUNTER — Ambulatory Visit
Admission: RE | Admit: 2019-03-31 | Discharge: 2019-03-31 | Disposition: A | Discharge status: Home / Self Care | Payer: PRIVATE HEALTH INSURANCE | Source: Ambulatory Visit | Attending: Gastroenterology | Admitting: Gastroenterology

## 2019-03-31 DIAGNOSIS — R197 Diarrhea, unspecified: Secondary | ICD-10-CM

## 2019-04-03 ENCOUNTER — Ambulatory Visit
Admission: RE | Admit: 2019-04-03 | Discharge: 2019-04-03 | Disposition: A | Discharge status: Home / Self Care | Payer: PRIVATE HEALTH INSURANCE | Source: Ambulatory Visit | Attending: Gastroenterology | Admitting: Gastroenterology

## 2019-04-03 DIAGNOSIS — R197 Diarrhea, unspecified: Secondary | ICD-10-CM

## 2019-11-07 IMAGING — CT CT ABD-PELV W/ CM
2 of 5 series · 17 of 46 positions shown, 19 images · IV contrast (omnipaque)
Comparison: 12/10/2017

CLINICAL DATA: Abdominal pain, rectal bleeding

EXAM:
CT ABDOMEN AND PELVIS WITH CONTRAST
TECHNIQUE: Multidetector CT imaging of the abdomen and pelvis was performed
using the standard protocol following bolus administration of
intravenous contrast.
CONTRAST:  100mL OMNIPAQUE IOHEXOL 300 MG/ML  SOLN

[Series 3: a/p w/ 5mm · axial · 0.91mm/px · z∈[+757,+1167]mm · 14 of 92 slices shown, 16 images]
[im 5/92  soft-tissue]
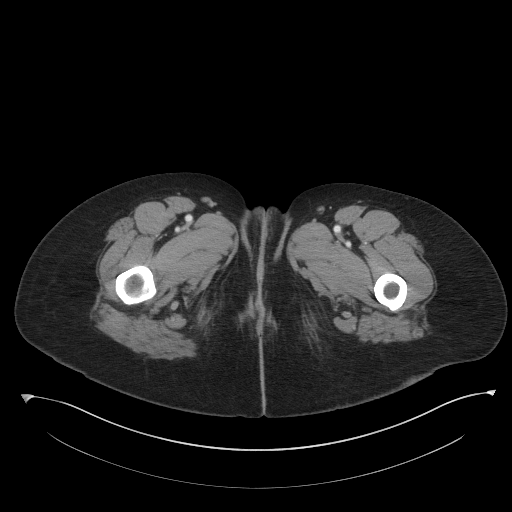
[im 5/92  bone]
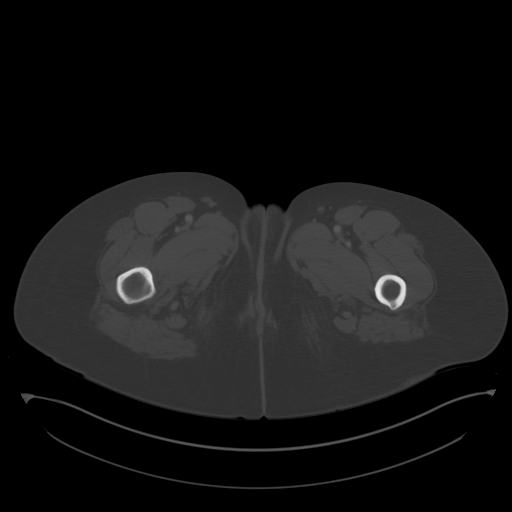
[im 10/92  soft-tissue]
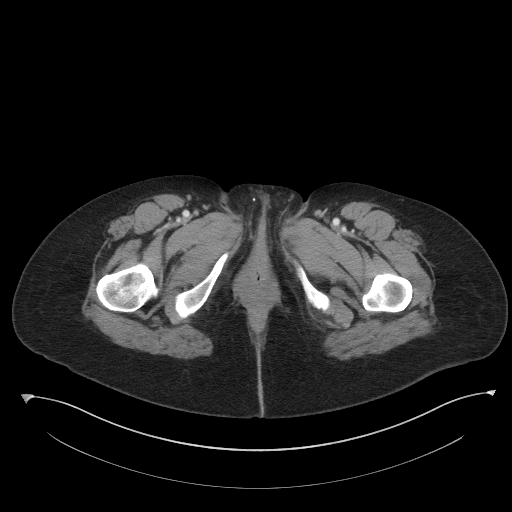
[im 20/92  soft-tissue]
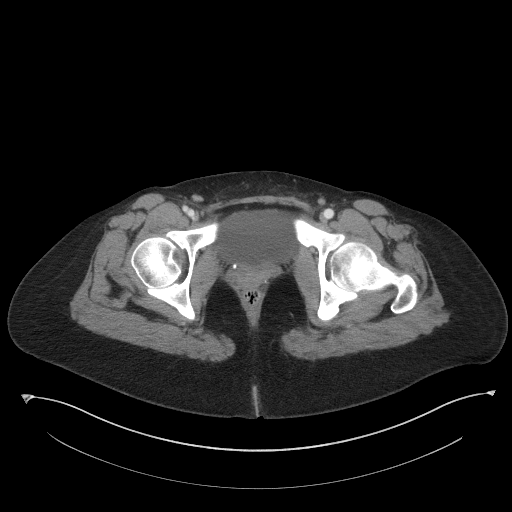
[im 24/92  soft-tissue]
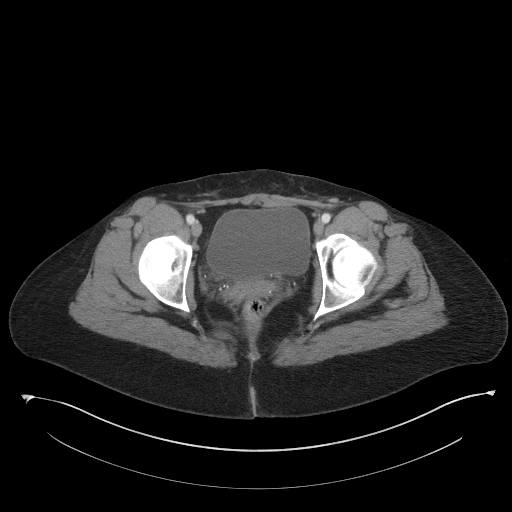
[im 29/92  soft-tissue]
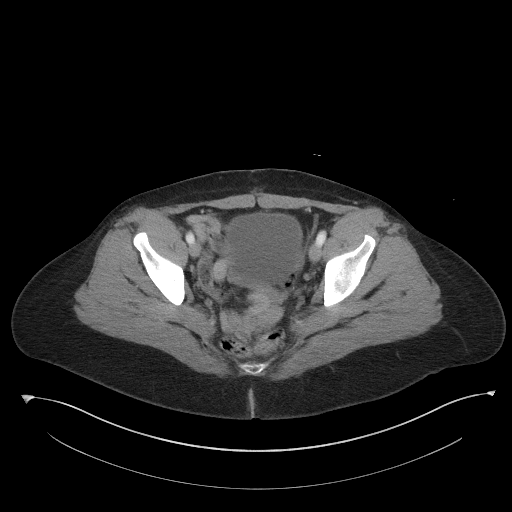
[im 39/92  soft-tissue]
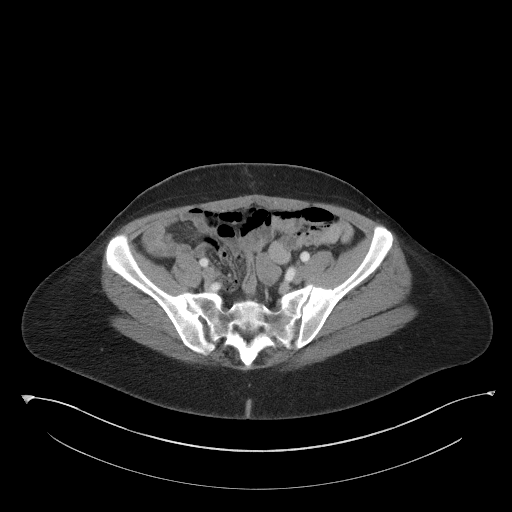
[im 44/92  soft-tissue]
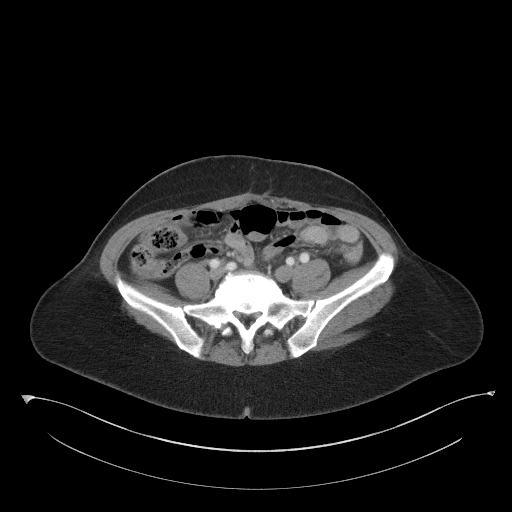
[im 48/92  soft-tissue]
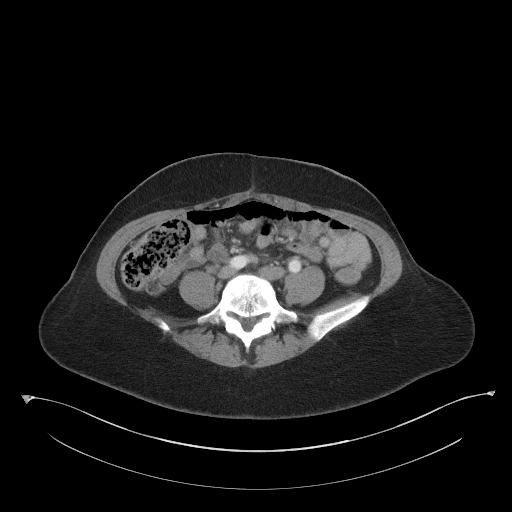
[im 53/92  soft-tissue]
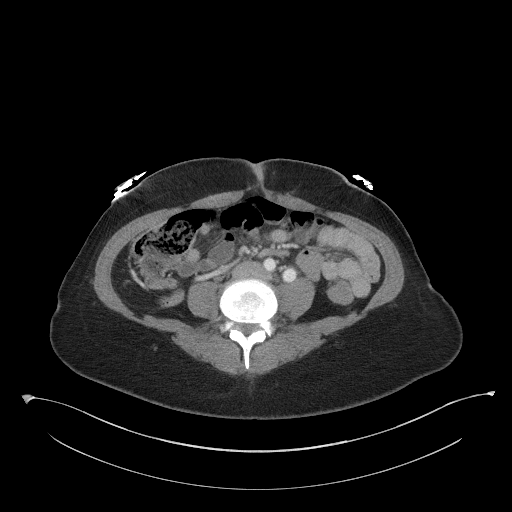
[im 53/92  bone]
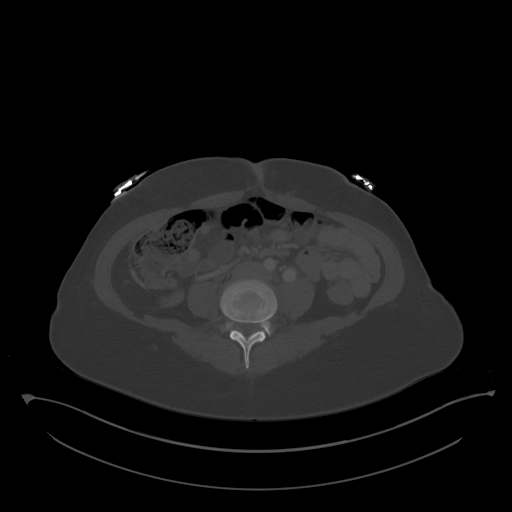
[im 63/92  soft-tissue]
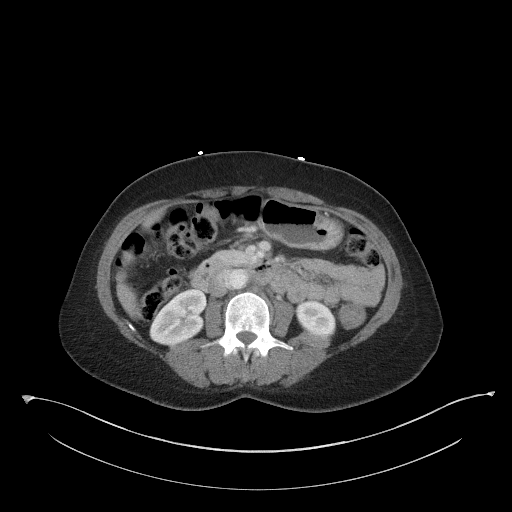
[im 68/92  soft-tissue]
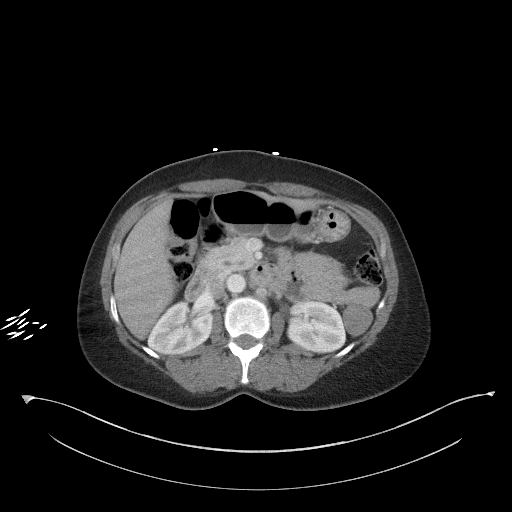
[im 72/92  soft-tissue]
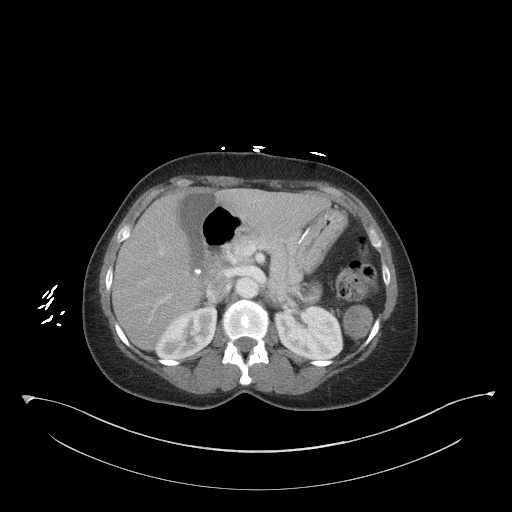
[im 82/92  soft-tissue]
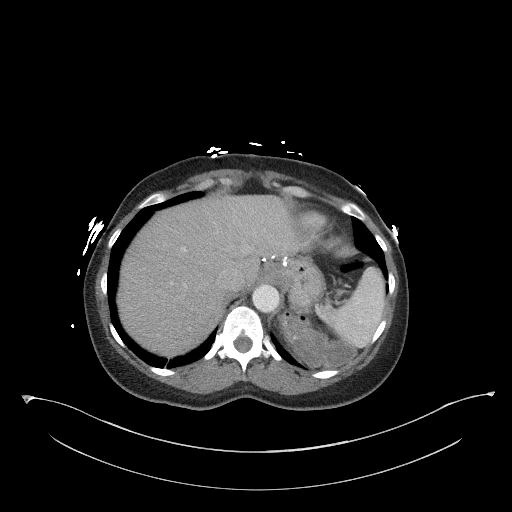
[im 87/92  soft-tissue]
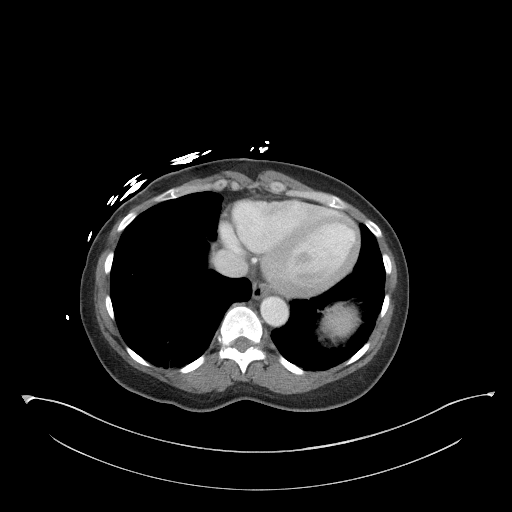

[Series 6: a/p w/ cor · coronal · 0.90mm/px · 3 of 142 slices shown]
[im 48/142  soft-tissue]
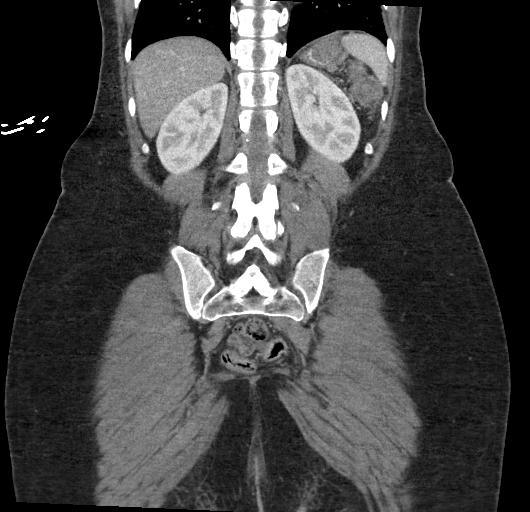
[im 63/142  soft-tissue]
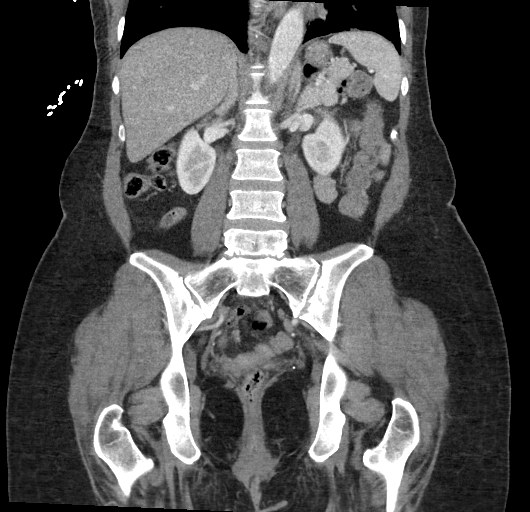
[im 79/142  soft-tissue]
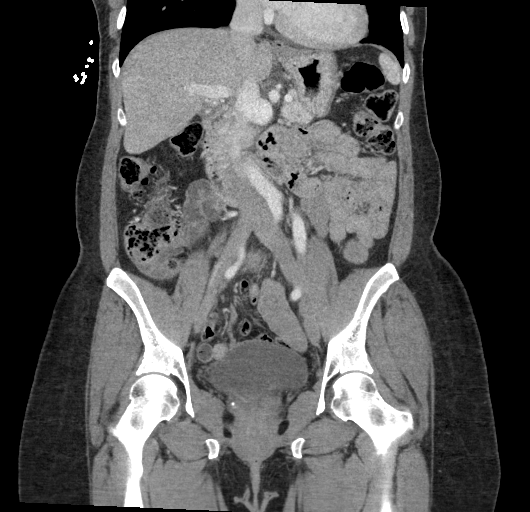

[17 of 46 positions shown; findings below may reference images not displayed]

FINDINGS: Lower chest: Dependent atelectasis in the lung bases. Heart is
borderline in size. No effusions.

Hepatobiliary: 6 mm gallstone layering within the gallbladder.
Diffuse fatty infiltration of the liver. No focal hepatic
abnormality or biliary ductal dilatation.

Pancreas: No focal abnormality or ductal dilatation.

Spleen: No focal abnormality.  Normal size.

Adrenals/Urinary Tract: No adrenal abnormality. No focal renal
abnormality. No stones or hydronephrosis. Urinary bladder is
unremarkable.

Stomach/Bowel: Normal appendix. Stomach, large and small bowel
grossly unremarkable. Postoperative changes in the stomach.

Vascular/Lymphatic: No evidence of aneurysm or adenopathy.

Reproductive: Prior hysterectomy.  No adnexal masses.

Other: No free fluid or free air.

Musculoskeletal: No acute bony abnormality.
IMPRESSION: Cholelithiasis.  No CT evidence of cholecystitis.

Fatty infiltration of the liver.

Postoperative changes in the stomach. No acute bowel abnormality
visualized. Normal appendix.

Bibasilar atelectasis.

## 2020-05-04 ENCOUNTER — Other Ambulatory Visit: Payer: Self-pay

## 2020-05-04 ENCOUNTER — Encounter (HOSPITAL_COMMUNITY): Payer: Self-pay

## 2020-05-04 ENCOUNTER — Emergency Department (HOSPITAL_COMMUNITY)
Admission: EM | Admit: 2020-05-04 | Discharge: 2020-05-04 | Disposition: A | Discharge status: Home / Self Care | Payer: 59 | Source: Home / Self Care | Attending: Emergency Medicine | Admitting: Emergency Medicine

## 2020-05-04 DIAGNOSIS — R55 Syncope and collapse: Secondary | ICD-10-CM | POA: Insufficient documentation

## 2020-05-04 DIAGNOSIS — K921 Melena: Secondary | ICD-10-CM | POA: Insufficient documentation

## 2020-05-04 DIAGNOSIS — K625 Hemorrhage of anus and rectum: Secondary | ICD-10-CM | POA: Insufficient documentation

## 2020-05-04 DIAGNOSIS — K922 Gastrointestinal hemorrhage, unspecified: Secondary | ICD-10-CM | POA: Insufficient documentation

## 2020-05-04 DIAGNOSIS — I10 Essential (primary) hypertension: Secondary | ICD-10-CM | POA: Insufficient documentation

## 2020-05-04 DIAGNOSIS — F17210 Nicotine dependence, cigarettes, uncomplicated: Secondary | ICD-10-CM | POA: Insufficient documentation

## 2020-05-04 DIAGNOSIS — Z79899 Other long term (current) drug therapy: Secondary | ICD-10-CM | POA: Insufficient documentation

## 2020-05-04 DIAGNOSIS — Z5321 Procedure and treatment not carried out due to patient leaving prior to being seen by health care provider: Secondary | ICD-10-CM | POA: Diagnosis not present
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
## 2020-05-04 NOTE — ED Notes (Signed)
Pt left AMA. Dr Tyrone Nine aware. MD spoke with pt and told pt it was her right. This RN was not able to get vitals before pt walked out.

## 2020-05-04 NOTE — Discharge Instructions (Signed)
Please return for worsening bleeding or if you feel like you are going to pass out.  Please follow-up with your gastroenterologist.  Please call your family doctor on Monday.  You have declined to have this test or blood here, it is impossible for me to know what your blood levels are unless we checked them with the laboratory finding.  Makes it difficult for me to guide you on what would be the safest thing for you.  Please return for worsening or if you change your mind and would like for [place] to evaluate you.

## 2020-05-04 NOTE — ED Triage Notes (Signed)
Pt BIB [facility] from home c/o GI bleed x5 days. Pt states she has a GI history and thought it would stop on it's on. EMS stated about 750 ml of bright red blood along with clots on scene. Pt was initially diaphoretic and BP was 88/44. BP en route was 108/68. Pt alert and oriented x4. Pt denies any pain.

## 2020-05-05 ENCOUNTER — Emergency Department (HOSPITAL_COMMUNITY)
Admission: EM | Admit: 2020-05-05 | Discharge: 2020-05-05 | Disposition: A | Discharge status: Home / Self Care | Payer: 59 | Source: Home / Self Care

## 2020-05-05 ENCOUNTER — Encounter (HOSPITAL_COMMUNITY): Payer: Self-pay | Admitting: Emergency Medicine

## 2020-05-05 ENCOUNTER — Emergency Department (HOSPITAL_COMMUNITY)
Admission: EM | Admit: 2020-05-05 | Discharge: 2020-05-05 | Disposition: A | Discharge status: Home / Self Care | Payer: 59 | Attending: Emergency Medicine | Admitting: Emergency Medicine

## 2020-05-05 ENCOUNTER — Other Ambulatory Visit: Payer: Self-pay

## 2020-05-05 DIAGNOSIS — Z5321 Procedure and treatment not carried out due to patient leaving prior to being seen by health care provider: Secondary | ICD-10-CM | POA: Insufficient documentation

## 2020-05-05 DIAGNOSIS — K625 Hemorrhage of anus and rectum: Secondary | ICD-10-CM | POA: Insufficient documentation

## 2020-05-05 LAB — COMPREHENSIVE METABOLIC PANEL
ALT: 11 U/L (ref 0–44)
AST: 17 U/L (ref 15–41)
Albumin: 3.7 g/dL (ref 3.5–5.0)
Alkaline Phosphatase: 48 U/L (ref 38–126)
Anion gap: 10 (ref 5–15)
BUN: 11 mg/dL (ref 6–20)
CO2: 27 mmol/L (ref 22–32)
Calcium: 8.6 mg/dL — ABNORMAL LOW (ref 8.9–10.3)
Chloride: 105 mmol/L (ref 98–111)
Creatinine, Ser: 0.78 mg/dL (ref 0.44–1.00)
GFR calc Af Amer: >60 mL/min (ref >60)
GFR calc non Af Amer: >60 mL/min (ref >60)
Glucose, Bld: 128 mg/dL — ABNORMAL HIGH (ref 70–99)
Potassium: 4 mmol/L (ref 3.5–5.1)
Sodium: 142 mmol/L (ref 135–145)
Total Bilirubin: 0.5 mg/dL (ref 0.3–1.2)
Total Protein: 6 g/dL — ABNORMAL LOW (ref 6.5–8.1)

## 2020-05-05 LAB — HCG, QUANTITATIVE, PREGNANCY: hCG, Beta Chain, Quant, S: 3 m[IU]/mL (ref <5)

## 2020-05-05 LAB — CBC
HCT: 27 % — ABNORMAL LOW (ref 36.0–46.0)
Hemoglobin: 8.6 g/dL — ABNORMAL LOW (ref 12.0–15.0)
MCH: 30.2 pg (ref 26.0–34.0)
MCHC: 31.9 g/dL (ref 30.0–36.0)
MCV: 94.7 fL (ref 80.0–100.0)
Platelets: 202 10*3/uL (ref 150–400)
RBC: 2.85 MIL/uL — ABNORMAL LOW (ref 3.87–5.11)
RDW: 13.8 % (ref 11.5–15.5)
WBC: 9.9 10*3/uL (ref 4.0–10.5)
nRBC: 0 % (ref 0.0–0.2)

## 2020-05-05 LAB — LIPASE, BLOOD: Lipase: 34 U/L (ref 11–51)

## 2020-05-05 MED ORDER — SODIUM CHLORIDE 0.9% FLUSH: 3.0000 mL | Freq: Once | INTRAVENOUS | Status: DC

## 2020-05-05 NOTE — ED Triage Notes (Signed)
Patient is complaining of rectal bleeding x 5 days. Patient states that she was at [facility] and left because she did not want them treating her.

## 2020-06-26 ENCOUNTER — Inpatient Hospital Stay (HOSPITAL_COMMUNITY)
Admission: EM | Admit: 2020-06-26 | Discharge: 2020-06-28 | DRG: 378 | Disposition: A | Discharge status: Home / Self Care | Payer: 59 | Attending: Internal Medicine | Admitting: Internal Medicine

## 2020-06-26 ENCOUNTER — Encounter (HOSPITAL_COMMUNITY): Payer: Self-pay | Admitting: Family Medicine

## 2020-06-26 ENCOUNTER — Other Ambulatory Visit: Payer: Self-pay

## 2020-06-26 DIAGNOSIS — I951 Orthostatic hypotension: Secondary | ICD-10-CM | POA: Diagnosis not present

## 2020-06-26 DIAGNOSIS — Z79899 Other long term (current) drug therapy: Secondary | ICD-10-CM

## 2020-06-26 DIAGNOSIS — D649 Anemia, unspecified: Secondary | ICD-10-CM | POA: Diagnosis present

## 2020-06-26 DIAGNOSIS — Z9221 Personal history of antineoplastic chemotherapy: Secondary | ICD-10-CM

## 2020-06-26 DIAGNOSIS — F419 Anxiety disorder, unspecified: Secondary | ICD-10-CM | POA: Diagnosis present

## 2020-06-26 DIAGNOSIS — I10 Essential (primary) hypertension: Secondary | ICD-10-CM | POA: Diagnosis not present

## 2020-06-26 DIAGNOSIS — K625 Hemorrhage of anus and rectum: Secondary | ICD-10-CM | POA: Diagnosis not present

## 2020-06-26 DIAGNOSIS — D62 Acute posthemorrhagic anemia: Secondary | ICD-10-CM | POA: Diagnosis present

## 2020-06-26 DIAGNOSIS — Z85831 Personal history of malignant neoplasm of soft tissue: Secondary | ICD-10-CM

## 2020-06-26 DIAGNOSIS — Z803 Family history of malignant neoplasm of breast: Secondary | ICD-10-CM

## 2020-06-26 DIAGNOSIS — Z833 Family history of diabetes mellitus: Secondary | ICD-10-CM

## 2020-06-26 DIAGNOSIS — R55 Syncope and collapse: Secondary | ICD-10-CM | POA: Diagnosis present

## 2020-06-26 DIAGNOSIS — H5462 Unqualified visual loss, left eye, normal vision right eye: Secondary | ICD-10-CM | POA: Diagnosis present

## 2020-06-26 DIAGNOSIS — K5731 Diverticulosis of large intestine without perforation or abscess with bleeding: Secondary | ICD-10-CM | POA: Diagnosis not present

## 2020-06-26 DIAGNOSIS — K922 Gastrointestinal hemorrhage, unspecified: Secondary | ICD-10-CM | POA: Diagnosis present

## 2020-06-26 DIAGNOSIS — M1990 Unspecified osteoarthritis, unspecified site: Secondary | ICD-10-CM | POA: Diagnosis present

## 2020-06-26 DIAGNOSIS — Z903 Acquired absence of stomach [part of]: Secondary | ICD-10-CM

## 2020-06-26 DIAGNOSIS — Z8049 Family history of malignant neoplasm of other genital organs: Secondary | ICD-10-CM

## 2020-06-26 DIAGNOSIS — F17210 Nicotine dependence, cigarettes, uncomplicated: Secondary | ICD-10-CM | POA: Diagnosis present

## 2020-06-26 DIAGNOSIS — Z818 Family history of other mental and behavioral disorders: Secondary | ICD-10-CM

## 2020-06-26 DIAGNOSIS — Z8249 Family history of ischemic heart disease and other diseases of the circulatory system: Secondary | ICD-10-CM

## 2020-06-26 DIAGNOSIS — K921 Melena: Secondary | ICD-10-CM

## 2020-06-26 DIAGNOSIS — Z823 Family history of stroke: Secondary | ICD-10-CM

## 2020-06-26 DIAGNOSIS — K648 Other hemorrhoids: Secondary | ICD-10-CM | POA: Diagnosis present

## 2020-06-26 DIAGNOSIS — Z20822 Contact with and (suspected) exposure to covid-19: Secondary | ICD-10-CM | POA: Diagnosis present

## 2020-06-26 DIAGNOSIS — R519 Headache, unspecified: Secondary | ICD-10-CM | POA: Diagnosis present

## 2020-06-26 LAB — COMPREHENSIVE METABOLIC PANEL WITH GFR
ALT: 10 U/L (ref 0–44)
AST: 18 U/L (ref 15–41)
Albumin: 3.3 g/dL — ABNORMAL LOW (ref 3.5–5.0)
Alkaline Phosphatase: 57 U/L (ref 38–126)
Anion gap: 9 (ref 5–15)
BUN: 15 mg/dL (ref 6–20)
CO2: 22 mmol/L (ref 22–32)
Calcium: 8.5 mg/dL — ABNORMAL LOW (ref 8.9–10.3)
Chloride: 107 mmol/L (ref 98–111)
Creatinine, Ser: 0.78 mg/dL (ref 0.44–1.00)
GFR calc Af Amer: >60 mL/min
GFR calc non Af Amer: >60 mL/min
Glucose, Bld: 129 mg/dL — ABNORMAL HIGH (ref 70–99)
Potassium: 3.9 mmol/L (ref 3.5–5.1)
Sodium: 138 mmol/L (ref 135–145)
Total Bilirubin: 0.1 mg/dL — ABNORMAL LOW (ref 0.3–1.2)
Total Protein: 5.8 g/dL — ABNORMAL LOW (ref 6.5–8.1)

## 2020-06-26 LAB — HEMATOCRIT
HCT: 30.4 % — ABNORMAL LOW (ref 36.0–46.0)
HCT: 29.9 % — ABNORMAL LOW (ref 36.0–46.0)

## 2020-06-26 LAB — CBC WITH DIFFERENTIAL/PLATELET
Abs Immature Granulocytes: 0.02 K/uL (ref 0.00–0.07)
Basophils Absolute: 0 K/uL (ref 0.0–0.1)
Basophils Relative: 1 %
Eosinophils Absolute: 0.1 K/uL (ref 0.0–0.5)
Eosinophils Relative: 1 %
HCT: 31.1 % — ABNORMAL LOW (ref 36.0–46.0)
Hemoglobin: 9.2 g/dL — ABNORMAL LOW (ref 12.0–15.0)
Immature Granulocytes: 0 %
Lymphocytes Relative: 33 %
Lymphs Abs: 2.4 K/uL (ref 0.7–4.0)
MCH: 24.5 pg — ABNORMAL LOW (ref 26.0–34.0)
MCHC: 29.6 g/dL — ABNORMAL LOW (ref 30.0–36.0)
MCV: 82.9 fL (ref 80.0–100.0)
Monocytes Absolute: 0.5 K/uL (ref 0.1–1.0)
Monocytes Relative: 6 %
Neutro Abs: 4.4 K/uL (ref 1.7–7.7)
Neutrophils Relative %: 59 %
Platelets: 269 K/uL (ref 150–400)
RBC: 3.75 MIL/uL — ABNORMAL LOW (ref 3.87–5.11)
RDW: 17.1 % — ABNORMAL HIGH (ref 11.5–15.5)
WBC: 7.4 K/uL (ref 4.0–10.5)
nRBC: 0 % (ref 0.0–0.2)

## 2020-06-26 LAB — POC OCCULT BLOOD, ED: Fecal Occult Bld: POSITIVE — AB

## 2020-06-26 LAB — SARS CORONAVIRUS 2 BY RT PCR (HOSPITAL ORDER, PERFORMED IN ~~LOC~~ HOSPITAL LAB): SARS Coronavirus 2: NEGATIVE

## 2020-06-26 LAB — PROTIME-INR
INR: 1 (ref 0.8–1.2)
Prothrombin Time: 13.2 s (ref 11.4–15.2)

## 2020-06-26 LAB — HEMOGLOBIN
Hemoglobin: 8.9 g/dL — ABNORMAL LOW (ref 12.0–15.0)
Hemoglobin: 8.7 g/dL — ABNORMAL LOW (ref 12.0–15.0)

## 2020-06-26 LAB — HIV ANTIBODY (ROUTINE TESTING W REFLEX): HIV Screen 4th Generation wRfx: NONREACTIVE

## 2020-06-26 MED ORDER — RIZATRIPTAN BENZOATE 10 MG PO TBDP: 10.0000 mg | ORAL_TABLET | Freq: Once | ORAL | Status: DC | PRN

## 2020-06-26 MED ORDER — SODIUM CHLORIDE 0.9 % IV SOLN
80.0000 mg | Freq: Once | INTRAVENOUS | Status: AC
  Administered 2020-06-26: 80 mg via INTRAVENOUS
  Filled 2020-06-26: qty 80

## 2020-06-26 MED ORDER — LOSARTAN POTASSIUM 50 MG PO TABS
50.0000 mg | ORAL_TABLET | Freq: Every day | ORAL | Status: DC
  Administered 2020-06-26 – 2020-06-27 (×2): 50 mg via ORAL
  Filled 2020-06-26 (×2): qty 1

## 2020-06-26 MED ORDER — SODIUM CHLORIDE 0.9 % IV SOLN
8.0000 mg/h | INTRAVENOUS | Status: DC
  Administered 2020-06-26 – 2020-06-28 (×6): 8 mg/h via INTRAVENOUS
  Filled 2020-06-26 (×6): qty 80

## 2020-06-26 MED ORDER — PHENOL 1.4 % MT LIQD
1.0000 | OROMUCOSAL | Status: DC | PRN
  Filled 2020-06-26: qty 177

## 2020-06-26 MED ORDER — SODIUM CHLORIDE 0.9 % IV SOLN: INTRAVENOUS | Status: DC

## 2020-06-26 MED ORDER — SODIUM CHLORIDE 0.9% FLUSH
3.0000 mL | Freq: Two times a day (BID) | INTRAVENOUS | Status: DC
  Administered 2020-06-26 – 2020-06-27 (×3): 3 mL via INTRAVENOUS

## 2020-06-26 MED ORDER — SODIUM CHLORIDE 0.9 % IV BOLUS
1000.0000 mL | Freq: Once | INTRAVENOUS | Status: AC
  Administered 2020-06-26: 1000 mL via INTRAVENOUS

## 2020-06-26 MED ORDER — ACETAMINOPHEN 325 MG PO TABS
650.0000 mg | ORAL_TABLET | Freq: Four times a day (QID) | ORAL | Status: DC | PRN
  Administered 2020-06-26 – 2020-06-27 (×2): 650 mg via ORAL
  Filled 2020-06-26 (×2): qty 2

## 2020-06-26 MED ORDER — SUMATRIPTAN SUCCINATE 25 MG PO TABS
25.0000 mg | ORAL_TABLET | ORAL | Status: DC | PRN
  Administered 2020-06-27 (×2): 25 mg via ORAL
  Filled 2020-06-26 (×5): qty 1
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
## 2020-06-26 NOTE — Progress Notes (Signed)
PROGRESS NOTE    FIDELIS LOTH  KLK:[id number] DOB: 29-Oct-1970 DOA: 06/26/2020 PCP: Gerald Leitz    Brief Narrative:  49 y.o. female with medical history significant for GI stromal tumor status post resection in 2005, hypertension, and headaches, now presenting to the emergency department with lightheadedness and syncope after an episode of painless hematochezia.  Patient reports that she was in her usual state of health when she felt a sudden urge to move her bowels and then saw a large volume of maroon blood in the toilet.  She became lightheaded after this and then had a brief loss of consciousness when she went to stand up.  She recovered awareness quickly, continued to be lightheaded when sitting up or standing, and came into the ED.  She denies any abdominal pain, denies nausea or vomiting, and denies any recent fevers or chills.  She was admitted to [facility] from 05/06/2020 until 05/08/2020 after a very similar episode, underwent EGD with no source of bleeding identified but there were enlarged folds in the cardia that may have reflected postoperative changes and biopsies of this site were taken.  She also went colonoscopy that was notable for diverticulosis but no clear bleeding.  She was discharged home with a new prescription for Protonix but was not taking it at time of her hospital follow-up appointment and confirms that she is still not taking it.  She takes occasional Goody's powders for aches and pains  Assessment & Plan:   Principal Problem:   Rectal bleeding Active Problems:   Essential hypertension   Anemia, unspecified   Syncope   1. Rectal bleeding with acute blood loss anemia  - Presents with painless hematochezia, Hgb down 1g since early August, and she is orthostatic in ED  - She underwent EGD and colonoscopy last month after a similar episode, diverticulosis was noted  - Diverticular bleed seems most likely; she has history of partial  gastrectomy, does not take her prescribed PPI, uses occasional NSAID, and brisk upper source considered but she denies any upper GI sxs and BUN is normal - Hgb stable thus far - Will repeat CBC in AM -GI consulted, appreciate recs  2. Syncope; orthostasis  - Patient became acute lightheaded after hematochezia and then had brief LOC when she tried to stand up  - Noted to be orthostatic in ED  - Continue IVF hydration, follow H&H and transfuse as needed  3. Hypertension  - Pt presented with acute GIB and orthostasis and antihypertensives were initially held -BP stable at this time, home ARB resumed  4. 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  Dispo: The patient is from: Home              Anticipated d/c is to: Home              Anticipated d/c date is: 2 days              Patient currently is not medically stable to d/c.       Consultants:   GI  Procedures:     Antimicrobials: Anti-infectives (From admission, onward)   None       Subjective:  Denies abd pain or nausea  Objective: Vitals:   06/26/20 1415 06/26/20 1430 06/26/20 1445 06/26/20 1539  BP: (!) 152/114 (!) 140/104 (!) 155/108 (!) 162/109  Pulse: 69 63 (!) 58 68  Resp: 14 15 13 17   Temp:    99.1 F (37.3 C)  TempSrc:    Oral  SpO2: 99% 99% 99% 99%  Weight:      Height:        Intake/Output Summary (Last 24 hours) at 06/26/2020 1600 Last data filed at 06/26/2020 1539 Gross per 24 hour  Intake 2188.94 ml  Output --  Net 2188.94 ml   Filed Weights   06/26/20 0321  Weight: 84.8 kg    Examination:  General exam: Appears calm and comfortable  Respiratory system: Clear to auscultation. Respiratory  effort normal. Cardiovascular system: S1 & S2 heard, Regular Gastrointestinal system: Abdomen is nondistended, soft and nontender. No organomegaly or masses felt. Normal bowel sounds heard. Central nervous system: Alert and oriented. No focal neurological deficits. Extremities: Symmetric 5 x 5 power. Skin: No rashes, lesions Psychiatry: Judgement and insight appear normal. Mood & affect appropriate.   Data Reviewed: I have personally reviewed following labs and imaging studies  CBC: Recent Labs  Lab 06/26/20 0334 06/26/20 0832 06/26/20 1221  WBC 7.4  --   --   NEUTROABS 4.4  --   --   HGB 9.2* 8.9* 8.7*  HCT 31.1* 30.4* 29.9*  MCV 82.9  --   --   PLT 269  --   --    Basic Metabolic Panel: Recent Labs  Lab 06/26/20 0334  NA 138  K 3.9  CL 107  CO2 22  GLUCOSE 129*  BUN 15  CREATININE 0.78  CALCIUM 8.5*   GFR: Estimated Creatinine Clearance: 101 mL/min (by C-G formula based on SCr of 0.78 mg/dL). Liver Function Tests: Recent Labs  Lab 06/26/20 0334  AST 18  ALT 10  ALKPHOS 57  BILITOT 0.1*  PROT 5.8*  ALBUMIN 3.3*   No results for input(s): LIPASE, AMYLASE in the last 168 hours. No results for input(s): AMMONIA in the last 168 hours. Coagulation Profile: Recent Labs  Lab 06/26/20 0334  INR 1.0   Cardiac Enzymes: No results for input(s): CKTOTAL, CKMB, CKMBINDEX, TROPONINI in the last 168 hours. BNP (last 3 results) No results for input(s): PROBNP in the last 8760 hours. HbA1C: No results for input(s): HGBA1C in the last 72 hours. CBG: No results for input(s): GLUCAP in the last 168 hours. Lipid Profile: No results for input(s): CHOL, HDL, LDLCALC, TRIG, CHOLHDL, LDLDIRECT in the last 72 hours. Thyroid Function Tests: No results for input(s): TSH, T4TOTAL, FREET4, T3FREE, THYROIDAB in the last 72 hours. Anemia Panel: No results for input(s): VITAMINB12, FOLATE, FERRITIN, TIBC, IRON, RETICCTPCT in the last 72 hours. Sepsis Labs: No results for  input(s): PROCALCITON, LATICACIDVEN in the last 168 hours.  Recent Results (from the past 240 hour(s))  SARS Coronavirus 2 by RT PCR (hospital order, performed in [facility] hospital lab) Nasopharyngeal Nasopharyngeal Swab     Status: None   Collection Time: 06/26/20  5:57 AM   Specimen: Nasopharyngeal Swab  Result Value Ref Range Status   SARS Coronavirus 2 NEGATIVE NEGATIVE Final    Comment: (NOTE) SARS-CoV-2 target nucleic acids are NOT DETECTED.  The SARS-CoV-2 RNA is generally detectable in upper and lower respiratory specimens during the acute phase of infection. The lowest concentration of SARS-CoV-2 viral copies this assay can detect is 250 copies /  mL. A negative result does not preclude SARS-CoV-2 infection and should not be used as the sole basis for treatment or other patient management decisions.  A negative result may occur with improper specimen collection / handling, submission of specimen other than nasopharyngeal swab, presence of viral mutation(s) within the areas targeted by this assay, and inadequate number of viral copies (<250 copies / mL). A negative result must be combined with clinical observations, patient history, and epidemiological information.  Fact Sheet for Patients:   StrictlyIdeas.no  Fact Sheet for Healthcare Providers: BankingDealers.co.za  This test is not yet approved or  cleared by the [place] FDA and has been authorized for detection and/or diagnosis of SARS-CoV-2 by FDA under an Emergency Use Authorization (EUA).  This EUA will remain in effect (meaning this test can be used) for the duration of the COVID-19 declaration under Section 564(b)(1) of the Act, 21 U.S.C. section 360bbb-3(b)(1), unless the authorization is terminated or revoked sooner.  Performed at [facility], [place] [address], [place], [place] [address]      Radiology Studies: No results found.  Scheduled  Meds: . losartan  50 mg Oral Daily  . sodium chloride flush  3 mL Intravenous Q12H   Continuous Infusions: . sodium chloride 100 mL/hr at 06/26/20 0704  . pantoprozole (PROTONIX) infusion 8 mg/hr (06/26/20 0615)     LOS: 0 days   Marylu Lund, MD [facility] Pager On Amion  If 7PM-7AM, please contact night-coverage 06/26/2020, 4:00 PM
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
## 2020-06-26 NOTE — ED Notes (Signed)
Pt experienced dizziness while standing
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
## 2020-06-27 ENCOUNTER — Inpatient Hospital Stay (HOSPITAL_COMMUNITY): Payer: 59

## 2020-06-27 ENCOUNTER — Observation Stay: Payer: 59

## 2020-06-27 DIAGNOSIS — Z8249 Family history of ischemic heart disease and other diseases of the circulatory system: Secondary | ICD-10-CM | POA: Diagnosis not present

## 2020-06-27 DIAGNOSIS — R519 Headache, unspecified: Secondary | ICD-10-CM | POA: Diagnosis present

## 2020-06-27 DIAGNOSIS — K648 Other hemorrhoids: Secondary | ICD-10-CM | POA: Diagnosis present

## 2020-06-27 DIAGNOSIS — K5731 Diverticulosis of large intestine without perforation or abscess with bleeding: Secondary | ICD-10-CM | POA: Diagnosis present

## 2020-06-27 DIAGNOSIS — F17210 Nicotine dependence, cigarettes, uncomplicated: Secondary | ICD-10-CM | POA: Diagnosis present

## 2020-06-27 DIAGNOSIS — Z833 Family history of diabetes mellitus: Secondary | ICD-10-CM | POA: Diagnosis not present

## 2020-06-27 DIAGNOSIS — K922 Gastrointestinal hemorrhage, unspecified: Secondary | ICD-10-CM | POA: Diagnosis present

## 2020-06-27 DIAGNOSIS — Z8049 Family history of malignant neoplasm of other genital organs: Secondary | ICD-10-CM | POA: Diagnosis not present

## 2020-06-27 DIAGNOSIS — Z20822 Contact with and (suspected) exposure to covid-19: Secondary | ICD-10-CM | POA: Diagnosis present

## 2020-06-27 DIAGNOSIS — Z85831 Personal history of malignant neoplasm of soft tissue: Secondary | ICD-10-CM | POA: Diagnosis not present

## 2020-06-27 DIAGNOSIS — Z79899 Other long term (current) drug therapy: Secondary | ICD-10-CM | POA: Diagnosis not present

## 2020-06-27 DIAGNOSIS — Z803 Family history of malignant neoplasm of breast: Secondary | ICD-10-CM | POA: Diagnosis not present

## 2020-06-27 DIAGNOSIS — Z9221 Personal history of antineoplastic chemotherapy: Secondary | ICD-10-CM | POA: Diagnosis not present

## 2020-06-27 DIAGNOSIS — Z818 Family history of other mental and behavioral disorders: Secondary | ICD-10-CM | POA: Diagnosis not present

## 2020-06-27 DIAGNOSIS — Z823 Family history of stroke: Secondary | ICD-10-CM | POA: Diagnosis not present

## 2020-06-27 DIAGNOSIS — H5462 Unqualified visual loss, left eye, normal vision right eye: Secondary | ICD-10-CM | POA: Diagnosis present

## 2020-06-27 DIAGNOSIS — K625 Hemorrhage of anus and rectum: Secondary | ICD-10-CM | POA: Diagnosis not present

## 2020-06-27 DIAGNOSIS — D649 Anemia, unspecified: Secondary | ICD-10-CM | POA: Diagnosis not present

## 2020-06-27 DIAGNOSIS — Z903 Acquired absence of stomach [part of]: Secondary | ICD-10-CM | POA: Diagnosis not present

## 2020-06-27 DIAGNOSIS — F419 Anxiety disorder, unspecified: Secondary | ICD-10-CM | POA: Diagnosis present

## 2020-06-27 DIAGNOSIS — I10 Essential (primary) hypertension: Secondary | ICD-10-CM | POA: Diagnosis present

## 2020-06-27 DIAGNOSIS — D62 Acute posthemorrhagic anemia: Secondary | ICD-10-CM | POA: Diagnosis present

## 2020-06-27 DIAGNOSIS — I951 Orthostatic hypotension: Secondary | ICD-10-CM | POA: Diagnosis present

## 2020-06-27 DIAGNOSIS — M1990 Unspecified osteoarthritis, unspecified site: Secondary | ICD-10-CM | POA: Diagnosis present

## 2020-06-27 LAB — CBC
HCT: 20 % — ABNORMAL LOW (ref 36.0–46.0)
HCT: 19.9 % — ABNORMAL LOW (ref 36.0–46.0)
Hemoglobin: 5.8 g/dL — CL (ref 12.0–15.0)
Hemoglobin: 6.2 g/dL — CL (ref 12.0–15.0)
MCH: 24.4 pg — ABNORMAL LOW (ref 26.0–34.0)
MCH: 26.4 pg (ref 26.0–34.0)
MCHC: 29 g/dL — ABNORMAL LOW (ref 30.0–36.0)
MCHC: 31.2 g/dL (ref 30.0–36.0)
MCV: 84 fL (ref 80.0–100.0)
MCV: 84.7 fL (ref 80.0–100.0)
Platelets: UNDETERMINED 10*3/uL (ref 150–400)
Platelets: 154 10*3/uL (ref 150–400)
RBC: 2.38 MIL/uL — ABNORMAL LOW (ref 3.87–5.11)
RBC: 2.35 MIL/uL — ABNORMAL LOW (ref 3.87–5.11)
RDW: 17.2 % — ABNORMAL HIGH (ref 11.5–15.5)
RDW: 16.2 % — ABNORMAL HIGH (ref 11.5–15.5)
WBC: 6.8 10*3/uL (ref 4.0–10.5)
WBC: 12.6 10*3/uL — ABNORMAL HIGH (ref 4.0–10.5)
nRBC: 0 % (ref 0.0–0.2)
nRBC: 0 % (ref 0.0–0.2)

## 2020-06-27 LAB — BASIC METABOLIC PANEL
Anion gap: 6 (ref 5–15)
BUN: 11 mg/dL (ref 6–20)
CO2: 21 mmol/L — ABNORMAL LOW (ref 22–32)
Calcium: 7.2 mg/dL — ABNORMAL LOW (ref 8.9–10.3)
Chloride: 115 mmol/L — ABNORMAL HIGH (ref 98–111)
Creatinine, Ser: 0.86 mg/dL (ref 0.44–1.00)
GFR calc Af Amer: >60 mL/min (ref >60)
GFR calc non Af Amer: >60 mL/min (ref >60)
Glucose, Bld: 164 mg/dL — ABNORMAL HIGH (ref 70–99)
Potassium: 3.2 mmol/L — ABNORMAL LOW (ref 3.5–5.1)
Sodium: 142 mmol/L (ref 135–145)

## 2020-06-27 LAB — HEMOGLOBIN AND HEMATOCRIT, BLOOD
HCT: 21.3 % — ABNORMAL LOW (ref 36.0–46.0)
Hemoglobin: 6.5 g/dL — CL (ref 12.0–15.0)

## 2020-06-27 LAB — GLUCOSE, CAPILLARY: Glucose-Capillary: 169 mg/dL — ABNORMAL HIGH (ref 70–99)

## 2020-06-27 LAB — PREPARE RBC (CROSSMATCH)

## 2020-06-27 SURGERY — IMAGING PROCEDURE, GI TRACT, INTRALUMINAL, VIA CAPSULE
Anesthesia: LOCAL

## 2020-06-27 MED ORDER — SODIUM CHLORIDE 0.9 % IV BOLUS
500.0000 mL | Freq: Once | INTRAVENOUS | Status: AC
  Administered 2020-06-27: 500 mL via INTRAVENOUS

## 2020-06-27 MED ORDER — SODIUM CHLORIDE 0.9% IV SOLUTION: Freq: Once | INTRAVENOUS | Status: DC

## 2020-06-27 MED ORDER — SODIUM CHLORIDE 0.9 % IV BOLUS
1000.0000 mL | Freq: Once | INTRAVENOUS | Status: AC
  Administered 2020-06-27: 1000 mL via INTRAVENOUS

## 2020-06-27 MED ORDER — TECHNETIUM TC 99M-LABELED RED BLOOD CELLS IV KIT
20.0000 | PACK | Freq: Once | INTRAVENOUS | Status: AC | PRN
  Administered 2020-06-27: 20 via INTRAVENOUS

## 2020-06-27 NOTE — Progress Notes (Signed)
CRITICAL VALUE ALERT  Critical Value:  Hgb-5.8  Date & Time Notied:  06/27/2020 3:23 AM  Provider Notified: Dr. Myna Hidalgo  Orders Received/Actions taken: Transfuse 2 units PRBCs

## 2020-06-27 NOTE — Progress Notes (Signed)
Recurrent hematochezia with syncope and 3 g drop of hemoglobin during the course of the night.  Patient currently in nuclear medicine.  Hopefully, that scan will disclose a source of the patient's bleeding, although in my experience, this bleeding is often very intermittent so a negative scan would not be at all surprising.  Cleotis Nipper, M.D. Pager [phone] If no answer or after 5 PM call [phone]

## 2020-06-27 NOTE — Plan of Care (Signed)
  Problem: Education: Goal: Knowledge of General Education information will improve Description: Including pain rating scale, medication(s)/side effects and non-pharmacologic comfort measures Outcome: Progressing   Problem: Clinical Measurements: Goal: Diagnostic test results will improve Outcome: Progressing   Problem: Activity: Goal: Risk for activity intolerance will decrease Outcome: Progressing

## 2020-06-27 NOTE — Progress Notes (Signed)
Correction to above note: At the time of my previous chart rounds, I was under the impression that the patient was actually in Nuclear Medicine; in fact, however, patient had nuclear medicine scan ordered, but was not actually in nuclear medicine at the time I reviewed the electronic record.  I have just checked the patient at the bedside.  She is alert, well perfused, not shocky, but has just had a couple of bedpan's of liquid dark red blood.  Abdomen is soft and nontender and she remains free of pain.  In fact, she is wanting to eat.  She has received 2 units of packed cells.  The patient continues to be frustrated with the lack of diagnosis and states she wants to go back to [facility].  I told her I would let her hospitalist know, but that hospital-to-hospital transfers can often take quite a period of time, especially if the hospitals are full.  Therefore, I think we should proceed with diagnostic evaluation here but I will let her attending hospitalist decide whether or not to initiate the transfer process.  In the meantime, I confirmed with interventional radiology that the patient has to be n.p.o. for arterial embolization procedures; I explained to the patient that, even though it is not clear that she will end up getting an IR procedure, we have to keep her n.p.o. for the time being (even though she wants to eat) so that we have the ability to do an IR procedure if it should become necessary.  The other question is, what is the role of colonoscopy in this setting.  Of course, she just had a colonoscopy 6 weeks ago.  If the nuclear medicine scan is unrevealing but the patient continues to have intermittent bleeding, we could consider attempting a repeat, unprepped colonoscopy to try to get gross localization of the source of bleeding (that is, from above the ileocecal valve, proximal colon, or distal colon).  It is, however, very unlikely that colonoscopy would offer any therapeutic benefit,  especially since her recent colonoscopy was unrevealing for an endoscopically remediable source of bleeding.  Above has been discussed with Dr. Wyline Copas.  I just checked with nuclear medicine, and they are sending for the patient at this time.  Cleotis Nipper, M.D. Pager [phone] If no answer or after 5 PM call [phone]

## 2020-06-27 NOTE — Progress Notes (Signed)
PROGRESS NOTE    Sydney [name]  YOV:[id number] DOB: 1971/05/29 DOA: 06/26/2020 PCP: Gerald Leitz    Brief Narrative:  49 y.o. female with medical history significant for GI stromal tumor status post resection in 2005, hypertension, and headaches, now presenting to the emergency department with lightheadedness and syncope after an episode of painless hematochezia.  Patient reports that she was in her usual state of health when she felt a sudden urge to move her bowels and then saw a large volume of maroon blood in the toilet.  She became lightheaded after this and then had a brief loss of consciousness when she went to stand up.  She recovered awareness quickly, continued to be lightheaded when sitting up or standing, and came into the ED.  She denies any abdominal pain, denies nausea or vomiting, and denies any recent fevers or chills.  She was admitted to [facility] from 05/06/2020 until 05/08/2020 after a very similar episode, underwent EGD with no source of bleeding identified but there were enlarged folds in the cardia that may have reflected postoperative changes and biopsies of this site were taken.  She also went colonoscopy that was notable for diverticulosis but no clear bleeding.  She was discharged home with a new prescription for Protonix but was not taking it at time of her hospital follow-up appointment and confirms that she is still not taking it.  She takes occasional Goody's powders for aches and pains  Assessment & Plan:   Principal Problem:   Rectal bleeding Active Problems:   Essential hypertension   Anemia, unspecified   Syncope   GI bleed   1. Rectal bleeding with acute blood loss anemia  - Presents with painless hematochezia, Hgb down 1g since early August, and she was orthostatic in ED  - She underwent EGD and colonoscopy last month after a similar episode, diverticulosis was noted  -Overnight multiple large bloody stools were noted with  resultant hgb of 5.8 with syncope and hypotension -Pt volume resuscitated and 2 units PRBC's ordered, given -Will f/u repeat h/h -Discussed with GI. Will follow up on bleeding scan that was ordered last night. If bleed can be identified, then possible IR embolization -Per GI, ff bleed cannot be localized, then possibility for repeat colonoscopy to identify source of bleed  2. Syncope; orthostasis  - Patient became acute lightheaded after hematochezia and then had brief LOC when she tried to stand up  - Noted to be orthostatic in ED on initial presentation - Overnight, pt became hypotensive with sbp in the 70's during large bloody stool - Stable currently s/p 2 units PRBC's  3. Hypertension  - Pt presented with acute GIB and orthostasis and antihypertensives were initially held -Hypotensive overnight with bleed -Will hold bp meds for the time being until pt is more hemodynamically stable  4. 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  - Biopsies were taken from the operative site last month at [facility]   DVT prophylaxis: SCD's Code Status: Full Family Communication: Pt in room, family at bedside  Status is: Inpatient  The patient will require care spanning > 2 midnights and should be moved to inpatient because: Hemodynamically unstable, Unsafe d/c plan, IV treatments appropriate due to intensity of illness or inability to take PO and Inpatient level of care appropriate due to severity of illness  Dispo: The patient is from: Home  Anticipated d/c is to: Home              Anticipated d/c date is: 2 days              Patient currently is not medically stable to d/c.       Consultants:   GI  Procedures:     Antimicrobials: Anti-infectives (From admission, onward)   None      Subjective: Large bloody bowel movements overnight  Objective: Vitals:   06/27/20 0815 06/27/20 0853 06/27/20 1057 06/27/20 1141  BP: 121/86  114/84 (!) 134/98 (!) 122/97  Pulse: 74 69 71 83  Resp: 18 18 18 18   Temp: 99 F (37.2 C) 99.7 F (37.6 C) 99.1 F (37.3 C) 99.1 F (37.3 C)  TempSrc: Oral Oral Oral Oral  SpO2: 100% 100% 100% 100%  Weight:      Height:        Intake/Output Summary (Last 24 hours) at 06/27/2020 1238 Last data filed at 06/27/2020 1057 Gross per 24 hour  Intake 2752.27 ml  Output 1550 ml  Net 1202.27 ml   Filed Weights   06/26/20 0321  Weight: 84.8 kg    Examination: General exam: Awake, laying in bed, in nad Respiratory system: Normal respiratory effort, no wheezing Cardiovascular system: regular rate, s1, s2 Gastrointestinal system: Soft, nondistended, positive BS Central nervous system: CN2-12 grossly intact, strength intact Extremities: Perfused, no clubbing Skin: Normal skin turgor, no notable skin lesions seen Psychiatry: Mood normal // no visual hallucinations   Data Reviewed: I have personally reviewed following labs and imaging studies  CBC: Recent Labs  Lab 06/26/20 0334 06/26/20 0832 06/26/20 1221 06/27/20 0257  WBC 7.4  --   --  6.8  NEUTROABS 4.4  --   --   --   HGB 9.2* 8.9* 8.7* 5.8*  HCT 31.1* 30.4* 29.9* 20.0*  MCV 82.9  --   --  84.0  PLT 269  --   --  PLATELET CLUMPS NOTED ON SMEAR, UNABLE TO ESTIMATE   Basic Metabolic Panel: Recent Labs  Lab 06/26/20 0334 06/27/20 1039  NA 138 142  K 3.9 3.2*  CL 107 115*  CO2 22 21*  GLUCOSE 129* 164*  BUN 15 11  CREATININE 0.78 0.86  CALCIUM 8.5* 7.2*   GFR: Estimated Creatinine Clearance: 94 mL/min (by C-G formula based on SCr of 0.86 mg/dL). Liver Function Tests: Recent Labs  Lab 06/26/20 0334  AST 18  ALT 10  ALKPHOS 57  BILITOT 0.1*  PROT 5.8*  ALBUMIN 3.3*   No results for input(s): LIPASE, AMYLASE in the last 168 hours. No results for input(s): AMMONIA in the last 168 hours. Coagulation Profile: Recent Labs  Lab 06/26/20 0334  INR 1.0   Cardiac Enzymes: No results for input(s): CKTOTAL,  CKMB, CKMBINDEX, TROPONINI in the last 168 hours. BNP (last 3 results) No results for input(s): PROBNP in the last 8760 hours. HbA1C: No results for input(s): HGBA1C in the last 72 hours. CBG: Recent Labs  Lab 06/27/20 0223  GLUCAP 169*   Lipid Profile: No results for input(s): CHOL, HDL, LDLCALC, TRIG, CHOLHDL, LDLDIRECT in the last 72 hours. Thyroid Function Tests: No results for input(s): TSH, T4TOTAL, FREET4, T3FREE, THYROIDAB in the last 72 hours. Anemia Panel: No results for input(s): VITAMINB12, FOLATE, FERRITIN, TIBC, IRON, RETICCTPCT in the last 72 hours. Sepsis Labs: No results for input(s): PROCALCITON, LATICACIDVEN in the last 168 hours.  Recent Results (from the past 240 hour(s))  SARS  Coronavirus 2 by RT PCR (hospital order, performed in [facility] hospital lab) Nasopharyngeal Nasopharyngeal Swab     Status: None   Collection Time: 06/26/20  5:57 AM   Specimen: Nasopharyngeal Swab  Result Value Ref Range Status   SARS Coronavirus 2 NEGATIVE NEGATIVE Final    Comment: (NOTE) SARS-CoV-2 target nucleic acids are NOT DETECTED.  The SARS-CoV-2 RNA is generally detectable in upper and lower respiratory specimens during the acute phase of infection. The lowest concentration of SARS-CoV-2 viral copies this assay can detect is 250 copies / mL. A negative result does not preclude SARS-CoV-2 infection and should not be used as the sole basis for treatment or other patient management decisions.  A negative result may occur with improper specimen collection / handling, submission of specimen other than nasopharyngeal swab, presence of viral mutation(s) within the areas targeted by this assay, and inadequate number of viral copies (<250 copies / mL). A negative result must be combined with clinical observations, patient history, and epidemiological information.  Fact Sheet for Patients:   StrictlyIdeas.no  Fact Sheet for Healthcare  Providers: BankingDealers.co.za  This test is not yet approved or  cleared by the [place] FDA and has been authorized for detection and/or diagnosis of SARS-CoV-2 by FDA under an Emergency Use Authorization (EUA).  This EUA will remain in effect (meaning this test can be used) for the duration of the COVID-19 declaration under Section 564(b)(1) of the Act, 21 U.S.C. section 360bbb-3(b)(1), unless the authorization is terminated or revoked sooner.  Performed at [facility], [place] [address] [place], [place] [address]      Radiology Studies: No results found.  Scheduled Meds: . sodium chloride   Intravenous Once  . losartan  50 mg Oral Daily  . sodium chloride flush  3 mL Intravenous Q12H   Continuous Infusions: . sodium chloride    . pantoprozole (PROTONIX) infusion 8 mg/hr (06/27/20 1223)     LOS: 0 days   Marylu Lund, MD [facility] Pager On Amion  If 7PM-7AM, please contact night-coverage 06/27/2020, 12:38 PM

## 2020-06-27 NOTE — Progress Notes (Signed)
Pt's HR up to 140, checked pt in bathroom. Pt assisted back to bed as she c/o lightheaded and weakness. Toilet full of dark red blood. HR down but still tachy at 100. MD notified. Monitoring continues.

## 2020-06-27 NOTE — Progress Notes (Signed)
Bleeding scan negative (prelim reading).  Radiologist suggested that, if the patient bleeds again, to obtain a CT angiogram WITH ARTERIAL AND VENOUS PHASES, as well as oral contrast with VOLUMEN to distend the bowel.  Have restarted diet (per pt's request) and have ordered q12hr CBC.  Cleotis Nipper, M.D. Pager [phone] If no answer or after 5 PM call [phone]

## 2020-06-27 NOTE — Progress Notes (Signed)
Patient complains of sharp, intermittent, pain in RLE. Says it was hurting earlier, but has resolved.

## 2020-06-27 NOTE — Progress Notes (Signed)
Patient admitted yesterday morning with painless hematochezia followed by syncope.   She had another episode of hematochezia this morning followed by syncope. HR was 140 and SBP 70s. She remained tachycardic and hypotensive as fluid bolus was finishing and a second 1 liter NS bolus was started.   HR and BP normalized now and patient reports feeling much better but has had another bloody BM.   CBC is pending. Plan to transfer to progressive unit for closer monitoring, follow-up CBC and transfuse if indicated, check bleeding scan.

## 2020-06-27 NOTE — Progress Notes (Signed)
Have placed IR Consult to facilitate rapid intervention IF nuclear med bleeding scan should come back positive.  Had discussed this algorithm with patient earlier today.  She understands that the bleeding scan may be neg despite recent hematochezia; she also realizes that even if positive and she goes to IR, she might not have active bleeding (and hence opportunity for intervention) by the time she gets on the IR table.  Cleotis Nipper, M.D. Pager [phone] If no answer or after 5 PM call [phone]

## 2020-06-27 NOTE — Significant Event (Addendum)
Rapid Response Event Note   Reason for Call :  Pt with syncopal episode on BSC.  Pt here with GIB. Pt was on BSC and had a large dark red bowel movement with clots . Pt told the RN that she didn't feel well and then had a syncopal episode. Pt was placed back in bed by [facility] RNs and NT.   Initial Focused Assessment:  Pt laying in bed with eyes closed. Pt breathing with a weak pulse, however, not arousable. Pt began to wake up slowly over the next few minutes. She is very lethargic but able to answer questions appropriately, follow commands, and move all extremities. Pupils 5, equal, and sluggish. Lungs clear t/o.  Skin is pale, cool, and clammy. 1450cc dark red bloody bowel movement with clots in BSC.  T-97.7, HR-90, BP>124/94>84/69>76/56>95/72, RR-16, SpO2-99% on RA.   Pt with additional 100cc bloody bowel movement in bedpan.   Interventions:  500cc NS bolus-pt SBP-70s and pt still lethargic after bolus so additional 1L NS ordered. Additional PIV started.  CBC STAT, AM BMP sent as well. CBG-169 Bedrest   Hbg-5.7: 2 units PRBCs ordered Tagged RBC study Tx to PCU([id number]) Plan of Care:  Hgb-5.8(down from 8.7). 2 units PRBCs, tagged red blood cell study, and tx to PCU.   PCU plan of care: Give blood. Take pt to tagged red blood cell study as soon as possible. Bed rest tonight. Monitor pt closely. Call RRT if further assistance needed.   Event Summary:   MD Notified: Dr. Myna Hidalgo notified by bedside RN and came to bedside.  Call [place] End Time:  Dillard Essex, RN

## 2020-06-27 NOTE — Progress Notes (Signed)
Patient refuses CT scan tonight, patient is frustrated with being in the hospital and feels like she isn't getting anywhere, so she says she is not doing the scan. She will only get the blood that's due tonight. She says she has a GI physician at [facility] and that's where she would rather be. RN Notified CT and MD.

## 2020-06-28 LAB — CBC
HCT: 20.3 % — ABNORMAL LOW (ref 36.0–46.0)
HCT: 23.7 % — ABNORMAL LOW (ref 36.0–46.0)
Hemoglobin: 6.6 g/dL — CL (ref 12.0–15.0)
Hemoglobin: 7.6 g/dL — ABNORMAL LOW (ref 12.0–15.0)
MCH: 28.1 pg (ref 26.0–34.0)
MCH: 28.1 pg (ref 26.0–34.0)
MCHC: 32.5 g/dL (ref 30.0–36.0)
MCHC: 32.1 g/dL (ref 30.0–36.0)
MCV: 86.4 fL (ref 80.0–100.0)
MCV: 87.8 fL (ref 80.0–100.0)
Platelets: 154 10*3/uL (ref 150–400)
Platelets: 151 10*3/uL (ref 150–400)
RBC: 2.35 MIL/uL — ABNORMAL LOW (ref 3.87–5.11)
RBC: 2.7 MIL/uL — ABNORMAL LOW (ref 3.87–5.11)
RDW: 16.6 % — ABNORMAL HIGH (ref 11.5–15.5)
RDW: 16 % — ABNORMAL HIGH (ref 11.5–15.5)
WBC: 11.5 10*3/uL — ABNORMAL HIGH (ref 4.0–10.5)
WBC: 11.4 10*3/uL — ABNORMAL HIGH (ref 4.0–10.5)
nRBC: 0 % (ref 0.0–0.2)
nRBC: 0.2 % (ref 0.0–0.2)

## 2020-06-28 LAB — BASIC METABOLIC PANEL
Anion gap: 5 (ref 5–15)
BUN: 8 mg/dL (ref 6–20)
CO2: 21 mmol/L — ABNORMAL LOW (ref 22–32)
Calcium: 7.4 mg/dL — ABNORMAL LOW (ref 8.9–10.3)
Chloride: 114 mmol/L — ABNORMAL HIGH (ref 98–111)
Creatinine, Ser: 0.76 mg/dL (ref 0.44–1.00)
GFR calc Af Amer: >60 mL/min (ref >60)
GFR calc non Af Amer: >60 mL/min (ref >60)
Glucose, Bld: 101 mg/dL — ABNORMAL HIGH (ref 70–99)
Potassium: 3.9 mmol/L (ref 3.5–5.1)
Sodium: 140 mmol/L (ref 135–145)

## 2020-06-28 LAB — HEMOGLOBIN AND HEMATOCRIT, BLOOD
HCT: 23.4 % — ABNORMAL LOW (ref 36.0–46.0)
Hemoglobin: 7.4 g/dL — ABNORMAL LOW (ref 12.0–15.0)

## 2020-06-28 LAB — PREPARE RBC (CROSSMATCH)

## 2020-06-28 MED ORDER — NICOTINE 14 MG/24HR TD PT24
14.0000 mg | MEDICATED_PATCH | Freq: Every day | TRANSDERMAL | Status: DC
  Administered 2020-06-28: 14 mg via TRANSDERMAL
  Filled 2020-06-28: qty 1

## 2020-06-28 MED ORDER — DOCUSATE SODIUM 100 MG PO CAPS: 100.0000 mg | ORAL_CAPSULE | Freq: Two times a day (BID) | ORAL | 0 refills | Status: AC

## 2020-06-28 MED ORDER — SODIUM CHLORIDE 0.9% IV SOLUTION: Freq: Once | INTRAVENOUS | Status: AC

## 2020-06-28 MED ORDER — GUAIFENESIN 100 MG/5ML PO SOLN
5.0000 mL | ORAL | Status: DC | PRN
  Filled 2020-06-28: qty 5

## 2020-06-28 NOTE — Progress Notes (Signed)
Per lab, CBC clotted. Will place another order to redraw.

## 2020-06-28 NOTE — Progress Notes (Signed)
MD paged-patient requests nicotine patch and regular diet (on clear liquids)- says she isn't having any more scans done and wants to eat.
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
## 2020-06-28 NOTE — Progress Notes (Signed)
The patient has been free of any bowel movements or evident bleeding for over 12 hours, since yesterday evening.    Her hemoglobin this morning is 6.6, up only approximately 1 g from yesterday morning, despite a transfusion of 3 units of packed cells since then.  The patient states today she feels better and stronger.  She looks much more relaxed and animated, no distress whatsoever.  She is awaiting a blood transfusion.  I spoke with Dr. Jacqulynn [name] of interventional radiology, who is on-call today.  He had reviewed the patient's prior CT arteriogram from 2 years ago, and in retrospect, he thinks it did show findings compatible with an active diverticular bleed at that time.  The patient is known to have pancolonic scattered diverticulosis, and conversely, no abnormalities in the stomach (status post GIST tumor resection) are noted.  Therefore, and taking into account the character of the patient's bleeding, he is quite confident that her bleeding has been due to diverticular disease, and I am of the same opinion.  I had a fairly extensive discussion with the patient, educating her regarding diverticulosis and its prevalence, potential complications, and the pathophysiology and natural history of diverticular bleeding.  She seemed to comprehend quite well and is appreciative for that information.  I further explained how it is often very difficult to confirm bleeding at the time of a radiographic study, because of the start-and-stop, intermittent character of GI bleeding, in particular diverticular bleeding.  I further explained how diverticular bleeding can recur after a day or even several days of quiescence.  Basically, I offered the patient 2 options:  CT arteriography if she has evidence of further active bleeding, or observation with supportive care if such bleeding occurs.    She understands that, sooner later, the bleeding is likely to stop on its own, so a large portion of the decision  rests on how desirous of patient is of trying to get the bleeding intervened on, with the hope of earlier cessation and earlier hospital discharge, less transfusion requirement, etc.  The patient indicates that at this time, she would like to be discharged later today after her transfusion is complete.  She states she will come back to the hospital if she rebleeds.  I explained that this is not our typical method of management, but rather, that we would like to observe the patient for at least a day or 2 after their last episode of bleeding before they get discharge.  On the other hand, I acknowledged to her that she is young and relatively healthy, for example from the cardiopulmonary standpoint, so it is possible to be somewhat less vigilant about time of discharge for someone like her compared to an elderly, medically fragile individual.  I have discussed the case with the attending hospitalist, Dr. Earlie [name], indicating my opinion that it would be my preference that the patient stay another day, but if she is insistent on going home later today, it would not be reckless to discharge her.  Dr. Paulita [name] is covering for [place] this weekend and I will ask him to round on the patient tomorrow if she is still in the hospital.  Sydney [name], M.D. Pager [phone] If no answer or after 5 PM call [phone]

## 2020-06-28 NOTE — Discharge Summary (Signed)
Physician Discharge Summary  Sydney [name] PRX:[id number] DOB: 1971-08-15 DOA: 06/26/2020  PCP: Andria Frames, PA-C  Admit date: 06/26/2020 Discharge date: 06/28/2020  Admitted From: Home Disposition:  Home  Recommendations for Outpatient Follow-up:  1. Follow up with PCP in 1 week 2. Follow up with primary gastroenterologist within one week  Discharge Condition:Stable CODE STATUS:Full Diet recommendation: Regular   Brief/Interim Summary: 49 y.o.femalewith medical history significant forGI stromal tumor status post resection in 2005, hypertension, and headaches, now presenting to the emergency department with lightheadedness and syncope after an episode of painless hematochezia.Patient reports that she was in her usual state of health when she felt a sudden urge to move her bowels and then saw a large volume of maroon blood in the toilet. She became lightheaded after this and then had a brief loss of consciousness when she went to stand up. She recovered awareness quickly, continued to be lightheaded when sitting up or standing, and came into the ED. She denies any abdominal pain, denies nausea or vomiting, and denies any recent fevers or chills.  She was admitted to [facility] from 05/06/2020 until 05/08/2020 after a very similar episode, underwent EGD with no source of bleeding identified but there were enlarged folds in the cardia that may have reflected postoperative changes and biopsies of this site were taken. She also went colonoscopy that was notable for diverticulosis but no clear bleeding. She was discharged home with a new prescription for Protonix but was not taking it at time of her hospital follow-up appointment and confirms that she is still not taking it. She takes occasional Goody's powders for aches and pains  Discharge Diagnoses:  Principal Problem:   Rectal bleeding Active Problems:   Essential hypertension   Anemia, unspecified   Syncope   GI  bleed  1.Rectal bleeding with acute blood loss anemia -Presents with painless hematochezia, Hgb down 1g since early August, and she was orthostatic in ED -She underwent EGD and colonoscopy last month after a similar episode, diverticulosis was noted -During this visit, multiple large bloody stools were noted with resultant hgb of 5.8 with syncope and hypotension -Pt volume resuscitated patient required multiple blood transfusions -Appreciate assistance by GI and IR, please see their comprehensive documentation. Briefly, concern of intermittent significant diverticular bleed not seen on bleeding scan -Hgb of 6.6 on 9/24 noted, with pt receiving 1 unit of PRBC and post transfusion hgb appropriate at 7.6. Now hemodynamically stable without further bleeding episode -Pt declined CT angio that was ordered on 9/23 and instead is very eager to go home today -Discussed with GI, see documentation by Dr. Cristina Gong. Will discharge today. Pt strongly advised to follow up with her primary GI at [facility] early next week. Pt states she will follow up  2.Syncope; orthostasis -Patient became acute lightheaded after hematochezia and then had brief LOC when she tried to stand up -Noted to be orthostatic in ED on initial presentation - Overnight, pt became hypotensive with sbp in the 70's during large bloody stool - Stable currently s/p 2 units PRBC's  3.Hypertension -Pt presented with acute GIB and orthostasis and antihypertensives were initially held -BP now stable and controlled while off BP meds -Will continue to hold bp meds for the time being until bp is more stable. 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   CORICIDIN HBP COUGH/COLD PO Take 2 tablets by mouth every 4 (four) hours as needed.   docusate sodium 100 MG capsule Commonly known as: Colace Take 1 capsule (100 mg total) by mouth 2 (two) times daily.   nicotine 21 mg/24hr patch Commonly known as: NICODERM CQ - dosed in mg/24 hours Place 1 patch (21 mg total) onto the skin daily.   rizatriptan 10 MG disintegrating tablet Commonly known as: MAXALT-MLT Take 10 mg by mouth daily as needed for headache.   vitamin C 1000 MG tablet Take 500 mg by mouth daily.       Follow-up Information    Park Meo T, PA-C. Schedule an appointment as soon as possible for a visit in 1 week(s).   Specialty: Physician Assistant Contact information: [place] [address] [phone]        Follow up with your primary GI doctor within one week. Schedule an appointment as soon as possible for a visit.              No Known Allergies  Consultations:  GI  IR  Procedures/Studies: NM GI Blood Loss  Result Date: 06/27/2020 CLINICAL DATA:  GI bleeding. EXAM: NUCLEAR MEDICINE GASTROINTESTINAL BLEEDING SCAN TECHNIQUE: Sequential abdominal images were obtained following intravenous administration of Tc-42mlabeled red blood cells. RADIOPHARMACEUTICALS:  Twenty mCi Tc-962mertechnetate in-vitro labeled red cells. COMPARISON:  No recent comparisons are available. FINDINGS: Tagged red blood cell study without signs of abnormal tubular focus moving in antegrade fashion over time in the abdomen. Blood pool and urinary tract as well as faint organ activity in other areas of the abdomen as expected.  IMPRESSION: No visible area of radiotracer accumulation to correlate with GI bleeding. Electronically Signed   By: GeZetta Bills.D.   On: 06/27/2020 17:09     Subjective: Very eager to go home  Discharge Exam: Vitals:   06/28/20 1250 06/28/20 1328  BP: 121/82 118/81  Pulse: 84 82  Resp: 17 18  Temp: 98.4 F (36.9 C) 99.3 F (37.4 C)  SpO2: 100% 100%   Vitals:   06/28/20 1114 06/28/20 1150 06/28/20 1250 06/28/20 1328  BP: 121/86 (!) 125/97 121/82 118/81  Pulse: 77 78 84 82  Resp: 18 18 17 18   Temp: 99.6 F (37.6 C) 99.6 F (37.6 C) 98.4 F (36.9 C) 99.3 F (37.4 C)  TempSrc: Oral Oral  Oral  SpO2: 100% 100% 100% 100%  Weight:      Height:        General: Pt is alert, awake, not in acute distress Cardiovascular: RRR, S1/S2 +, no rubs, no gallops Respiratory: CTA bilaterally, no wheezing, no rhonchi Abdominal: Soft, NT, ND, bowel sounds + Extremities: no edema, no cyanosis   The results of significant diagnostics from this hospitalization (including imaging, microbiology, ancillary and laboratory) are listed below for reference.     Microbiology: Recent Results (from the past 240 hour(s))  SARS Coronavirus 2 by RT PCR (hospital  order, performed in [facility] hospital lab) Nasopharyngeal Nasopharyngeal Swab     Status: None   Collection Time: 06/26/20  5:57 AM   Specimen: Nasopharyngeal Swab  Result Value Ref Range Status   SARS Coronavirus 2 NEGATIVE NEGATIVE Final    Comment: (NOTE) SARS-CoV-2 target nucleic acids are NOT DETECTED.  The SARS-CoV-2 RNA is generally detectable in upper and lower respiratory specimens during the acute phase of infection. The lowest concentration of SARS-CoV-2 viral copies this assay can detect is 250 copies / mL. A negative result does not preclude SARS-CoV-2 infection and should not be used as the sole basis for treatment or other patient management decisions.  A negative result may occur with improper specimen collection /  handling, submission of specimen other than nasopharyngeal swab, presence of viral mutation(s) within the areas targeted by this assay, and inadequate number of viral copies (<250 copies / mL). A negative result must be combined with clinical observations, patient history, and epidemiological information.  Fact Sheet for Patients:   StrictlyIdeas.no  Fact Sheet for Healthcare Providers: BankingDealers.co.za  This test is not yet approved or  cleared by the [place] FDA and has been authorized for detection and/or diagnosis of SARS-CoV-2 by FDA under an Emergency Use Authorization (EUA).  This EUA will remain in effect (meaning this test can be used) for the duration of the COVID-19 declaration under Section 564(b)(1) of the Act, 21 U.S.C. section 360bbb-3(b)(1), unless the authorization is terminated or revoked sooner.  Performed at [facility], [place] [address], [place], [place] [address]      Labs: BNP (last 3 results) No results for input(s): BNP in the last 8760 hours. Basic Metabolic Panel: Recent Labs  Lab 06/26/20 0334 06/27/20 1039 06/28/20 0541  NA 138 142 140  K 3.9 3.2* 3.9  CL 107 115* 114*  CO2 22 21* 21*  GLUCOSE 129* 164* 101*  BUN 15 11 8   CREATININE 0.78 0.86 0.76  CALCIUM 8.5* 7.2* 7.4*   Liver Function Tests: Recent Labs  Lab 06/26/20 0334  AST 18  ALT 10  ALKPHOS 57  BILITOT 0.1*  PROT 5.8*  ALBUMIN 3.3*   No results for input(s): LIPASE, AMYLASE in the last 168 hours. No results for input(s): AMMONIA in the last 168 hours. CBC: Recent Labs  Lab 06/26/20 0334 06/26/20 0832 06/27/20 0257 06/27/20 1617 06/27/20 1724 06/28/20 0901 06/28/20 1558  WBC 7.4  --  6.8  --  12.6* 11.5* 11.4*  NEUTROABS 4.4  --   --   --   --   --   --   HGB 9.2*   < > 5.8* 6.5* 6.2* 6.6* 7.6*  7.4*  HCT 31.1*   < > 20.0* 21.3* 19.9* 20.3* 23.7*  23.4*  MCV 82.9  --  84.0  --  84.7 86.4 87.8  PLT  269  --  PLATELET CLUMPS NOTED ON SMEAR, UNABLE TO ESTIMATE  --  154 154 151   < > = values in this interval not displayed.   Cardiac Enzymes: No results for input(s): CKTOTAL, CKMB, CKMBINDEX, TROPONINI in the last 168 hours. BNP: Invalid input(s): POCBNP CBG: Recent Labs  Lab 06/27/20 0223  GLUCAP 169*   D-Dimer No results for input(s): DDIMER in the last 72 hours. Hgb A1c No results for input(s): HGBA1C in the last 72 hours. Lipid Profile No results for input(s): CHOL, HDL, LDLCALC, TRIG, CHOLHDL, LDLDIRECT in the last 72 hours. Thyroid function studies No results for input(s):  TSH, T4TOTAL, T3FREE, THYROIDAB in the last 72 hours.  Invalid input(s): FREET3 Anemia work up No results for input(s): VITAMINB12, FOLATE, FERRITIN, TIBC, IRON, RETICCTPCT in the last 72 hours. Urinalysis    Component Value Date/Time   COLORURINE YELLOW 08/24/2018 0820   APPEARANCEUR HAZY (A) 08/24/2018 0820   LABSPEC 1.023 08/24/2018 0820   PHURINE 5.0 08/24/2018 0820   GLUCOSEU NEGATIVE 08/24/2018 0820   HGBUR MODERATE (A) 08/24/2018 0820   BILIRUBINUR NEGATIVE 08/24/2018 0820   BILIRUBINUR NEG 10/31/2014 1518   KETONESUR 5 (A) 08/24/2018 0820   PROTEINUR NEGATIVE 08/24/2018 0820   UROBILINOGEN 0.2 12/20/2014 1000   NITRITE NEGATIVE 08/24/2018 0820   LEUKOCYTESUR NEGATIVE 08/24/2018 0820   Sepsis Labs Invalid input(s): PROCALCITONIN,  WBC,  LACTICIDVEN Microbiology Recent Results (from the past 240 hour(s))  SARS Coronavirus 2 by RT PCR (hospital order, performed in [place] hospital lab) Nasopharyngeal Nasopharyngeal Swab     Status: None   Collection Time: 06/26/20  5:57 AM   Specimen: Nasopharyngeal Swab  Result Value Ref Range Status   SARS Coronavirus 2 NEGATIVE NEGATIVE Final    Comment: (NOTE) SARS-CoV-2 target nucleic acids are NOT DETECTED.  The SARS-CoV-2 RNA is generally detectable in upper and lower respiratory specimens during the acute phase of infection. The  lowest concentration of SARS-CoV-2 viral copies this assay can detect is 250 copies / mL. A negative result does not preclude SARS-CoV-2 infection and should not be used as the sole basis for treatment or other patient management decisions.  A negative result may occur with improper specimen collection / handling, submission of specimen other than nasopharyngeal swab, presence of viral mutation(s) within the areas targeted by this assay, and inadequate number of viral copies (<250 copies / mL). A negative result must be combined with clinical observations, patient history, and epidemiological information.  Fact Sheet for Patients:   StrictlyIdeas.no  Fact Sheet for Healthcare Providers: BankingDealers.co.za  This test is not yet approved or  cleared by the [place] FDA and has been authorized for detection and/or diagnosis of SARS-CoV-2 by FDA under an Emergency Use Authorization (EUA).  This EUA will remain in effect (meaning this test can be used) for the duration of the COVID-19 declaration under Section 564(b)(1) of the Act, 21 U.S.C. section 360bbb-3(b)(1), unless the authorization is terminated or revoked sooner.  Performed at [facility], [place] [address] [place], [place] [address]    Time spent: 70mn  SIGNED:   SMarylu Lund MD  [facility] 06/28/2020, 4:59 PM  If 7PM-7AM, please contact night-coverage

## 2020-06-29 LAB — TYPE AND SCREEN
ABO/RH(D): O POS
Antibody Screen: NEGATIVE
Unit division: 0
Unit division: 0
Unit division: 0
Unit division: 0

## 2020-06-29 LAB — BPAM RBC
Blood Product Expiration Date: 202110112359
Blood Product Expiration Date: 202110132359
Blood Product Expiration Date: 202110262359
Blood Product Expiration Date: 202110272359
ISSUE DATE / TIME: 202109230437
ISSUE DATE / TIME: 202109230829
ISSUE DATE / TIME: 202109232011
ISSUE DATE / TIME: 202109241129
Unit Type and Rh: 5100
Unit Type and Rh: 5100
Unit Type and Rh: 5100
Unit Type and Rh: 5100

## 2020-12-21 ENCOUNTER — Other Ambulatory Visit: Payer: Self-pay

## 2020-12-21 ENCOUNTER — Emergency Department (HOSPITAL_COMMUNITY)
Admission: EM | Admit: 2020-12-21 | Discharge: 2020-12-21 | Disposition: A | Discharge status: Home / Self Care | Payer: 59 | Attending: Emergency Medicine | Admitting: Emergency Medicine

## 2020-12-21 ENCOUNTER — Emergency Department (HOSPITAL_COMMUNITY): Payer: 59

## 2020-12-21 ENCOUNTER — Encounter (HOSPITAL_COMMUNITY): Payer: Self-pay | Admitting: Emergency Medicine

## 2020-12-21 DIAGNOSIS — W01198A Fall on same level from slipping, tripping and stumbling with subsequent striking against other object, initial encounter: Secondary | ICD-10-CM | POA: Insufficient documentation

## 2020-12-21 DIAGNOSIS — S8011XA Contusion of right lower leg, initial encounter: Secondary | ICD-10-CM | POA: Diagnosis not present

## 2020-12-21 DIAGNOSIS — S8991XA Unspecified injury of right lower leg, initial encounter: Secondary | ICD-10-CM | POA: Diagnosis present

## 2020-12-21 DIAGNOSIS — Z85038 Personal history of other malignant neoplasm of large intestine: Secondary | ICD-10-CM | POA: Diagnosis not present

## 2020-12-21 DIAGNOSIS — Y92009 Unspecified place in unspecified non-institutional (private) residence as the place of occurrence of the external cause: Secondary | ICD-10-CM | POA: Insufficient documentation

## 2020-12-21 DIAGNOSIS — Y9301 Activity, walking, marching and hiking: Secondary | ICD-10-CM | POA: Insufficient documentation

## 2020-12-21 DIAGNOSIS — I10 Essential (primary) hypertension: Secondary | ICD-10-CM | POA: Insufficient documentation

## 2020-12-21 DIAGNOSIS — F17210 Nicotine dependence, cigarettes, uncomplicated: Secondary | ICD-10-CM | POA: Insufficient documentation

## 2020-12-21 MED ORDER — HYDROCODONE-ACETAMINOPHEN 5-325 MG PO TABS: 1.0000 | ORAL_TABLET | Freq: Four times a day (QID) | ORAL | 0 refills | Status: DC | PRN

## 2020-12-21 MED ORDER — OXYCODONE-ACETAMINOPHEN 5-325 MG PO TABS
1.0000 | ORAL_TABLET | Freq: Once | ORAL | Status: AC
  Administered 2020-12-21: 1 via ORAL
  Filled 2020-12-21: qty 1

## 2020-12-21 NOTE — ED Notes (Signed)
Ortho tech notified.

## 2020-12-21 NOTE — ED Notes (Signed)
Pt states she is seeing her pcp on Monday for bp meds.

## 2020-12-21 NOTE — Progress Notes (Signed)
Orthopedic Tech Progress Note Patient Details:  LAKYA SCHRUPP [birth year] [id number]  Ortho Devices Type of Ortho Device: Ace wrap,Crutches Ortho Device/Splint Location: RLE Ortho Device/Splint Interventions: Ordered,Adjustment,Application   Post Interventions Patient Tolerated: Well Instructions Provided: Poper ambulation with device,Adjustment of device   Todd Argabright A Paetyn Pietrzak 12/21/2020, 10:40 AM

## 2020-12-21 NOTE — ED Notes (Signed)
Patient verbalizes understanding of discharge instructions. Opportunity for questioning and answers were provided. Armband removed by staff, pt discharged from ED.  
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
## 2020-12-21 NOTE — ED Triage Notes (Signed)
Patient fell at home this morning reports pain and swelling at right lower leg radiating down to her foot.

## 2020-12-21 NOTE — Discharge Instructions (Signed)
You have been evaluated for your right lower extremity injury.  You do have a large bruise about your shin.  Please apply ice pack intermittently throughout the day to help decrease swelling.  Keep your leg elevated.  Use crutches as needed.  Take pain medication for better pain control but please be aware it can cause drowsiness.  If you develop significant numbness, or foot pain when you move your ankle then please return to ER for further evaluation.

## 2020-12-23 ENCOUNTER — Other Ambulatory Visit: Payer: Self-pay | Admitting: Nurse Practitioner

## 2020-12-23 ENCOUNTER — Other Ambulatory Visit: Payer: Self-pay

## 2020-12-23 DIAGNOSIS — S8991XA Unspecified injury of right lower leg, initial encounter: Secondary | ICD-10-CM

## 2020-12-23 DIAGNOSIS — T148XXA Other injury of unspecified body region, initial encounter: Secondary | ICD-10-CM

## 2020-12-25 ENCOUNTER — Other Ambulatory Visit: Payer: 59

## 2020-12-26 ENCOUNTER — Other Ambulatory Visit: Payer: Self-pay | Admitting: Nurse Practitioner

## 2020-12-26 ENCOUNTER — Ambulatory Visit
Admission: RE | Admit: 2020-12-26 | Discharge: 2020-12-26 | Disposition: A | Discharge status: Home / Self Care | Payer: 59 | Source: Ambulatory Visit | Attending: Nurse Practitioner | Admitting: Nurse Practitioner

## 2020-12-26 DIAGNOSIS — S8991XA Unspecified injury of right lower leg, initial encounter: Secondary | ICD-10-CM

## 2020-12-26 DIAGNOSIS — T148XXA Other injury of unspecified body region, initial encounter: Secondary | ICD-10-CM

## 2021-03-05 ENCOUNTER — Other Ambulatory Visit: Payer: Self-pay

## 2021-03-05 ENCOUNTER — Ambulatory Visit (HOSPITAL_COMMUNITY)
Admission: EM | Admit: 2021-03-05 | Discharge: 2021-03-05 | Disposition: A | Discharge status: Home / Self Care | Payer: 59

## 2021-03-05 ENCOUNTER — Encounter (HOSPITAL_COMMUNITY): Payer: Self-pay

## 2021-03-05 DIAGNOSIS — H1031 Unspecified acute conjunctivitis, right eye: Secondary | ICD-10-CM

## 2021-03-05 MED ORDER — POLYMYXIN B-TRIMETHOPRIM 10000-0.1 UNIT/ML-% OP SOLN: 1.0000 [drp] | OPHTHALMIC | 0 refills | Status: DC
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
## 2021-03-05 NOTE — ED Triage Notes (Signed)
Friday pt noticed tenderness under right eyelid. Eye completely swollen yesterday with drainage redness and soreness. Pt reports large area surrounding and beneath eye was swollen yesterday. Eyelid still swollen but somewhat improved today. Reports intermittent blurring to vision in right eye.

## 2021-03-05 NOTE — Discharge Instructions (Addendum)
Place 1 drop in right eye every 4 hours for the next 3-5 days.   Follow up with eye doctor if symptoms persist after use of medication

## 2021-05-01 ENCOUNTER — Ambulatory Visit (HOSPITAL_COMMUNITY)
Admission: EM | Admit: 2021-05-01 | Discharge: 2021-05-01 | Disposition: A | Discharge status: Home / Self Care | Payer: 59 | Attending: Emergency Medicine | Admitting: Emergency Medicine

## 2021-05-01 ENCOUNTER — Encounter (HOSPITAL_COMMUNITY): Payer: Self-pay

## 2021-05-01 ENCOUNTER — Other Ambulatory Visit: Payer: Self-pay

## 2021-05-01 DIAGNOSIS — M5441 Lumbago with sciatica, right side: Secondary | ICD-10-CM | POA: Diagnosis not present

## 2021-05-01 LAB — POCT URINALYSIS DIPSTICK, ED / UC
Bilirubin Urine: NEGATIVE
Glucose, UA: NEGATIVE mg/dL
Hgb urine dipstick: NEGATIVE
Leukocytes,Ua: NEGATIVE
Nitrite: NEGATIVE
Protein, ur: NEGATIVE mg/dL
Specific Gravity, Urine: 1.025 (ref 1.005–1.030)
Urobilinogen, UA: 0.2 mg/dL (ref 0.0–1.0)
pH: 5 (ref 5.0–8.0)

## 2021-05-01 MED ORDER — KETOROLAC TROMETHAMINE 30 MG/ML IJ SOLN
30.0000 mg | Freq: Once | INTRAMUSCULAR | Status: AC
  Administered 2021-05-01: 30 mg via INTRAMUSCULAR

## 2021-05-01 MED ORDER — KETOROLAC TROMETHAMINE 30 MG/ML IJ SOLN
INTRAMUSCULAR | Status: AC
  Filled 2021-05-01: qty 1

## 2021-05-01 MED ORDER — PREDNISONE 10 MG (21) PO TBPK: ORAL_TABLET | Freq: Every day | ORAL | 0 refills | Status: DC
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
## 2021-05-01 NOTE — Discharge Instructions (Addendum)
You were given ketorolac injection in office today for pain and inflammation.  Please do not start prednisone taper until tomorrow due to receiving ketorolac injection in office today.  Please also avoid any over-the-counter ibuprofen, Advil, Aleve while taking prednisone.  You may take Tylenol as needed for pain.  If pain does not resolve in the next 1 to 2 weeks, please follow-up with provided contact information for orthopedic sports medicine for further evaluation and management.

## 2021-05-01 NOTE — ED Triage Notes (Signed)
Pt reports left sided lower back pain and right leg pain x 1 week.   Pt requested urine test.

## 2021-05-03 LAB — URINE CULTURE

## 2021-08-12 ENCOUNTER — Other Ambulatory Visit: Payer: Self-pay | Admitting: Orthopedic Surgery

## 2021-09-04 ENCOUNTER — Inpatient Hospital Stay: Admit: 2021-09-04 | Payer: 59 | Admitting: Orthopedic Surgery

## 2021-09-04 SURGERY — TRANSFORAMINAL LUMBAR INTERBODY FUSION (TLIF) WITH PEDICLE SCREW FIXATION 1 LEVEL
Anesthesia: General | Laterality: Left

## 2021-12-01 ENCOUNTER — Encounter (HOSPITAL_COMMUNITY): Payer: Self-pay

## 2021-12-01 ENCOUNTER — Observation Stay (HOSPITAL_COMMUNITY)
Admission: EM | Admit: 2021-12-01 | Discharge: 2021-12-01 | Disposition: A | Discharge status: Home / Self Care | Payer: 59 | Attending: Internal Medicine | Admitting: Internal Medicine

## 2021-12-01 ENCOUNTER — Other Ambulatory Visit: Payer: Self-pay

## 2021-12-01 DIAGNOSIS — Z85020 Personal history of malignant carcinoid tumor of stomach: Secondary | ICD-10-CM | POA: Diagnosis not present

## 2021-12-01 DIAGNOSIS — I10 Essential (primary) hypertension: Secondary | ICD-10-CM | POA: Diagnosis not present

## 2021-12-01 DIAGNOSIS — Z903 Acquired absence of stomach [part of]: Secondary | ICD-10-CM | POA: Insufficient documentation

## 2021-12-01 DIAGNOSIS — K921 Melena: Secondary | ICD-10-CM

## 2021-12-01 DIAGNOSIS — Z79899 Other long term (current) drug therapy: Secondary | ICD-10-CM | POA: Diagnosis not present

## 2021-12-01 DIAGNOSIS — F17210 Nicotine dependence, cigarettes, uncomplicated: Secondary | ICD-10-CM | POA: Diagnosis not present

## 2021-12-01 DIAGNOSIS — K922 Gastrointestinal hemorrhage, unspecified: Secondary | ICD-10-CM | POA: Diagnosis not present

## 2021-12-01 DIAGNOSIS — Z20822 Contact with and (suspected) exposure to covid-19: Secondary | ICD-10-CM | POA: Diagnosis not present

## 2021-12-01 LAB — COMPREHENSIVE METABOLIC PANEL
ALT: 13 U/L (ref 0–44)
AST: 16 U/L (ref 15–41)
Albumin: 3.2 g/dL — ABNORMAL LOW (ref 3.5–5.0)
Alkaline Phosphatase: 60 U/L (ref 38–126)
Anion gap: 3 — ABNORMAL LOW (ref 5–15)
BUN: 21 mg/dL — ABNORMAL HIGH (ref 6–20)
CO2: 26 mmol/L (ref 22–32)
Calcium: 8.4 mg/dL — ABNORMAL LOW (ref 8.9–10.3)
Chloride: 109 mmol/L (ref 98–111)
Creatinine, Ser: 0.83 mg/dL (ref 0.44–1.00)
GFR, Estimated: >60 mL/min (ref >60)
Glucose, Bld: 113 mg/dL — ABNORMAL HIGH (ref 70–99)
Potassium: 3.8 mmol/L (ref 3.5–5.1)
Sodium: 138 mmol/L (ref 135–145)
Total Bilirubin: 0.3 mg/dL (ref 0.3–1.2)
Total Protein: 5.9 g/dL — ABNORMAL LOW (ref 6.5–8.1)

## 2021-12-01 LAB — CBC WITH DIFFERENTIAL/PLATELET
Abs Immature Granulocytes: 0.02 10*3/uL (ref 0.00–0.07)
Basophils Absolute: 0 10*3/uL (ref 0.0–0.1)
Basophils Relative: 1 %
Eosinophils Absolute: 0.2 10*3/uL (ref 0.0–0.5)
Eosinophils Relative: 3 %
HCT: 34.4 % — ABNORMAL LOW (ref 36.0–46.0)
Hemoglobin: 11 g/dL — ABNORMAL LOW (ref 12.0–15.0)
Immature Granulocytes: 0 %
Lymphocytes Relative: 29 %
Lymphs Abs: 2.1 10*3/uL (ref 0.7–4.0)
MCH: 29.8 pg (ref 26.0–34.0)
MCHC: 32 g/dL (ref 30.0–36.0)
MCV: 93.2 fL (ref 80.0–100.0)
Monocytes Absolute: 0.5 10*3/uL (ref 0.1–1.0)
Monocytes Relative: 7 %
Neutro Abs: 4.4 10*3/uL (ref 1.7–7.7)
Neutrophils Relative %: 60 %
Platelets: 244 10*3/uL (ref 150–400)
RBC: 3.69 MIL/uL — ABNORMAL LOW (ref 3.87–5.11)
RDW: 14 % (ref 11.5–15.5)
WBC: 7.2 10*3/uL (ref 4.0–10.5)
nRBC: 0 % (ref 0.0–0.2)

## 2021-12-01 LAB — PROTIME-INR
INR: 1 (ref 0.8–1.2)
Prothrombin Time: 13 seconds (ref 11.4–15.2)

## 2021-12-01 LAB — HIV ANTIBODY (ROUTINE TESTING W REFLEX): HIV Screen 4th Generation wRfx: NONREACTIVE

## 2021-12-01 LAB — RESP PANEL BY RT-PCR (FLU A&B, COVID) ARPGX2
Influenza A by PCR: NEGATIVE
Influenza B by PCR: NEGATIVE
SARS Coronavirus 2 by RT PCR: NEGATIVE

## 2021-12-01 LAB — I-STAT BETA HCG BLOOD, ED (NOT ORDERABLE): I-stat hCG, quantitative: <5 m[IU]/mL (ref <5)

## 2021-12-01 LAB — HEMOGLOBIN
Hemoglobin: 10.2 g/dL — ABNORMAL LOW (ref 12.0–15.0)
Hemoglobin: 10.1 g/dL — ABNORMAL LOW (ref 12.0–15.0)

## 2021-12-01 LAB — TYPE AND SCREEN
ABO/RH(D): O POS
Antibody Screen: NEGATIVE

## 2021-12-01 LAB — HEMATOCRIT: HCT: 31.7 % — ABNORMAL LOW (ref 36.0–46.0)

## 2021-12-01 MED ORDER — SODIUM CHLORIDE 0.9 % IV BOLUS
1000.0000 mL | Freq: Once | INTRAVENOUS | Status: AC
  Administered 2021-12-01: 1000 mL via INTRAVENOUS

## 2021-12-01 MED ORDER — LIP MEDEX EX OINT
TOPICAL_OINTMENT | CUTANEOUS | Status: DC | PRN
  Filled 2021-12-01: qty 7

## 2021-12-01 MED ORDER — ACETAMINOPHEN 650 MG RE SUPP: 650.0000 mg | Freq: Four times a day (QID) | RECTAL | Status: DC | PRN

## 2021-12-01 MED ORDER — ONDANSETRON HCL 4 MG PO TABS: 4.0000 mg | ORAL_TABLET | Freq: Four times a day (QID) | ORAL | Status: DC | PRN

## 2021-12-01 MED ORDER — DOCUSATE SODIUM 100 MG PO CAPS: 100.0000 mg | ORAL_CAPSULE | Freq: Every day | ORAL | 2 refills | Status: AC

## 2021-12-01 MED ORDER — SODIUM CHLORIDE 0.9% FLUSH
3.0000 mL | Freq: Two times a day (BID) | INTRAVENOUS | Status: DC
  Administered 2021-12-01: 3 mL via INTRAVENOUS

## 2021-12-01 MED ORDER — ACETAMINOPHEN 325 MG PO TABS: 650.0000 mg | ORAL_TABLET | Freq: Four times a day (QID) | ORAL | Status: DC | PRN

## 2021-12-01 MED ORDER — LACTATED RINGERS IV SOLN: INTRAVENOUS | Status: AC

## 2021-12-01 MED ORDER — ONDANSETRON HCL 4 MG/2ML IJ SOLN: 4.0000 mg | Freq: Four times a day (QID) | INTRAMUSCULAR | Status: DC | PRN

## 2021-12-01 NOTE — Hospital Course (Signed)
Sydney [name] is a pleasant 50 y.o. female with medical history significant for GI stromal tumor status-post partial gastrectomy in 2005, hypertension, and diverticulosis, presented to the emergency department with painless hematochezia and near syncope.  Patient reported urgency to defecate followed by passage of dark red blood on 11/29/2021 followed by next episode with near syncopal event.  No abdominal pain.  History of GI bleed in 2019 and 2021 and history of gastritis and H. pylori.  Patient did have upper endoscopy and colonoscopy in the past.  In the ED patient was hemodynamically stable.  Hemoglobin of 11.0.  Type and screen was performed and patient was given 1 L of IV fluids and was admitted hospital for further evaluation and treatment.

## 2021-12-01 NOTE — Assessment & Plan Note (Addendum)
Patient did have a near syncopal episode on presentation.  Received IV fluids.  Asymptomatic at this time.

## 2021-12-01 NOTE — Plan of Care (Signed)
  Problem: Education: Goal: Knowledge of General Education information will improve Description Including pain rating scale, medication(s)/side effects and non-pharmacologic comfort measures Outcome: Progressing   Problem: Health Behavior/Discharge Planning: Goal: Ability to manage health-related needs will improve Outcome: Progressing   

## 2021-12-01 NOTE — ED Triage Notes (Signed)
Pt. BIB [facility] c/o GI bleeding. She states that she started having bloody diarrhea Saturday night and she figured it would go away on it's own. She said that she started to feel dizzy and felt like she was fainting. She has been treated at [facility] before for the same issue where she said that she had to have 6 units of blood. This was 8 months ago.   EMS VS: BP: 114/88 HR: 82 O2: 100% RR:18

## 2021-12-01 NOTE — Assessment & Plan Note (Addendum)
Possibly diverticular.  Painless hematochezia.  Hemoglobin is 10.1 today from 10.2.  Was 15.2 in August 2022.  Likely hemodilution as well.  Hemodynamically remained stable.  Patient stated that she had some family emergency and had to leave today.  However she has not had further bright red blood per rectum and hemoglobin has pretty much remained stable.  I have advised her to seek medical attention for ongoing bleeding.
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
## 2021-12-01 NOTE — ED Provider Notes (Signed)
Sydney [name]   CSN: [id number] Arrival date & time: 12/01/21  0108     History  Chief Complaint  Patient presents with   GI Bleeding    Sydney [name] is a 51 y.o. female.  The history is provided by the patient and medical records.  Sydney [name] is a 51 y.o. female who presents to the Emergency Department complaining of GI bleeding.  On Saturday she developed the urge to defecate and had a large amount of dark blood mixed with stool.  Today she had a second similar episode but felt near-syncopal at the time. At time of ED arrival feels improved.  Has previously required blood transfusions due to GI bleed.  No current abdominal pain but had cramping just prior to her BM.    Hx/o GIST s/p resection 2005.  Hx/o HTN.   No chance of pregnancy.  Currently taking amoxicillin for bartholins cyst for about a week.         Home Medications Prior to Admission medications   Medication Sig Start Date End Date Taking? Authorizing Provider  acetaminophen (TYLENOL) 325 MG tablet Take 2 tablets (650 mg total) by mouth every 6 (six) hours as needed for headache. 08/28/18   Glenis Smoker, MD  amLODipine (NORVASC) 5 MG tablet Take 5 mg by mouth daily.    [provider]  Ascorbic Acid (VITAMIN C) 1000 MG tablet Take 500 mg by mouth daily.    [provider]  Chlorpheniramine-DM (CORICIDIN HBP COUGH/COLD PO) Take 2 tablets by mouth every 4 (four) hours as needed.    [provider]  HYDROcodone-acetaminophen (NORCO/VICODIN) 5-325 MG tablet Take 1 tablet by mouth every 6 (six) hours as needed for moderate pain or severe pain. 12/21/20   Domenic Moras, PA-C  losartan (COZAAR) 100 MG tablet Take 100 mg by mouth daily.    [provider]  nicotine (NICODERM CQ - DOSED IN MG/24 HOURS) 21 mg/24hr patch Place 1 patch (21 mg total) onto the skin daily. Patient not taking: No sig reported 08/28/18   Glenis Smoker, MD  predniSONE (STERAPRED UNI-PAK 21 TAB) 10 MG (21) TBPK tablet Take by mouth daily. Take 6 tabs by mouth daily  for 2 days, then 5 tabs for 2 days, then 4 tabs for 2 days, then 3 tabs for 2 days, 2 tabs for 2 days, then 1 tab by mouth daily for 2 days 05/01/21   Teodora Medici, FNP  rizatriptan (MAXALT-MLT) 10 MG disintegrating tablet Take 10 mg by mouth daily as needed for headache. 05/10/20   [provider]  trimethoprim-polymyxin b (POLYTRIM) ophthalmic solution Place 1 drop into the right eye every 4 (four) hours. 03/05/21   White, Leitha Schuller, NP  VITAMIN D, CHOLECALCIFEROL, PO Take by mouth.    [provider]      Allergies    Patient has no known allergies.    Review of Systems   Review of Systems  All other systems reviewed and are negative.  Physical Exam Updated Vital Signs BP (!) 123/96    Pulse 70    Temp 97.6 F (36.4 C) (Oral)    Resp 14    LMP 04/26/2016    SpO2 99%  Physical Exam Vitals and nursing [name] reviewed.  Constitutional:      Appearance: She is well-developed.  HENT:     Head: Normocephalic and atraumatic.  Cardiovascular:     Rate and Rhythm: Normal  rate and regular rhythm.     Heart sounds: No murmur heard. Pulmonary:     Effort: Pulmonary effort is normal. No respiratory distress.     Breath sounds: Normal breath sounds.  Abdominal:     Palpations: Abdomen is soft.     Tenderness: There is no abdominal tenderness. There is no guarding or rebound.  Genitourinary:    Comments: Gross dark red blood on rectal examination Musculoskeletal:        General: No tenderness.  Skin:    General: Skin is warm and dry.  Neurological:     Mental Status: She is alert and oriented to person, place, and time.  Psychiatric:        Behavior: Behavior normal.    ED Results / Procedures / Treatments   Labs (all labs ordered are listed, but only abnormal results are displayed) Labs Reviewed - No data to  display  EKG None  Radiology No results found.  Procedures Procedures    Medications Ordered in ED Medications - No data to display  ED Course/ Medical Decision Making/ A&P                           Medical Decision Making Amount and/or Complexity of Data Reviewed Labs: ordered.  Risk Decision regarding hospitalization.   Pt with hx/o hpylori, diverticulosis, internal hemorrhoids with recurrent GI bleed here for evaluation of hematochezia x 2 over the last two days.  On record review pt has required multiple blood transfusions for lower GI bleed in 2021.  In Aug 2022 she had a hemoglobin of 15.2. On exam she has gross blood in the rectal vault.  No recurrent bleeding during ED stay but states she feels like she may need to have another BM soon.  Given pt's hx recommend observation for GI bleed.  Hgb today down to 11 from 15 in August.  D/w pt findings of studies and recommendation for admission and she is in agreement with treatment plan.  Hospitalist consulted for admission.          Final Clinical Impression(s) / ED Diagnoses Final diagnoses:  None    Rx / DC Orders ED Discharge Orders     None         Quintella Reichert, MD 12/01/21 [phone]

## 2021-12-01 NOTE — Progress Notes (Signed)
°  Transition of Care [facility]) Screening Note   Patient Details  Name: Sydney [name] Date of Birth: 12-05-1970   [facility] CM/SW Contact:    Dessa Phi, RN Phone Number: 12/01/2021, 10:03 AM    Transition of Care Department [facility]) has reviewed patient and no TOC needs have been identified at this time. We will continue to monitor patient advancement through interdisciplinary progression rounds. If new patient transition needs arise, please place a TOC consult.
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
## 2021-12-01 NOTE — Progress Notes (Signed)
PROGRESS NOTE    Sydney [name]  YQI:[id number] DOB: 05-Dec-1970 DOA: 12/01/2021 PCP: Gerald Leitz    Brief Narrative:  Sydney [name] is a pleasant 51 y.o. female with medical history significant for GI stromal tumor status-post partial gastrectomy in 2005, hypertension, and diverticulosis, presented to the emergency department with painless hematochezia and near syncope.  Patient reported urgency to defecate followed by passage of dark red blood on 11/29/2021 followed by next episode with near syncopal event.  No abdominal pain.  History of GI bleed in 2019 and 2021 and history of gastritis and H. pylori.  Patient did have upper endoscopy and colonoscopy in the past.  In the ED patient was hemodynamically stable.  Hemoglobin of 11.0.  Type and screen was performed and patient was given 1 L of IV fluids and was admitted hospital for further evaluation and treatment.       Assessment and Plan: * Acute lower GI bleeding- (present on admission) Possibly diverticular.  Painless hematochezia.  Hemoglobin is 11.0 down from 15.2 in August 2022.  Hemodynamically stable.  Continue to monitor closely.  Transfuse as necessary.  Essential hypertension- (present on admission) Patient did have a near syncopal episode.  Will hold antihypertensives for now.    DVT prophylaxis: SCDs Start: 12/01/21 0444   Code Status:     Code Status: Full Code  Disposition: Home Status is: Observation The patient will require care spanning > 2 midnights and should be moved to inpatient because: GI bleeding follow-up.    Family Communication: Spoke with the patient at bedside  Consultants:  GI  Procedures:  None yet  Antimicrobials:  None  Anti-infectives (From admission, onward)    None      Subjective: Today, patient was seen and examined at bedside.  Patient denies any abdominal pain, nausea vomiting.  Has not had a further rectal bleed since 1 AM this morning.  Has mild chills.   No nausea vomiting fever   Objective: Vitals:   12/01/21 0300 12/01/21 0400 12/01/21 0500 12/01/21 0510  BP: 115/87 96/72 (!) 132/99   Pulse: 73 (!) 57 75   Resp: 15 17 18    Temp:   97.9 F (36.6 C)   TempSrc:   Oral   SpO2: 97% 98% 100%   Weight:    80.6 kg  Height:    5' 9"  (1.753 m)    Intake/Output Summary (Last 24 hours) at 12/01/2021 1040 Last data filed at 12/01/2021 0600 Gross per 24 hour  Intake 1033.19 ml  Output --  Net 1033.19 ml   Filed Weights   12/01/21 0510  Weight: 80.6 kg    Physical Examination:  General:  Average built, not in obvious distress HENT:   No scleral pallor or icterus noted. Oral mucosa is moist.  Chest:  Clear breath sounds.  Diminished breath sounds bilaterally. No crackles or wheezes.  CVS: S1 &S2 heard. No murmur.  Regular rate and rhythm. Abdomen: Soft, mild nonspecific tenderness noted, non distended.  Bowel sounds are heard.   Extremities: No cyanosis, clubbing or edema.  Peripheral pulses are palpable. Psych: Alert, awake and oriented, normal mood CNS:  No cranial nerve deficits.  Power equal in all extremities.   Skin: Warm and dry.  No rashes noted.  Data Reviewed:   CBC: Recent Labs  Lab 12/01/21 0217 12/01/21 0815  WBC 7.2  --   NEUTROABS 4.4  --   HGB 11.0* 10.2*  HCT 34.4* 31.7*  MCV 93.2  --  PLT 244  --     Basic Metabolic Panel: Recent Labs  Lab 12/01/21 0217  NA 138  K 3.8  CL 109  CO2 26  GLUCOSE 113*  BUN 21*  CREATININE 0.83  CALCIUM 8.4*    Liver Function Tests: Recent Labs  Lab 12/01/21 0217  AST 16  ALT 13  ALKPHOS 60  BILITOT 0.3  PROT 5.9*  ALBUMIN 3.2*     Radiology Studies: No results found.    LOS: 0 days    Flora Lipps, MD [facility] 12/01/2021, 10:40 AM

## 2021-12-01 NOTE — Discharge Summary (Signed)
Physician Discharge Summary   Patient: [name] MRN: [id number] DOB: 26-Nov-1970  Admit date:     12/01/2021  Discharge date: 12/02/21  Discharge Physician: Corrie Mckusick Lira Stephen   PCP: Andria Frames, PA-C   Recommendations at discharge:   Follow up with your primary care physician in one week.  CBC at that time.  Discharge Diagnoses: Principal Problem:   Acute lower GI bleeding Active Problems:   Essential hypertension  Resolved Problems:   * No resolved hospital problems. *   Hospital Course: Sydney [name] is a pleasant 51 y.o. female with medical history significant for GI stromal tumor status-post partial gastrectomy in 2005, hypertension, and diverticulosis, presented to the emergency department with painless hematochezia and near syncope.  Patient reported urgency to defecate followed by passage of dark red blood on 11/29/2021 followed by next episode with near syncopal event.  No abdominal pain.  History of GI bleed in 2019 and 2021 and history of gastritis and H. pylori.  Patient did have upper endoscopy and colonoscopy in the past.  In the ED patient was hemodynamically stable.  Hemoglobin of 11.0.  Type and screen was performed and patient was given 1 L of IV fluids and was admitted hospital for further evaluation and treatment.    Assessment and Plan: * Acute lower GI bleeding- (present on admission) Possibly diverticular.  Painless hematochezia.  Hemoglobin is 10.1 today from 10.2.  Was 15.2 in August 2022.  Likely hemodilution as well.  Hemodynamically remained stable.  Patient stated that she had some family emergency and had to leave today.  However she has not had further bright red blood per rectum and hemoglobin has pretty much remained stable.  I have advised her to seek medical attention for ongoing bleeding.  Essential hypertension- (present on admission) Patient did have a near syncopal episode on presentation.  Received IV fluids.  Asymptomatic at this  time.   Consultants: GI Procedures performed: None   Disposition: Home Diet recommendation:  Discharge Diet Orders (From admission, onward)     Start     Ordered   12/01/21 0000  Diet - low sodium heart healthy        12/01/21 1830   12/01/21 0000  Diet full liquid        12/01/21 1830           Full liquid diet  DISCHARGE MEDICATION: Allergies as of 12/01/2021   No Known Allergies      Medication List     STOP taking these medications    amoxicillin-clavulanate 875-125 MG tablet Commonly known as: AUGMENTIN       TAKE these medications    docusate sodium 100 MG capsule Commonly known as: Colace Take 1 capsule (100 mg total) by mouth daily.   losartan-hydrochlorothiazide 100-12.5 MG tablet Commonly known as: HYZAAR Take 1 tablet by mouth daily.   oxyCODONE-acetaminophen 5-325 MG tablet Commonly known as: PERCOCET/ROXICET Take 1 tablet by mouth every 6 (six) hours as needed for pain.        Follow-up Information     Park Meo T, PA-C Follow up in 1 week(s).   Specialty: Physician Assistant Contact information: [place] [address] [phone]                  Subjective: Today, patient was seen and examined at bedside.  Patient denies any abdominal pain, nausea vomiting.  Has not had a further rectal bleed since 1 AM this morning.  Has mild chills.  No nausea vomiting fever   Discharge Exam: Filed Weights   12/01/21 0510  Weight: 80.6 kg   General:  Average built, not in obvious distress HENT:   No scleral pallor or icterus noted. Oral mucosa is moist.  Chest:  Clear breath sounds.  Diminished breath sounds bilaterally. No crackles or wheezes.  CVS: S1 &S2 heard. No murmur.  Regular rate and rhythm. Abdomen: Soft, mild nonspecific tenderness noted, non distended.  Bowel sounds are heard.   Extremities: No cyanosis, clubbing or edema.  Peripheral pulses are palpable. Psych: Alert, awake and oriented,  normal mood CNS:  No cranial nerve deficits.  Power equal in all extremities.   Skin: Warm and dry.  No rashes noted.  Condition at discharge: good  The results of significant diagnostics from this hospitalization (including imaging, microbiology, ancillary and laboratory) are listed below for reference.   Imaging Studies: No results found.  Microbiology: Results for orders placed or performed during the hospital encounter of 12/01/21  Resp Panel by RT-PCR (Flu A&B, Covid) Nasopharyngeal Swab     Status: None   Collection Time: 12/01/21  3:12 AM   Specimen: Nasopharyngeal Swab; Nasopharyngeal(NP) swabs in vial transport medium  Result Value Ref Range Status   SARS Coronavirus 2 by RT PCR NEGATIVE NEGATIVE Final    Comment: (NOTE) SARS-CoV-2 target nucleic acids are NOT DETECTED.  The SARS-CoV-2 RNA is generally detectable in upper respiratory specimens during the acute phase of infection. The lowest concentration of SARS-CoV-2 viral copies this assay can detect is 138 copies/mL. A negative result does not preclude SARS-Cov-2 infection and should not be used as the sole basis for treatment or other patient management decisions. A negative result may occur with  improper specimen collection/handling, submission of specimen other than nasopharyngeal swab, presence of viral mutation(s) within the areas targeted by this assay, and inadequate number of viral copies(<138 copies/mL). A negative result must be combined with clinical observations, patient history, and epidemiological information. The expected result is Negative.  Fact Sheet for Patients:  EntrepreneurPulse.com.au  Fact Sheet for Healthcare Providers:  IncredibleEmployment.be  This test is no t yet approved or cleared by the [place] FDA and  has been authorized for detection and/or diagnosis of SARS-CoV-2 by FDA under an Emergency Use Authorization (EUA). This EUA will remain   in effect (meaning this test can be used) for the duration of the COVID-19 declaration under Section 564(b)(1) of the Act, 21 U.S.C.section 360bbb-3(b)(1), unless the authorization is terminated  or revoked sooner.       Influenza A by PCR NEGATIVE NEGATIVE Final   Influenza B by PCR NEGATIVE NEGATIVE Final    Comment: (NOTE) The Xpert Xpress SARS-CoV-2/FLU/RSV plus assay is intended as an aid in the diagnosis of influenza from Nasopharyngeal swab specimens and should not be used as a sole basis for treatment. Nasal washings and aspirates are unacceptable for Xpert Xpress SARS-CoV-2/FLU/RSV testing.  Fact Sheet for Patients: EntrepreneurPulse.com.au  Fact Sheet for Healthcare Providers: IncredibleEmployment.be  This test is not yet approved or cleared by the [place] FDA and has been authorized for detection and/or diagnosis of SARS-CoV-2 by FDA under an Emergency Use Authorization (EUA). This EUA will remain in effect (meaning this test can be used) for the duration of the COVID-19 declaration under Section 564(b)(1) of the Act, 21 U.S.C. section 360bbb-3(b)(1), unless the authorization is terminated or revoked.  Performed at [facility], [place] [address] [place], [place] [address]  Labs: CBC: Recent Labs  Lab 12/01/21 0217 12/01/21 0815 12/01/21 1645  WBC 7.2  --   --   NEUTROABS 4.4  --   --   HGB 11.0* 10.2* 10.1*  HCT 34.4* 31.7*  --   MCV 93.2  --   --   PLT 244  --   --    Basic Metabolic Panel: Recent Labs  Lab 12/01/21 0217  NA 138  K 3.8  CL 109  CO2 26  GLUCOSE 113*  BUN 21*  CREATININE 0.83  CALCIUM 8.4*   Liver Function Tests: Recent Labs  Lab 12/01/21 0217  AST 16  ALT 13  ALKPHOS 60  BILITOT 0.3  PROT 5.9*  ALBUMIN 3.2*   CBG: No results for input(s): GLUCAP in the last 168 hours.  Discharge time spent: greater than 30 minutes.  Signed: Flora Lipps,  MD [facility] 12/02/2021

## 2021-12-01 NOTE — Progress Notes (Signed)
Provided and discharge instructions. Addressed all questions and concerns. IV removed intact. Sydney [name]

## 2022-01-24 ENCOUNTER — Other Ambulatory Visit: Payer: Self-pay

## 2022-01-24 ENCOUNTER — Encounter (HOSPITAL_COMMUNITY): Payer: Self-pay | Admitting: Emergency Medicine

## 2022-01-24 ENCOUNTER — Ambulatory Visit (HOSPITAL_COMMUNITY)
Admission: EM | Admit: 2022-01-24 | Discharge: 2022-01-24 | Disposition: A | Discharge status: Home / Self Care | Payer: 59 | Attending: Nurse Practitioner | Admitting: Nurse Practitioner

## 2022-01-24 DIAGNOSIS — R238 Other skin changes: Secondary | ICD-10-CM | POA: Diagnosis not present

## 2022-01-24 MED ORDER — METHYLPREDNISOLONE SODIUM SUCC 125 MG IJ SOLR
80.0000 mg | Freq: Once | INTRAMUSCULAR | Status: AC
  Administered 2022-01-24: 80 mg via INTRAMUSCULAR

## 2022-01-24 MED ORDER — METHYLPREDNISOLONE SODIUM SUCC 125 MG IJ SOLR
INTRAMUSCULAR | Status: AC
  Filled 2022-01-24: qty 2

## 2022-01-24 MED ORDER — KETOCONAZOLE 2 % EX SHAM: 1.0000 "application " | MEDICATED_SHAMPOO | CUTANEOUS | 0 refills | Status: AC
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
## 2022-01-24 NOTE — ED Triage Notes (Signed)
Noticed scalp itching, tingling, burning ion scalp.  Patient noticed this last night ?

## 2022-01-24 NOTE — Discharge Instructions (Addendum)
-   We have given you a Solu-Medrol injection today to help with possible allergic reaction that is causing the scalp irritation ?-Please start cetirizine 10 mg daily to help with the itch.  You can also use Benadryl 25 to 50 mg at nighttime to help with itching. ?-Please also start Pepcid 20 mg twice daily. ?-I have sent the Nizoral shampoo to your pharmacy that you can try-this is typically for dandruff and as I explained, I do not think that you have this. ? ?

## 2022-05-03 ENCOUNTER — Ambulatory Visit (HOSPITAL_COMMUNITY)
Admission: EM | Admit: 2022-05-03 | Discharge: 2022-05-03 | Disposition: A | Discharge status: Home / Self Care | Payer: 59 | Attending: Family Medicine | Admitting: Family Medicine

## 2022-05-03 ENCOUNTER — Other Ambulatory Visit: Payer: Self-pay

## 2022-05-03 ENCOUNTER — Encounter (HOSPITAL_COMMUNITY): Payer: Self-pay | Admitting: Emergency Medicine

## 2022-05-03 DIAGNOSIS — N3090 Cystitis, unspecified without hematuria: Secondary | ICD-10-CM | POA: Insufficient documentation

## 2022-05-03 LAB — POCT URINALYSIS DIPSTICK, ED / UC
Bilirubin Urine: NEGATIVE
Glucose, UA: NEGATIVE mg/dL
Ketones, ur: NEGATIVE mg/dL
Nitrite: NEGATIVE
Protein, ur: 30 mg/dL — AB
Specific Gravity, Urine: 1.02 (ref 1.005–1.030)
Urobilinogen, UA: 0.2 mg/dL (ref 0.0–1.0)
pH: 6 (ref 5.0–8.0)

## 2022-05-03 MED ORDER — PHENAZOPYRIDINE HCL 100 MG PO TABS: 100.0000 mg | ORAL_TABLET | Freq: Three times a day (TID) | ORAL | 0 refills | Status: DC | PRN

## 2022-05-03 MED ORDER — CIPROFLOXACIN HCL 500 MG PO TABS: 500.0000 mg | ORAL_TABLET | Freq: Two times a day (BID) | ORAL | 0 refills | Status: AC
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
## 2022-05-03 NOTE — Discharge Instructions (Addendum)
The urinalysis showed some white blood cells and a trace of blood.  This is consistent with cystitis.  Take Cipro 500 mg--1 tablet 2 times daily for 7 days.  This is your antibiotic  Urine culture is sent, and if it looks like the antibiotic needs to be changed, staff will call you.  Pyridium 100 mg--take 1-2 tablets every 8 hours as needed for bladder or urinary pain

## 2022-05-03 NOTE — ED Triage Notes (Signed)
Complains of symptoms for one week.  Urine is reported as cloudy, pain at end of urinary stream.  Lower abdominal pain.  Denies fever or chills.

## 2022-05-05 LAB — URINE CULTURE: Culture: 50000 — AB

## 2023-09-18 ENCOUNTER — Encounter (HOSPITAL_COMMUNITY): Payer: Self-pay | Admitting: Emergency Medicine

## 2023-09-18 ENCOUNTER — Other Ambulatory Visit: Payer: Self-pay

## 2023-09-18 ENCOUNTER — Ambulatory Visit (HOSPITAL_COMMUNITY)
Admission: EM | Admit: 2023-09-18 | Discharge: 2023-09-18 | Disposition: A | Discharge status: Home / Self Care | Payer: Medicaid Other | Attending: Emergency Medicine | Admitting: Emergency Medicine

## 2023-09-18 DIAGNOSIS — R252 Cramp and spasm: Secondary | ICD-10-CM

## 2023-09-18 DIAGNOSIS — L0291 Cutaneous abscess, unspecified: Secondary | ICD-10-CM

## 2023-09-18 MED ORDER — GABAPENTIN 300 MG PO CAPS: 300.0000 mg | ORAL_CAPSULE | Freq: Every day | ORAL | 0 refills | Status: DC

## 2023-09-18 MED ORDER — DOXYCYCLINE HYCLATE 100 MG PO CAPS: 100.0000 mg | ORAL_CAPSULE | Freq: Two times a day (BID) | ORAL | 0 refills | Status: DC
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
## 2023-09-18 NOTE — Discharge Instructions (Addendum)
Leg cramping most likely related to lack of hydration.  She should drink at least 90 ounces of water a day. Doxycycline antibiotic take until completed 1 capsule twice a day.  Use warm compresses to the abscess area. Magnesium 400 mg at night may help with leg cramping.   Gabapentin 1 tablet at bedtime for leg pains. Ensure you are having enough electrolytes during intake of meals. Consider wearing support hose.  Follow-up with PCP for continued management of spider veins on legs.

## 2023-09-18 NOTE — ED Triage Notes (Signed)
Bilateral leg pain for the past few days, abscess on right groin and nose bleed.

## 2023-11-19 ENCOUNTER — Ambulatory Visit (HOSPITAL_COMMUNITY)
Admission: EM | Admit: 2023-11-19 | Discharge: 2023-11-19 | Disposition: A | Discharge status: Home / Self Care | Payer: Medicaid Other | Attending: Emergency Medicine | Admitting: Emergency Medicine

## 2023-11-19 ENCOUNTER — Encounter (HOSPITAL_COMMUNITY): Payer: Self-pay

## 2023-11-19 DIAGNOSIS — L2084 Intrinsic (allergic) eczema: Secondary | ICD-10-CM

## 2023-11-19 DIAGNOSIS — I10 Essential (primary) hypertension: Secondary | ICD-10-CM

## 2023-11-19 MED ORDER — LOSARTAN POTASSIUM-HCTZ 100-12.5 MG PO TABS
1.0000 | ORAL_TABLET | Freq: Every day | ORAL | 1 refills | Status: DC
Start: 2023-11-19 — End: 2024-06-24

## 2023-11-19 MED ORDER — TRIAMCINOLONE ACETONIDE 0.1 % EX CREA: 1.0000 | TOPICAL_CREAM | Freq: Two times a day (BID) | CUTANEOUS | 0 refills | Status: DC

## 2023-11-19 MED ORDER — AMLODIPINE BESYLATE 10 MG PO TABS: 10.0000 mg | ORAL_TABLET | Freq: Every day | ORAL | 1 refills | Status: DC
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
## 2023-11-19 NOTE — Discharge Instructions (Signed)
Apply triamcinolone to your hands twice daily.  Otherwise make sure you are frequently moisturizing your hands.  Return here if symptoms persist.  I have refilled your amlodipine and losartan-HCTZ and given you 1 additional refill to help with management of your blood pressure.  Please follow-up with your primary care provider regarding further management of your blood pressure.

## 2023-11-19 NOTE — ED Triage Notes (Signed)
Patient c/o intermittent rash and itching on both hands x 6 months worse in the past few days.  Patient states she has been using vaseline.

## 2023-11-21 ENCOUNTER — Encounter (HOSPITAL_COMMUNITY): Payer: Self-pay

## 2023-11-21 ENCOUNTER — Ambulatory Visit (HOSPITAL_COMMUNITY)
Admission: EM | Admit: 2023-11-21 | Discharge: 2023-11-21 | Disposition: A | Discharge status: Home / Self Care | Payer: Self-pay | Attending: Internal Medicine | Admitting: Internal Medicine

## 2023-11-21 DIAGNOSIS — L299 Pruritus, unspecified: Secondary | ICD-10-CM

## 2023-11-21 DIAGNOSIS — Z9109 Other allergy status, other than to drugs and biological substances: Secondary | ICD-10-CM

## 2023-11-21 DIAGNOSIS — R21 Rash and other nonspecific skin eruption: Secondary | ICD-10-CM

## 2023-11-21 MED ORDER — METHYLPREDNISOLONE ACETATE 40 MG/ML IJ SUSP
40.0000 mg | Freq: Once | INTRAMUSCULAR | Status: AC
  Administered 2023-11-21: 40 mg via INTRAMUSCULAR

## 2023-11-21 MED ORDER — METHYLPREDNISOLONE ACETATE 40 MG/ML IJ SUSP
INTRAMUSCULAR | Status: AC
  Filled 2023-11-21: qty 1

## 2023-11-21 MED ORDER — PERMETHRIN 5 % EX CREA: TOPICAL_CREAM | CUTANEOUS | 1 refills | Status: DC
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
## 2023-11-21 NOTE — Discharge Instructions (Addendum)
Diffuse rash and pruritus with concern for mite exposure.  This is most affected on the hands.  Will treat with the following: Medrol injection given today.  This is a steroid to help relieve the itching Permethrin 5% cream apply topically to all affected areas at night and leave on until the next morning then wash off.  May repeat if needed.  This is a cream to treat for mites. Pick up prescription for triamcinolone cream and apply twice daily to the areas that are itching.  This cream is a steroid that treats rash and itching Pick up blood pressure medication including Norvasc and losartan/hydrochlorothiazide as your blood pressure is very elevated.  You should follow-up with your primary care physician in regards to this for further management.  If you develop chest pain, severe headache, strokelike symptoms then you should go to the emergency room immediately Call and schedule an appointment with a dermatologist for further evaluation as soon as possible Return to urgent care or PCP if symptoms worsen or fail to resolve.

## 2023-11-21 NOTE — ED Triage Notes (Signed)
Patient here today with c/o rash on both hands X 6 months that has been worsening over the past few days. Patient was here 2 days ago and thinks she was misdiagnosed. Symptoms have worsened. Patient believes that it is coming from bug bites. She notices it more when touches her fabric in her home. Patient states that she has not tried the cream yet because she does not think she has eczema.

## 2023-11-27 ENCOUNTER — Emergency Department (HOSPITAL_COMMUNITY)
Admission: EM | Admit: 2023-11-27 | Discharge: 2023-11-27 | Disposition: A | Discharge status: Home / Self Care | Payer: Self-pay | Attending: Emergency Medicine | Admitting: Emergency Medicine

## 2023-11-27 ENCOUNTER — Encounter (HOSPITAL_COMMUNITY): Payer: Self-pay | Admitting: *Deleted

## 2023-11-27 ENCOUNTER — Other Ambulatory Visit: Payer: Self-pay

## 2023-11-27 DIAGNOSIS — R202 Paresthesia of skin: Secondary | ICD-10-CM | POA: Insufficient documentation

## 2023-11-27 NOTE — ED Provider Notes (Signed)
 [facility] Emergency Department Provider Note MRN:  [id number]  Arrival date & time: 11/27/23     Chief Complaint   seeing bugs   History of Present Illness   Sydney [name] is a 53 y.o. year-old female presents to the ED with chief complaint of seeing insects crawling on her skin.  She states that they are microscopic, but sometimes they crawl out and you can see them, but then the crawl back inside her skin.  She states that she noticed some at her house.  She states that they are crawling all of her body.  She has not taken anything for symptoms.  She states that she tried to talk with her landlord about pest control, but states that they aren't doing anything.  She denies any other symptoms.  History provided by patient.   Review of Systems  Pertinent positive and negative review of systems noted in HPI.    Physical Exam   Vitals:   11/27/23 0220  BP: (!) 164/124  Pulse: 94  Resp: 18  Temp: 98.6 F (37 C)  SpO2: 100%    CONSTITUTIONAL:  non toxic-appearing, NAD NEURO:  Alert and oriented x 3, CN 3-12 grossly intact EYES:  eyes equal and reactive ENT/NECK:  Supple, no stridor  CARDIO:  normal rate, regular rhythm, appears well-perfused  PULM:  No respiratory distress,  GI/GU:  non-distended,  MSK/SPINE:  No gross deformities, no edema, moves all extremities  SKIN:  no rash, atraumatic, no bugs seen   *Additional and/or pertinent findings included in MDM below  Diagnostic and Interventional Summary    EKG Interpretation Date/Time:    Ventricular Rate:    PR Interval:    QRS Duration:    QT Interval:    QTC Calculation:   R Axis:      Text Interpretation:         Labs Reviewed - No data to display  No orders to display    Medications - No data to display   Procedures  /  Critical Care Procedures  ED Course and Medical Decision Making  I have reviewed the triage vital signs, the nursing notes, and pertinent available  records from the EMR.  Social Determinants Affecting Complexity of Care: Patient has no clinically significant social determinants affecting this chief complaint..   ED Course:    Medical Decision Making Patient is concerned about bugs crawling on her skin.  She states that they are microscopic and go into her skin and then come out and then go back in.  She states that sometimes she can see them, and sometimes she cannot.  She thinks that she found them at her apartment.  She states that they are all over her skin.  I do not see anything on my exam.  She does not appear toxic.  No prior history of delusional parasitosis noted.  I am suspicious of this.  I will refer her back to primary care.  Will trial some Atarax to see if that will give her any relief.         Consultants: No consultations were needed in caring for this patient.   Treatment and Plan: Emergency department workup does not suggest an emergent condition requiring admission or immediate intervention beyond  what has been performed at this time. The patient is safe for discharge and has  been instructed to return immediately for worsening symptoms, change in  symptoms or any other concerns    Final Clinical  Impressions(s) / ED Diagnoses     ICD-10-CM   1. Formication  R20.2       ED Discharge Orders     None         Discharge Instructions Discussed with and Provided to Patient:     Discharge Instructions      Please follow up with your doctor.         Roxy Horseman, PA-C 11/27/23 1610    Glynn Octave, MD 11/27/23 [phone]

## 2023-11-27 NOTE — ED Triage Notes (Incomplete)
The pt has multiple complaints headache dizziness nausea

## 2023-11-27 NOTE — ED Triage Notes (Signed)
 The pt reports that she has had bugs in her house for months and she reports that she feels bugs crawling and biting her all over her body

## 2023-11-27 NOTE — Discharge Instructions (Signed)
Please follow up with your doctor.

## 2023-12-10 ENCOUNTER — Encounter (HOSPITAL_COMMUNITY): Payer: Self-pay

## 2023-12-10 ENCOUNTER — Ambulatory Visit (HOSPITAL_COMMUNITY)
Admission: EM | Admit: 2023-12-10 | Discharge: 2023-12-10 | Disposition: A | Discharge status: Home / Self Care | Attending: Family Medicine | Admitting: Family Medicine

## 2023-12-10 DIAGNOSIS — L299 Pruritus, unspecified: Secondary | ICD-10-CM

## 2023-12-10 DIAGNOSIS — B86 Scabies: Secondary | ICD-10-CM | POA: Diagnosis not present

## 2023-12-10 NOTE — Discharge Instructions (Addendum)
 You were seen today for follow up .  You are free to return to work on Monday.  Please return as needed.
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
## 2023-12-10 NOTE — ED Triage Notes (Signed)
 Pt states was recently tx'd for scabies and needs a work note to return. Denies sx's.

## 2024-04-30 ENCOUNTER — Emergency Department (HOSPITAL_COMMUNITY)
Admission: EM | Admit: 2024-04-30 | Discharge: 2024-05-01 | Disposition: A | Discharge status: Home / Self Care | Attending: Emergency Medicine | Admitting: Emergency Medicine

## 2024-04-30 ENCOUNTER — Encounter (HOSPITAL_COMMUNITY): Payer: Self-pay | Admitting: *Deleted

## 2024-04-30 ENCOUNTER — Other Ambulatory Visit: Payer: Self-pay

## 2024-04-30 DIAGNOSIS — K625 Hemorrhage of anus and rectum: Secondary | ICD-10-CM | POA: Diagnosis present

## 2024-04-30 DIAGNOSIS — I10 Essential (primary) hypertension: Secondary | ICD-10-CM | POA: Insufficient documentation

## 2024-04-30 DIAGNOSIS — Z79899 Other long term (current) drug therapy: Secondary | ICD-10-CM | POA: Diagnosis not present

## 2024-04-30 DIAGNOSIS — F17200 Nicotine dependence, unspecified, uncomplicated: Secondary | ICD-10-CM | POA: Diagnosis not present

## 2024-04-30 LAB — URINALYSIS, ROUTINE W REFLEX MICROSCOPIC
Bacteria, UA: NONE SEEN
Bilirubin Urine: NEGATIVE
Glucose, UA: NEGATIVE mg/dL
Ketones, ur: NEGATIVE mg/dL
Leukocytes,Ua: NEGATIVE
Nitrite: NEGATIVE
Protein, ur: NEGATIVE mg/dL
Specific Gravity, Urine: 1.023 (ref 1.005–1.030)
pH: 5 (ref 5.0–8.0)

## 2024-04-30 LAB — SAMPLE TO BLOOD BANK

## 2024-04-30 NOTE — ED Triage Notes (Signed)
 The pt has abd pain and dark bloody stools since this am  hx of gi bleeding   no n or v

## 2024-05-01 LAB — CBC
HCT: 36.7 % (ref 36.0–46.0)
Hemoglobin: 11.6 g/dL — ABNORMAL LOW (ref 12.0–15.0)
MCH: 29.4 pg (ref 26.0–34.0)
MCHC: 31.6 g/dL (ref 30.0–36.0)
MCV: 92.9 fL (ref 80.0–100.0)
Platelets: 241 K/uL (ref 150–400)
RBC: 3.95 MIL/uL (ref 3.87–5.11)
RDW: 13.9 % (ref 11.5–15.5)
WBC: 6.6 K/uL (ref 4.0–10.5)
nRBC: 0 % (ref 0.0–0.2)

## 2024-05-01 LAB — COMPREHENSIVE METABOLIC PANEL WITH GFR
ALT: 13 U/L (ref 0–44)
AST: 20 U/L (ref 15–41)
Albumin: 3.3 g/dL — ABNORMAL LOW (ref 3.5–5.0)
Alkaline Phosphatase: 61 U/L (ref 38–126)
Anion gap: 8 (ref 5–15)
BUN: 17 mg/dL (ref 6–20)
CO2: 24 mmol/L (ref 22–32)
Calcium: 8.4 mg/dL — ABNORMAL LOW (ref 8.9–10.3)
Chloride: 107 mmol/L (ref 98–111)
Creatinine, Ser: 0.88 mg/dL (ref 0.44–1.00)
GFR, Estimated: >60 mL/min
Glucose, Bld: 114 mg/dL — ABNORMAL HIGH (ref 70–99)
Potassium: 3.6 mmol/L (ref 3.5–5.1)
Sodium: 139 mmol/L (ref 135–145)
Total Bilirubin: 0.5 mg/dL (ref 0.0–1.2)
Total Protein: 6 g/dL — ABNORMAL LOW (ref 6.5–8.1)

## 2024-05-01 LAB — LIPASE, BLOOD: Lipase: 38 U/L (ref 11–51)

## 2024-05-01 MED ORDER — ACETAMINOPHEN 325 MG PO TABS
650.0000 mg | ORAL_TABLET | Freq: Once | ORAL | Status: AC
  Administered 2024-05-01: 650 mg via ORAL
  Filled 2024-05-01: qty 2

## 2024-05-01 NOTE — Discharge Instructions (Signed)
 Please follow-up with gastroenterology for further evaluation of your rectal bleeding.  If you come lightheaded, short of breath, have chest pain, begin to bleed large amounts, or develop any other life-threatening symptoms please return to the emergency department.

## 2024-05-01 NOTE — ED Provider Notes (Signed)
 [place] EMERGENCY DEPARTMENT AT [facility] Provider Note   CSN: [id number] Arrival date & time: 04/30/24  2309     Patient presents with: Rectal Bleeding   Sydney [name] is a 53 y.o. female.  Patient with history of lower GI bleeding, hypertension presents to the emergency department complaining of dark bloody stools that began this a.m.  She denies any associated abdominal pain, nausea, vomiting.  She states that she had a headache earlier this week that she was treating with Goody powders.  She took approximately 3 Goody powders over the past 2 to 3 days.  She also had some constipation which she treated with a laxative.  After using a laxative she noticed the blood in her stool.  She has a history of similar complaints and has had previous endoscopy and colonoscopy with no reported acute findings to explain patient's symptoms.  She does not currently follow with an outpatient gastroenterologist.  The patient denies syncope, chest pain, shortness of breath, weakness, fatigue.    Rectal Bleeding      Prior to Admission medications   Medication Sig Start Date End Date Taking? Authorizing Provider  amLODipine  (NORVASC ) 10 MG tablet Take 1 tablet (10 mg total) by mouth daily. 11/19/23   Johnie Flaming A, NP  losartan -hydrochlorothiazide  (HYZAAR) 100-12.5 MG tablet Take 1 tablet by mouth daily. 11/19/23   Johnie Flaming LABOR, NP    Allergies: Patient has no known allergies.    Review of Systems  Gastrointestinal:  Positive for hematochezia.    Updated Vital Signs BP (!) 161/118 (BP Location: Right Arm)   Pulse 79   Temp 98.3 F (36.8 C)   Resp 18   Ht 5' 9 (1.753 m)   Wt 93 kg   LMP 04/26/2016   SpO2 100%   BMI 30.28 kg/m   Physical Exam Vitals and nursing note reviewed.  Constitutional:      General: She is not in acute distress.    Appearance: She is well-developed.  HENT:     Head: Normocephalic and atraumatic.  Eyes:     Conjunctiva/sclera:  Conjunctivae normal.  Cardiovascular:     Rate and Rhythm: Normal rate and regular rhythm.     Heart sounds: No murmur heard. Pulmonary:     Effort: Pulmonary effort is normal. No respiratory distress.     Breath sounds: Normal breath sounds.  Abdominal:     Palpations: Abdomen is soft.     Tenderness: There is no abdominal tenderness.  Musculoskeletal:        General: No swelling.     Cervical back: Neck supple.  Skin:    General: Skin is warm and dry.     Capillary Refill: Capillary refill takes less than 2 seconds.  Neurological:     Mental Status: She is alert.  Psychiatric:        Mood and Affect: Mood normal.     (all labs ordered are listed, but only abnormal results are displayed) Labs Reviewed  COMPREHENSIVE METABOLIC PANEL WITH GFR - Abnormal; Notable for the following components:      Result Value   Glucose, Bld 114 (*)    Calcium 8.4 (*)    Total Protein 6.0 (*)    Albumin 3.3 (*)    All other components within normal limits  CBC - Abnormal; Notable for the following components:   Hemoglobin 11.6 (*)    All other components within normal limits  URINALYSIS, ROUTINE W REFLEX MICROSCOPIC - Abnormal;  Notable for the following components:   Hgb urine dipstick MODERATE (*)    All other components within normal limits  LIPASE, BLOOD  SAMPLE TO BLOOD BANK    EKG: None  Radiology: No results found.   Procedures   Medications Ordered in the ED - No data to display                                  Medical Decision Making  This patient presents to the ED for concern of rectal bleeding, this involves an extensive number of treatment options, and is a complaint that carries with it a high risk of complications and morbidity.  The differential diagnosis includes lower GI bleed, hemorrhoids, others   Co morbidities / Chronic conditions that complicate the patient evaluation  History of lower GI bleed   Additional history obtained:  Additional history  obtained from EMR External records from outside source obtained and reviewed including notes showing multiple admissions for similar complaints, no outpatient GI follow-up   Lab Tests:  I Ordered, and personally interpreted labs.  The pertinent results include: Hemoglobin 11.6 at baseline   Imaging Studies ordered:  Patient with no abdominal tenderness on exam.  No signs of surgical/acute abdomen.  No indication for emergent abdominal imaging   Social Determinants of Health:  Patient is a daily smoker   Test / Admission - Considered:  Patient with evidence of a lower GI bleed and history of the same.  Her hemoglobin is stable at this time.  I discussed admission for GI consultation but the patient states she would rather follow-up as an outpatient which seems reasonable based on hemoglobin and reported 3-4 total episodes of blood noted.  Patient has been provided with strict return precautions including lightheadedness, chest pain, shortness of breath, large amounts of bleeding.  I will provide her with contact information for gastroenterology for outpatient follow-up.      Final diagnoses:  Rectal bleeding    ED Discharge Orders     None          Logan Ubaldo KATHEE DEVONNA 05/01/24 0041    Jerrol Agent, MD 05/01/24 [id number]

## 2024-06-19 ENCOUNTER — Other Ambulatory Visit: Payer: Self-pay

## 2024-06-19 ENCOUNTER — Emergency Department (HOSPITAL_COMMUNITY)

## 2024-06-19 ENCOUNTER — Inpatient Hospital Stay (HOSPITAL_COMMUNITY)

## 2024-06-19 ENCOUNTER — Inpatient Hospital Stay (HOSPITAL_COMMUNITY)
Admission: EM | Disposition: A | Discharge status: Home / Self Care | Payer: Self-pay | Source: Home / Self Care | Attending: Surgery

## 2024-06-19 ENCOUNTER — Emergency Department (HOSPITAL_COMMUNITY): Admitting: Certified Registered Nurse Anesthetist

## 2024-06-19 ENCOUNTER — Encounter (HOSPITAL_COMMUNITY): Payer: Self-pay

## 2024-06-19 ENCOUNTER — Inpatient Hospital Stay (HOSPITAL_COMMUNITY)
Admission: EM | Admit: 2024-06-19 | Discharge: 2024-06-24 | DRG: 220 | Disposition: A | Discharge status: Home / Self Care | Attending: Surgery | Admitting: Surgery

## 2024-06-19 DIAGNOSIS — Z8249 Family history of ischemic heart disease and other diseases of the circulatory system: Secondary | ICD-10-CM

## 2024-06-19 DIAGNOSIS — Z95828 Presence of other vascular implants and grafts: Secondary | ICD-10-CM

## 2024-06-19 DIAGNOSIS — Z90710 Acquired absence of both cervix and uterus: Secondary | ICD-10-CM

## 2024-06-19 DIAGNOSIS — I10 Essential (primary) hypertension: Secondary | ICD-10-CM | POA: Diagnosis present

## 2024-06-19 DIAGNOSIS — Z85831 Personal history of malignant neoplasm of soft tissue: Secondary | ICD-10-CM

## 2024-06-19 DIAGNOSIS — Z87440 Personal history of urinary (tract) infections: Secondary | ICD-10-CM | POA: Diagnosis not present

## 2024-06-19 DIAGNOSIS — Z1152 Encounter for screening for COVID-19: Secondary | ICD-10-CM

## 2024-06-19 DIAGNOSIS — I7121 Aneurysm of the ascending aorta, without rupture: Secondary | ICD-10-CM | POA: Diagnosis present

## 2024-06-19 DIAGNOSIS — I7100 Dissection of unspecified site of aorta: Secondary | ICD-10-CM | POA: Diagnosis present

## 2024-06-19 DIAGNOSIS — D689 Coagulation defect, unspecified: Secondary | ICD-10-CM | POA: Diagnosis present

## 2024-06-19 DIAGNOSIS — Z818 Family history of other mental and behavioral disorders: Secondary | ICD-10-CM | POA: Diagnosis not present

## 2024-06-19 DIAGNOSIS — Z833 Family history of diabetes mellitus: Secondary | ICD-10-CM

## 2024-06-19 DIAGNOSIS — I3139 Other pericardial effusion (noninflammatory): Secondary | ICD-10-CM | POA: Diagnosis present

## 2024-06-19 DIAGNOSIS — F17210 Nicotine dependence, cigarettes, uncomplicated: Secondary | ICD-10-CM | POA: Diagnosis present

## 2024-06-19 DIAGNOSIS — I7101 Dissection of ascending aorta: Principal | ICD-10-CM | POA: Diagnosis present

## 2024-06-19 DIAGNOSIS — Z9221 Personal history of antineoplastic chemotherapy: Secondary | ICD-10-CM

## 2024-06-19 DIAGNOSIS — H5462 Unqualified visual loss, left eye, normal vision right eye: Secondary | ICD-10-CM | POA: Diagnosis present

## 2024-06-19 DIAGNOSIS — Z8679 Personal history of other diseases of the circulatory system: Secondary | ICD-10-CM

## 2024-06-19 DIAGNOSIS — Z823 Family history of stroke: Secondary | ICD-10-CM

## 2024-06-19 DIAGNOSIS — D62 Acute posthemorrhagic anemia: Secondary | ICD-10-CM | POA: Diagnosis not present

## 2024-06-19 HISTORY — PX: THORACIC AORTIC ANEURYSM REPAIR: SHX799

## 2024-06-19 LAB — POCT I-STAT 7, (LYTES, BLD GAS, ICA,H+H)
Acid-base deficit: 4 mmol/L — ABNORMAL HIGH (ref 0.0–2.0)
Acid-base deficit: 1 mmol/L (ref 0.0–2.0)
Acid-base deficit: 1 mmol/L (ref 0.0–2.0)
Acid-base deficit: 2 mmol/L (ref 0.0–2.0)
Acid-base deficit: 2 mmol/L (ref 0.0–2.0)
Acid-base deficit: 3 mmol/L — ABNORMAL HIGH (ref 0.0–2.0)
Acid-base deficit: 4 mmol/L — ABNORMAL HIGH (ref 0.0–2.0)
Bicarbonate: 21.2 mmol/L (ref 20.0–28.0)
Bicarbonate: 22.8 mmol/L (ref 20.0–28.0)
Bicarbonate: 26.3 mmol/L (ref 20.0–28.0)
Bicarbonate: 22.5 mmol/L (ref 20.0–28.0)
Bicarbonate: 23.1 mmol/L (ref 20.0–28.0)
Bicarbonate: 22.8 mmol/L (ref 20.0–28.0)
Bicarbonate: 21 mmol/L (ref 20.0–28.0)
Calcium, Ion: 1.2 mmol/L (ref 1.15–1.40)
Calcium, Ion: 0.96 mmol/L — ABNORMAL LOW (ref 1.15–1.40)
Calcium, Ion: 1.07 mmol/L — ABNORMAL LOW (ref 1.15–1.40)
Calcium, Ion: 1.02 mmol/L — ABNORMAL LOW (ref 1.15–1.40)
Calcium, Ion: 1.05 mmol/L — ABNORMAL LOW (ref 1.15–1.40)
Calcium, Ion: 1.16 mmol/L (ref 1.15–1.40)
Calcium, Ion: 1.09 mmol/L — ABNORMAL LOW (ref 1.15–1.40)
HCT: 33 % — ABNORMAL LOW (ref 36.0–46.0)
HCT: 22 % — ABNORMAL LOW (ref 36.0–46.0)
HCT: 24 % — ABNORMAL LOW (ref 36.0–46.0)
HCT: 20 % — ABNORMAL LOW (ref 36.0–46.0)
HCT: 28 % — ABNORMAL LOW (ref 36.0–46.0)
HCT: 26 % — ABNORMAL LOW (ref 36.0–46.0)
HCT: 24 % — ABNORMAL LOW (ref 36.0–46.0)
Hemoglobin: 11.2 g/dL — ABNORMAL LOW (ref 12.0–15.0)
Hemoglobin: 7.5 g/dL — ABNORMAL LOW (ref 12.0–15.0)
Hemoglobin: 8.2 g/dL — ABNORMAL LOW (ref 12.0–15.0)
Hemoglobin: 6.8 g/dL — CL (ref 12.0–15.0)
Hemoglobin: 9.5 g/dL — ABNORMAL LOW (ref 12.0–15.0)
Hemoglobin: 8.8 g/dL — ABNORMAL LOW (ref 12.0–15.0)
Hemoglobin: 8.2 g/dL — ABNORMAL LOW (ref 12.0–15.0)
O2 Saturation: 99 %
O2 Saturation: 100 %
O2 Saturation: 100 %
O2 Saturation: 100 %
O2 Saturation: 96 %
O2 Saturation: 97 %
O2 Saturation: 99 %
Patient temperature: 34.5
Patient temperature: 36.8
Patient temperature: 36.9
Potassium: 3.7 mmol/L (ref 3.5–5.1)
Potassium: 3.5 mmol/L (ref 3.5–5.1)
Potassium: 4.8 mmol/L (ref 3.5–5.1)
Potassium: 3.9 mmol/L (ref 3.5–5.1)
Potassium: 3.5 mmol/L (ref 3.5–5.1)
Potassium: 3.9 mmol/L (ref 3.5–5.1)
Potassium: 3.8 mmol/L (ref 3.5–5.1)
Sodium: 139 mmol/L (ref 135–145)
Sodium: 138 mmol/L (ref 135–145)
Sodium: 136 mmol/L (ref 135–145)
Sodium: 137 mmol/L (ref 135–145)
Sodium: 139 mmol/L (ref 135–145)
Sodium: 138 mmol/L (ref 135–145)
Sodium: 140 mmol/L (ref 135–145)
TCO2: 22 mmol/L (ref 22–32)
TCO2: 24 mmol/L (ref 22–32)
TCO2: 28 mmol/L (ref 22–32)
TCO2: 24 mmol/L (ref 22–32)
TCO2: 24 mmol/L (ref 22–32)
TCO2: 24 mmol/L (ref 22–32)
TCO2: 22 mmol/L (ref 22–32)
pCO2 arterial: 38.1 mmHg (ref 32–48)
pCO2 arterial: 30.4 mmHg — ABNORMAL LOW (ref 32–48)
pCO2 arterial: 57.8 mmHg — ABNORMAL HIGH (ref 32–48)
pCO2 arterial: 35.2 mmHg (ref 32–48)
pCO2 arterial: 34 mmHg (ref 32–48)
pCO2 arterial: 40.7 mmHg (ref 32–48)
pCO2 arterial: 37.1 mmHg (ref 32–48)
pH, Arterial: 7.354 (ref 7.35–7.45)
pH, Arterial: 7.483 — ABNORMAL HIGH (ref 7.35–7.45)
pH, Arterial: 7.267 — ABNORMAL LOW (ref 7.35–7.45)
pH, Arterial: 7.415 (ref 7.35–7.45)
pH, Arterial: 7.428 (ref 7.35–7.45)
pH, Arterial: 7.356 (ref 7.35–7.45)
pH, Arterial: 7.36 (ref 7.35–7.45)
pO2, Arterial: 148 mmHg — ABNORMAL HIGH (ref 83–108)
pO2, Arterial: 399 mmHg — ABNORMAL HIGH (ref 83–108)
pO2, Arterial: 292 mmHg — ABNORMAL HIGH (ref 83–108)
pO2, Arterial: 274 mmHg — ABNORMAL HIGH (ref 83–108)
pO2, Arterial: 71 mmHg — ABNORMAL LOW (ref 83–108)
pO2, Arterial: 90 mmHg (ref 83–108)
pO2, Arterial: 135 mmHg — ABNORMAL HIGH (ref 83–108)

## 2024-06-19 LAB — POCT I-STAT, CHEM 8
BUN: 14 mg/dL (ref 6–20)
BUN: 15 mg/dL (ref 6–20)
BUN: 15 mg/dL (ref 6–20)
BUN: 15 mg/dL (ref 6–20)
Calcium, Ion: 1.18 mmol/L (ref 1.15–1.40)
Calcium, Ion: 1.18 mmol/L (ref 1.15–1.40)
Calcium, Ion: 1.04 mmol/L — ABNORMAL LOW (ref 1.15–1.40)
Calcium, Ion: 1.05 mmol/L — ABNORMAL LOW (ref 1.15–1.40)
Chloride: 105 mmol/L (ref 98–111)
Chloride: 103 mmol/L (ref 98–111)
Chloride: 101 mmol/L (ref 98–111)
Chloride: 101 mmol/L (ref 98–111)
Creatinine, Ser: 0.8 mg/dL (ref 0.44–1.00)
Creatinine, Ser: 0.9 mg/dL (ref 0.44–1.00)
Creatinine, Ser: 1 mg/dL (ref 0.44–1.00)
Creatinine, Ser: 0.8 mg/dL (ref 0.44–1.00)
Glucose, Bld: 157 mg/dL — ABNORMAL HIGH (ref 70–99)
Glucose, Bld: 143 mg/dL — ABNORMAL HIGH (ref 70–99)
Glucose, Bld: 152 mg/dL — ABNORMAL HIGH (ref 70–99)
Glucose, Bld: 145 mg/dL — ABNORMAL HIGH (ref 70–99)
HCT: 33 % — ABNORMAL LOW (ref 36.0–46.0)
HCT: 32 % — ABNORMAL LOW (ref 36.0–46.0)
HCT: 23 % — ABNORMAL LOW (ref 36.0–46.0)
HCT: 20 % — ABNORMAL LOW (ref 36.0–46.0)
Hemoglobin: 11.2 g/dL — ABNORMAL LOW (ref 12.0–15.0)
Hemoglobin: 10.9 g/dL — ABNORMAL LOW (ref 12.0–15.0)
Hemoglobin: 7.8 g/dL — ABNORMAL LOW (ref 12.0–15.0)
Hemoglobin: 6.8 g/dL — CL (ref 12.0–15.0)
Potassium: 3.7 mmol/L (ref 3.5–5.1)
Potassium: 3.6 mmol/L (ref 3.5–5.1)
Potassium: 4.8 mmol/L (ref 3.5–5.1)
Potassium: 3.9 mmol/L (ref 3.5–5.1)
Sodium: 139 mmol/L (ref 135–145)
Sodium: 137 mmol/L (ref 135–145)
Sodium: 138 mmol/L (ref 135–145)
Sodium: 137 mmol/L (ref 135–145)
TCO2: 21 mmol/L — ABNORMAL LOW (ref 22–32)
TCO2: 22 mmol/L (ref 22–32)
TCO2: 29 mmol/L (ref 22–32)
TCO2: 23 mmol/L (ref 22–32)

## 2024-06-19 LAB — BASIC METABOLIC PANEL WITH GFR
Anion gap: 11 (ref 5–15)
Anion gap: 6 (ref 5–15)
BUN: 15 mg/dL (ref 6–20)
BUN: 14 mg/dL (ref 6–20)
CO2: 24 mmol/L (ref 22–32)
CO2: 22 mmol/L (ref 22–32)
Calcium: 8.9 mg/dL (ref 8.9–10.3)
Calcium: 7.6 mg/dL — ABNORMAL LOW (ref 8.9–10.3)
Chloride: 104 mmol/L (ref 98–111)
Chloride: 107 mmol/L (ref 98–111)
Creatinine, Ser: 1.19 mg/dL — ABNORMAL HIGH (ref 0.44–1.00)
Creatinine, Ser: 0.85 mg/dL (ref 0.44–1.00)
GFR, Estimated: 55 mL/min — ABNORMAL LOW (ref >60)
GFR, Estimated: >60 mL/min (ref >60)
Glucose, Bld: 119 mg/dL — ABNORMAL HIGH (ref 70–99)
Glucose, Bld: 138 mg/dL — ABNORMAL HIGH (ref 70–99)
Potassium: 3.5 mmol/L (ref 3.5–5.1)
Potassium: 3.9 mmol/L (ref 3.5–5.1)
Sodium: 139 mmol/L (ref 135–145)
Sodium: 135 mmol/L (ref 135–145)

## 2024-06-19 LAB — CBC
HCT: 37.6 % (ref 36.0–46.0)
HCT: 28.6 % — ABNORMAL LOW (ref 36.0–46.0)
HCT: 28.3 % — ABNORMAL LOW (ref 36.0–46.0)
Hemoglobin: 11.6 g/dL — ABNORMAL LOW (ref 12.0–15.0)
Hemoglobin: 9.1 g/dL — ABNORMAL LOW (ref 12.0–15.0)
Hemoglobin: 9.2 g/dL — ABNORMAL LOW (ref 12.0–15.0)
MCH: 28.4 pg (ref 26.0–34.0)
MCH: 28.5 pg (ref 26.0–34.0)
MCH: 29 pg (ref 26.0–34.0)
MCHC: 30.9 g/dL (ref 30.0–36.0)
MCHC: 31.8 g/dL (ref 30.0–36.0)
MCHC: 32.5 g/dL (ref 30.0–36.0)
MCV: 91.9 fL (ref 80.0–100.0)
MCV: 89.7 fL (ref 80.0–100.0)
MCV: 89.3 fL (ref 80.0–100.0)
Platelets: 230 K/uL (ref 150–400)
Platelets: 106 K/uL — ABNORMAL LOW (ref 150–400)
Platelets: 119 K/uL — ABNORMAL LOW (ref 150–400)
RBC: 4.09 MIL/uL (ref 3.87–5.11)
RBC: 3.19 MIL/uL — ABNORMAL LOW (ref 3.87–5.11)
RBC: 3.17 MIL/uL — ABNORMAL LOW (ref 3.87–5.11)
RDW: 13.3 % (ref 11.5–15.5)
RDW: 13.2 % (ref 11.5–15.5)
RDW: 13.6 % (ref 11.5–15.5)
WBC: 7.3 K/uL (ref 4.0–10.5)
WBC: 11.4 K/uL — ABNORMAL HIGH (ref 4.0–10.5)
WBC: 10 K/uL (ref 4.0–10.5)
nRBC: 0 % (ref 0.0–0.2)
nRBC: 0 % (ref 0.0–0.2)
nRBC: 0 % (ref 0.0–0.2)

## 2024-06-19 LAB — HEMOGLOBIN AND HEMATOCRIT, BLOOD
HCT: 24 % — ABNORMAL LOW (ref 36.0–46.0)
Hemoglobin: 7.6 g/dL — ABNORMAL LOW (ref 12.0–15.0)

## 2024-06-19 LAB — POCT I-STAT EG7
Acid-Base Excess: 0 mmol/L (ref 0.0–2.0)
Bicarbonate: 23.9 mmol/L (ref 20.0–28.0)
Calcium, Ion: 1.01 mmol/L — ABNORMAL LOW (ref 1.15–1.40)
HCT: 23 % — ABNORMAL LOW (ref 36.0–46.0)
Hemoglobin: 7.8 g/dL — ABNORMAL LOW (ref 12.0–15.0)
O2 Saturation: 82 %
Potassium: 3.5 mmol/L (ref 3.5–5.1)
Sodium: 138 mmol/L (ref 135–145)
TCO2: 25 mmol/L (ref 22–32)
pCO2, Ven: 36.4 mmHg — ABNORMAL LOW (ref 44–60)
pH, Ven: 7.426 (ref 7.25–7.43)
pO2, Ven: 45 mmHg (ref 32–45)

## 2024-06-19 LAB — RESP PANEL BY RT-PCR (RSV, FLU A&B, COVID)  RVPGX2
Influenza A by PCR: NEGATIVE
Influenza B by PCR: NEGATIVE
Resp Syncytial Virus by PCR: NEGATIVE
SARS Coronavirus 2 by RT PCR: NEGATIVE

## 2024-06-19 LAB — TROPONIN I (HIGH SENSITIVITY)
Troponin I (High Sensitivity): <2 ng/L (ref <18)
Troponin I (High Sensitivity): <2 ng/L (ref <18)

## 2024-06-19 LAB — MAGNESIUM: Magnesium: 3.8 mg/dL — ABNORMAL HIGH (ref 1.7–2.4)

## 2024-06-19 LAB — PLATELET COUNT: Platelets: 152 K/uL (ref 150–400)

## 2024-06-19 LAB — ECHO INTRAOPERATIVE TEE
Height: 69 in
Weight: 2455.04 [oz_av]

## 2024-06-19 LAB — APTT: aPTT: 33 s (ref 24–36)

## 2024-06-19 LAB — PROTIME-INR
INR: 1.3 — ABNORMAL HIGH (ref 0.8–1.2)
Prothrombin Time: 16.8 s — ABNORMAL HIGH (ref 11.4–15.2)

## 2024-06-19 LAB — FIBRINOGEN: Fibrinogen: 165 mg/dL — ABNORMAL LOW (ref 210–475)

## 2024-06-19 LAB — GLUCOSE, CAPILLARY
Glucose-Capillary: 170 mg/dL — ABNORMAL HIGH (ref 70–99)
Glucose-Capillary: 141 mg/dL — ABNORMAL HIGH (ref 70–99)
Glucose-Capillary: 147 mg/dL — ABNORMAL HIGH (ref 70–99)
Glucose-Capillary: 142 mg/dL — ABNORMAL HIGH (ref 70–99)
Glucose-Capillary: 117 mg/dL — ABNORMAL HIGH (ref 70–99)

## 2024-06-19 LAB — PREPARE RBC (CROSSMATCH)

## 2024-06-19 SURGERY — REPAIR, ANEURYSM, AORTA, THORACIC, ASCENDING
Anesthesia: General | Site: Chest

## 2024-06-19 MED ORDER — DEXMEDETOMIDINE HCL IN NACL 400 MCG/100ML IV SOLN
0.1000 ug/kg/h | INTRAVENOUS | Status: AC
Start: 2024-06-19 — End: 2024-06-19
  Administered 2024-06-19: .7 ug/kg/h via INTRAVENOUS
  Filled 2024-06-19: qty 100

## 2024-06-19 MED ORDER — DEXMEDETOMIDINE HCL IN NACL 400 MCG/100ML IV SOLN
INTRAVENOUS | Status: AC
Start: 2024-06-19 — End: 2024-06-19
  Filled 2024-06-19: qty 100

## 2024-06-19 MED ORDER — HEMOSTATIC AGENTS (NO CHARGE) OPTIME
TOPICAL | Status: DC | PRN
Start: 2024-06-19 — End: 2024-06-19
  Administered 2024-06-19: 1 via TOPICAL

## 2024-06-19 MED ORDER — HEPARIN 30,000 UNITS/1000 ML (OHS) CELLSAVER SOLUTION
Status: DC
  Filled 2024-06-19: qty 1000

## 2024-06-19 MED ORDER — HEPARIN SODIUM (PORCINE) 1000 UNIT/ML IJ SOLN
INTRAMUSCULAR | Status: AC
  Filled 2024-06-19: qty 1

## 2024-06-19 MED ORDER — INSULIN REGULAR(HUMAN) IN NACL 100-0.9 UT/100ML-% IV SOLN
INTRAVENOUS | Status: DC
Start: 2024-06-19 — End: 2024-06-20

## 2024-06-19 MED ORDER — HEMOSTATIC AGENTS (NO CHARGE) OPTIME
TOPICAL | Status: DC | PRN
  Administered 2024-06-19: 1 via TOPICAL

## 2024-06-19 MED ORDER — ONDANSETRON HCL 4 MG/2ML IJ SOLN
INTRAMUSCULAR | Status: AC
  Filled 2024-06-19: qty 2

## 2024-06-19 MED ORDER — METOPROLOL TARTRATE 25 MG/10 ML ORAL SUSPENSION: 12.5000 mg | Freq: Two times a day (BID) | ORAL | Status: DC

## 2024-06-19 MED ORDER — SUCCINYLCHOLINE CHLORIDE 200 MG/10ML IV SOSY
PREFILLED_SYRINGE | INTRAVENOUS | Status: AC
  Filled 2024-06-19: qty 10

## 2024-06-19 MED ORDER — ESMOLOL HCL 100 MG/10ML IV SOLN
INTRAVENOUS | Status: AC
  Filled 2024-06-19: qty 10

## 2024-06-19 MED ORDER — MIDAZOLAM HCL 2 MG/2ML IJ SOLN
INTRAMUSCULAR | Status: DC | PRN
  Administered 2024-06-19 (×3): 2 mg via INTRAVENOUS

## 2024-06-19 MED ORDER — LACTATED RINGERS IV SOLN
INTRAVENOUS | Status: AC
Start: 2024-06-19 — End: 2024-06-20

## 2024-06-19 MED ORDER — METHYLPREDNISOLONE SODIUM SUCC 125 MG IJ SOLR
INTRAMUSCULAR | Status: AC
  Filled 2024-06-19: qty 2

## 2024-06-19 MED ORDER — VANCOMYCIN HCL IN DEXTROSE 1-5 GM/200ML-% IV SOLN
1000.0000 mg | Freq: Once | INTRAVENOUS | Status: AC
  Administered 2024-06-19: 1000 mg via INTRAVENOUS
  Filled 2024-06-19: qty 200

## 2024-06-19 MED ORDER — CLEVIDIPINE BUTYRATE 0.5 MG/ML IV EMUL
INTRAVENOUS | Status: AC
  Filled 2024-06-19: qty 50

## 2024-06-19 MED ORDER — CLEVIDIPINE BUTYRATE 0.5 MG/ML IV EMUL
0.0000 mg/h | INTRAVENOUS | Status: DC
  Administered 2024-06-19: 4 mg/h via INTRAVENOUS
  Administered 2024-06-19: 12 mg/h via INTRAVENOUS
  Administered 2024-06-19: 3 mg/h via INTRAVENOUS
  Filled 2024-06-19 (×2): qty 100

## 2024-06-19 MED ORDER — SODIUM CHLORIDE 0.45 % IV SOLN: INTRAVENOUS | Status: AC | PRN

## 2024-06-19 MED ORDER — BISACODYL 5 MG PO TBEC
10.0000 mg | DELAYED_RELEASE_TABLET | Freq: Every day | ORAL | Status: DC
  Administered 2024-06-20 – 2024-06-21 (×2): 10 mg via ORAL
  Filled 2024-06-19 (×2): qty 2

## 2024-06-19 MED ORDER — SODIUM CHLORIDE 0.9 % IV SOLN
250.0000 mL | INTRAVENOUS | Status: AC
  Administered 2024-06-20: 250 mL via INTRAVENOUS

## 2024-06-19 MED ORDER — ETOMIDATE 2 MG/ML IV SOLN
INTRAVENOUS | Status: AC
Start: 2024-06-19 — End: 2024-06-19
  Filled 2024-06-19: qty 10

## 2024-06-19 MED ORDER — TRANEXAMIC ACID (OHS) PUMP PRIME SOLUTION
2.0000 mg/kg | INTRAVENOUS | Status: DC
  Filled 2024-06-19: qty 1.39

## 2024-06-19 MED ORDER — METHYLPREDNISOLONE SODIUM SUCC 125 MG IJ SOLR
INTRAMUSCULAR | Status: DC | PRN
Start: 2024-06-19 — End: 2024-06-19
  Administered 2024-06-19: 125 mg via INTRAVENOUS

## 2024-06-19 MED ORDER — CHLORHEXIDINE GLUCONATE CLOTH 2 % EX PADS: 6.0000 | MEDICATED_PAD | Freq: Once | CUTANEOUS | Status: DC

## 2024-06-19 MED ORDER — MIDAZOLAM HCL 2 MG/2ML IJ SOLN
INTRAMUSCULAR | Status: AC
  Filled 2024-06-19: qty 2

## 2024-06-19 MED ORDER — NOREPINEPHRINE 4 MG/250ML-% IV SOLN
0.0000 ug/min | INTRAVENOUS | Status: AC
Start: 2024-06-19 — End: 2024-06-19
  Administered 2024-06-19: 3 ug/min via INTRAVENOUS
  Filled 2024-06-19: qty 250

## 2024-06-19 MED ORDER — METOPROLOL TARTRATE 5 MG/5ML IV SOLN: 2.5000 mg | INTRAVENOUS | Status: DC | PRN

## 2024-06-19 MED ORDER — ONDANSETRON HCL 4 MG/2ML IJ SOLN
INTRAMUSCULAR | Status: DC | PRN
  Administered 2024-06-19: 4 mg via INTRAVENOUS

## 2024-06-19 MED ORDER — LACTATED RINGERS IV SOLN: INTRAVENOUS | Status: AC

## 2024-06-19 MED ORDER — MIDAZOLAM HCL 2 MG/2ML IJ SOLN: 2.0000 mg | INTRAMUSCULAR | Status: DC | PRN

## 2024-06-19 MED ORDER — NITROGLYCERIN IN D5W 200-5 MCG/ML-% IV SOLN
2.0000 ug/min | INTRAVENOUS | Status: AC
Start: 2024-06-19 — End: 2024-06-19
  Administered 2024-06-19: 5 ug/min via INTRAVENOUS
  Filled 2024-06-19: qty 250

## 2024-06-19 MED ORDER — METOPROLOL TARTRATE 12.5 MG HALF TABLET
12.5000 mg | ORAL_TABLET | Freq: Two times a day (BID) | ORAL | Status: DC
  Administered 2024-06-19 – 2024-06-20 (×2): 12.5 mg via ORAL
  Filled 2024-06-19 (×2): qty 1

## 2024-06-19 MED ORDER — METOCLOPRAMIDE HCL 5 MG/ML IJ SOLN
10.0000 mg | Freq: Four times a day (QID) | INTRAMUSCULAR | Status: AC
  Administered 2024-06-19 – 2024-06-20 (×4): 10 mg via INTRAVENOUS
  Filled 2024-06-19 (×4): qty 2

## 2024-06-19 MED ORDER — INSULIN REGULAR(HUMAN) IN NACL 100-0.9 UT/100ML-% IV SOLN
INTRAVENOUS | Status: AC
  Administered 2024-06-19: 1.8 [IU]/h via INTRAVENOUS
  Filled 2024-06-19 (×2): qty 100

## 2024-06-19 MED ORDER — ETOMIDATE 2 MG/ML IV SOLN
INTRAVENOUS | Status: AC
  Filled 2024-06-19: qty 10

## 2024-06-19 MED ORDER — BISACODYL 10 MG RE SUPP: 10.0000 mg | Freq: Every day | RECTAL | Status: DC

## 2024-06-19 MED ORDER — FENTANYL CITRATE PF 50 MCG/ML IJ SOSY
50.0000 ug | PREFILLED_SYRINGE | Freq: Once | INTRAMUSCULAR | Status: DC
Start: 2024-06-19 — End: 2024-06-19
  Filled 2024-06-19: qty 1

## 2024-06-19 MED ORDER — LACTATED RINGERS IV SOLN: INTRAVENOUS | Status: DC | PRN

## 2024-06-19 MED ORDER — NITROGLYCERIN IN D5W 200-5 MCG/ML-% IV SOLN: 0.0000 ug/min | INTRAVENOUS | Status: DC

## 2024-06-19 MED ORDER — PANTOPRAZOLE SODIUM 40 MG IV SOLR
40.0000 mg | Freq: Every day | INTRAVENOUS | Status: AC
  Administered 2024-06-19 – 2024-06-20 (×2): 40 mg via INTRAVENOUS
  Filled 2024-06-19 (×2): qty 10

## 2024-06-19 MED ORDER — SODIUM CHLORIDE 0.9 % IV SOLN: INTRAVENOUS | Status: AC

## 2024-06-19 MED ORDER — PROPOFOL 10 MG/ML IV BOLUS
INTRAVENOUS | Status: AC
  Filled 2024-06-19: qty 20

## 2024-06-19 MED ORDER — CHLORHEXIDINE GLUCONATE CLOTH 2 % EX PADS
6.0000 | MEDICATED_PAD | Freq: Every day | CUTANEOUS | Status: DC
  Administered 2024-06-19 – 2024-06-21 (×3): 6 via TOPICAL

## 2024-06-19 MED ORDER — MANNITOL 20 % IV SOLN
INTRAVENOUS | Status: DC
  Filled 2024-06-19 (×2): qty 13

## 2024-06-19 MED ORDER — TRANEXAMIC ACID (OHS) BOLUS VIA INFUSION
15.0000 mg/kg | INTRAVENOUS | Status: AC
  Administered 2024-06-19: 1044 mg via INTRAVENOUS
  Filled 2024-06-19: qty 1044

## 2024-06-19 MED ORDER — MORPHINE SULFATE (PF) 2 MG/ML IV SOLN
1.0000 mg | INTRAVENOUS | Status: DC | PRN
  Administered 2024-06-19 (×2): 2 mg via INTRAVENOUS
  Administered 2024-06-19: 1 mg via INTRAVENOUS
  Administered 2024-06-19: 2 mg via INTRAVENOUS
  Filled 2024-06-19 (×4): qty 1

## 2024-06-19 MED ORDER — SUCCINYLCHOLINE CHLORIDE 200 MG/10ML IV SOSY
PREFILLED_SYRINGE | INTRAVENOUS | Status: DC | PRN
  Administered 2024-06-19: 100 mg via INTRAVENOUS

## 2024-06-19 MED ORDER — SODIUM CHLORIDE 0.9% FLUSH: 3.0000 mL | INTRAVENOUS | Status: DC | PRN

## 2024-06-19 MED ORDER — MAGNESIUM SULFATE 4 GM/100ML IV SOLN
4.0000 g | Freq: Once | INTRAVENOUS | Status: AC
  Administered 2024-06-19: 4 g via INTRAVENOUS
  Filled 2024-06-19: qty 100

## 2024-06-19 MED ORDER — BISACODYL 5 MG PO TBEC
5.0000 mg | DELAYED_RELEASE_TABLET | Freq: Once | ORAL | Status: DC
  Filled 2024-06-19: qty 1

## 2024-06-19 MED ORDER — CEFAZOLIN SODIUM-DEXTROSE 2-4 GM/100ML-% IV SOLN
2.0000 g | INTRAVENOUS | Status: DC
  Filled 2024-06-19 (×2): qty 100

## 2024-06-19 MED ORDER — TRAMADOL HCL 50 MG PO TABS
50.0000 mg | ORAL_TABLET | ORAL | Status: DC | PRN
  Administered 2024-06-20: 50 mg via ORAL
  Filled 2024-06-19: qty 1

## 2024-06-19 MED ORDER — MILRINONE LACTATE IN DEXTROSE 20-5 MG/100ML-% IV SOLN
0.3000 ug/kg/min | INTRAVENOUS | Status: DC
  Filled 2024-06-19: qty 100

## 2024-06-19 MED ORDER — VANCOMYCIN HCL 1250 MG/250ML IV SOLN
1250.0000 mg | INTRAVENOUS | Status: AC
  Administered 2024-06-19: 1250 mg via INTRAVENOUS
  Filled 2024-06-19: qty 250

## 2024-06-19 MED ORDER — 0.9 % SODIUM CHLORIDE (POUR BTL) OPTIME
TOPICAL | Status: DC | PRN
Start: 2024-06-19 — End: 2024-06-19
  Administered 2024-06-19: 5000 mL

## 2024-06-19 MED ORDER — TRANEXAMIC ACID 1000 MG/10ML IV SOLN
1.5000 mg/kg/h | INTRAVENOUS | Status: AC
  Administered 2024-06-19: 1.5 mg/kg/h via INTRAVENOUS
  Filled 2024-06-19: qty 25

## 2024-06-19 MED ORDER — CEFAZOLIN SODIUM-DEXTROSE 2-4 GM/100ML-% IV SOLN
2.0000 g | INTRAVENOUS | Status: AC
  Administered 2024-06-19 (×2): 2 g via INTRAVENOUS
  Filled 2024-06-19: qty 100

## 2024-06-19 MED ORDER — ONDANSETRON 4 MG PO TBDP
4.0000 mg | ORAL_TABLET | Freq: Once | ORAL | Status: AC
  Administered 2024-06-19: 4 mg via ORAL
  Filled 2024-06-19: qty 1

## 2024-06-19 MED ORDER — MAGNESIUM SULFATE 50 % IJ SOLN
40.0000 meq | INTRAMUSCULAR | Status: DC
  Filled 2024-06-19: qty 9.85

## 2024-06-19 MED ORDER — HEPARIN SODIUM (PORCINE) 1000 UNIT/ML IJ SOLN
INTRAMUSCULAR | Status: DC | PRN
  Administered 2024-06-19: 26000 [IU] via INTRAVENOUS

## 2024-06-19 MED ORDER — MORPHINE SULFATE (PF) 4 MG/ML IV SOLN
4.0000 mg | Freq: Once | INTRAVENOUS | Status: AC
Start: 2024-06-19 — End: 2024-06-19
  Administered 2024-06-19: 4 mg via INTRAVENOUS
  Filled 2024-06-19: qty 1

## 2024-06-19 MED ORDER — PROTAMINE SULFATE 10 MG/ML IV SOLN
INTRAVENOUS | Status: AC
  Filled 2024-06-19: qty 25

## 2024-06-19 MED ORDER — CLEVIDIPINE BUTYRATE 0.5 MG/ML IV EMUL
INTRAVENOUS | Status: DC | PRN
  Administered 2024-06-19: 2 mg/h via INTRAVENOUS

## 2024-06-19 MED ORDER — DOCUSATE SODIUM 100 MG PO CAPS
200.0000 mg | ORAL_CAPSULE | Freq: Every day | ORAL | Status: DC
  Administered 2024-06-20 – 2024-06-22 (×3): 200 mg via ORAL
  Filled 2024-06-19 (×3): qty 2

## 2024-06-19 MED ORDER — FENTANYL CITRATE (PF) 250 MCG/5ML IJ SOLN
INTRAMUSCULAR | Status: AC
  Filled 2024-06-19: qty 5

## 2024-06-19 MED ORDER — PHENYLEPHRINE HCL-NACL 20-0.9 MG/250ML-% IV SOLN
30.0000 ug/min | INTRAVENOUS | Status: AC
Start: 2024-06-19 — End: 2024-06-20
  Administered 2024-06-19: 40 ug via INTRAVENOUS
  Administered 2024-06-19: 25 ug/min via INTRAVENOUS
  Administered 2024-06-19: 40 ug via INTRAVENOUS
  Filled 2024-06-19: qty 250

## 2024-06-19 MED ORDER — ALBUMIN HUMAN 5 % IV SOLN
250.0000 mL | INTRAVENOUS | Status: DC | PRN
  Administered 2024-06-19 – 2024-06-20 (×2): 12.5 g via INTRAVENOUS
  Filled 2024-06-19: qty 250

## 2024-06-19 MED ORDER — FENTANYL CITRATE PF 50 MCG/ML IJ SOSY
50.0000 ug | PREFILLED_SYRINGE | INTRAMUSCULAR | Status: DC | PRN
  Administered 2024-06-19: 50 ug via INTRAVENOUS

## 2024-06-19 MED ORDER — SODIUM CHLORIDE 0.9% IV SOLUTION: Freq: Once | INTRAVENOUS | Status: DC

## 2024-06-19 MED ORDER — FENTANYL CITRATE (PF) 250 MCG/5ML IJ SOLN
INTRAMUSCULAR | Status: DC | PRN
  Administered 2024-06-19: 50 ug via INTRAVENOUS
  Administered 2024-06-19: 225 ug via INTRAVENOUS
  Administered 2024-06-19: 50 ug via INTRAVENOUS
  Administered 2024-06-19: 250 ug via INTRAVENOUS
  Administered 2024-06-19: 50 ug via INTRAVENOUS
  Administered 2024-06-19: 25 ug via INTRAVENOUS
  Administered 2024-06-19 (×2): 50 ug via INTRAVENOUS

## 2024-06-19 MED ORDER — PHENYLEPHRINE HCL-NACL 20-0.9 MG/250ML-% IV SOLN
0.0000 ug/min | INTRAVENOUS | Status: DC
  Administered 2024-06-19: 0 ug/min via INTRAVENOUS

## 2024-06-19 MED ORDER — ONDANSETRON HCL 4 MG/2ML IJ SOLN: 4.0000 mg | Freq: Four times a day (QID) | INTRAMUSCULAR | Status: DC | PRN

## 2024-06-19 MED ORDER — IOHEXOL 350 MG/ML SOLN
75.0000 mL | Freq: Once | INTRAVENOUS | Status: AC | PRN
  Administered 2024-06-19: 75 mL via INTRAVENOUS

## 2024-06-19 MED ORDER — CHLORHEXIDINE GLUCONATE 0.12 % MT SOLN: 15.0000 mL | Freq: Once | OROMUCOSAL | Status: DC

## 2024-06-19 MED ORDER — POTASSIUM CHLORIDE 2 MEQ/ML IV SOLN
80.0000 meq | INTRAVENOUS | Status: DC
  Filled 2024-06-19 (×2): qty 40

## 2024-06-19 MED ORDER — ACETAMINOPHEN 160 MG/5ML PO SOLN: 1000.0000 mg | Freq: Four times a day (QID) | ORAL | Status: DC

## 2024-06-19 MED ORDER — PROTAMINE SULFATE 10 MG/ML IV SOLN
INTRAVENOUS | Status: DC | PRN
  Administered 2024-06-19: 250 mg via INTRAVENOUS

## 2024-06-19 MED ORDER — ACETAMINOPHEN 500 MG PO TABS
1000.0000 mg | ORAL_TABLET | Freq: Four times a day (QID) | ORAL | Status: DC
  Administered 2024-06-19 – 2024-06-23 (×14): 1000 mg via ORAL
  Filled 2024-06-19 (×13): qty 2

## 2024-06-19 MED ORDER — ARTIFICIAL TEARS OPHTHALMIC OINT
TOPICAL_OINTMENT | OPHTHALMIC | Status: DC | PRN
  Administered 2024-06-19: 1 via OPHTHALMIC

## 2024-06-19 MED ORDER — SODIUM CHLORIDE 0.9 % IV SOLN: INTRAVENOUS | Status: DC | PRN

## 2024-06-19 MED ORDER — ARTIFICIAL TEARS OPHTHALMIC OINT
TOPICAL_OINTMENT | OPHTHALMIC | Status: AC
  Filled 2024-06-19: qty 3.5

## 2024-06-19 MED ORDER — PANTOPRAZOLE SODIUM 40 MG PO TBEC
40.0000 mg | DELAYED_RELEASE_TABLET | Freq: Every day | ORAL | Status: DC
  Administered 2024-06-21: 40 mg via ORAL
  Filled 2024-06-19: qty 1

## 2024-06-19 MED ORDER — PROPOFOL 10 MG/ML IV BOLUS
INTRAVENOUS | Status: DC | PRN
  Administered 2024-06-19: 100 mg via INTRAVENOUS
  Administered 2024-06-19: 20 mg via INTRAVENOUS
  Administered 2024-06-19: 80 mg via INTRAVENOUS

## 2024-06-19 MED ORDER — ALBUMIN HUMAN 5 % IV SOLN: INTRAVENOUS | Status: DC | PRN

## 2024-06-19 MED ORDER — ROCURONIUM BROMIDE 10 MG/ML (PF) SYRINGE
PREFILLED_SYRINGE | INTRAVENOUS | Status: AC
  Filled 2024-06-19: qty 10

## 2024-06-19 MED ORDER — CHLORHEXIDINE GLUCONATE 0.12 % MT SOLN
15.0000 mL | OROMUCOSAL | Status: AC
Start: 2024-06-19 — End: 2024-06-19
  Administered 2024-06-19: 15 mL via OROMUCOSAL
  Filled 2024-06-19: qty 15

## 2024-06-19 MED ORDER — METOPROLOL TARTRATE 25 MG PO TABS
12.5000 mg | ORAL_TABLET | Freq: Once | ORAL | Status: DC
Start: 2024-06-19 — End: 2024-06-19

## 2024-06-19 MED ORDER — POTASSIUM CHLORIDE 10 MEQ/50ML IV SOLN
10.0000 meq | INTRAVENOUS | Status: AC
  Administered 2024-06-19 (×3): 10 meq via INTRAVENOUS

## 2024-06-19 MED ORDER — SODIUM CHLORIDE 0.9% FLUSH
3.0000 mL | Freq: Two times a day (BID) | INTRAVENOUS | Status: DC
  Administered 2024-06-20 – 2024-06-21 (×3): 3 mL via INTRAVENOUS

## 2024-06-19 MED ORDER — TEMAZEPAM 15 MG PO CAPS: 15.0000 mg | ORAL_CAPSULE | Freq: Once | ORAL | Status: DC | PRN

## 2024-06-19 MED ORDER — DEXTROSE 50 % IV SOLN: 0.0000 mL | INTRAVENOUS | Status: DC | PRN

## 2024-06-19 MED ORDER — ROCURONIUM BROMIDE 10 MG/ML (PF) SYRINGE
PREFILLED_SYRINGE | INTRAVENOUS | Status: DC | PRN
Start: 2024-06-19 — End: 2024-06-19
  Administered 2024-06-19: 10 mg via INTRAVENOUS
  Administered 2024-06-19: 100 mg via INTRAVENOUS

## 2024-06-19 MED ORDER — ALUM & MAG HYDROXIDE-SIMETH 200-200-20 MG/5ML PO SUSP
30.0000 mL | Freq: Once | ORAL | Status: AC
  Administered 2024-06-19: 30 mL via ORAL
  Filled 2024-06-19: qty 30

## 2024-06-19 MED ORDER — PHENYLEPHRINE HCL-NACL 20-0.9 MG/250ML-% IV SOLN
INTRAVENOUS | Status: AC
Start: 2024-06-19 — End: 2024-06-19
  Filled 2024-06-19: qty 250

## 2024-06-19 MED ORDER — THROMBIN 20000 UNITS EX SOLR: OROMUCOSAL | Status: DC | PRN

## 2024-06-19 MED ORDER — CEFAZOLIN SODIUM-DEXTROSE 2-4 GM/100ML-% IV SOLN
2.0000 g | Freq: Three times a day (TID) | INTRAVENOUS | Status: AC
  Administered 2024-06-19 – 2024-06-21 (×6): 2 g via INTRAVENOUS
  Filled 2024-06-19 (×4): qty 100

## 2024-06-19 MED ORDER — ACETAMINOPHEN 325 MG PO TABS
650.0000 mg | ORAL_TABLET | Freq: Once | ORAL | Status: AC
  Administered 2024-06-19: 650 mg via ORAL
  Filled 2024-06-19: qty 2

## 2024-06-19 MED ORDER — ACETAMINOPHEN 160 MG/5ML PO SOLN: 650.0000 mg | Freq: Once | ORAL | Status: DC

## 2024-06-19 MED ORDER — NITROGLYCERIN 0.2 MG/ML ON CALL CATH LAB
INTRAVENOUS | Status: DC | PRN
  Administered 2024-06-19 (×4): 40 ug via INTRAVENOUS

## 2024-06-19 MED ORDER — EPINEPHRINE HCL 5 MG/250ML IV SOLN IN NS
0.0000 ug/min | INTRAVENOUS | Status: DC
  Filled 2024-06-19: qty 250

## 2024-06-19 MED ORDER — EPINEPHRINE HCL 5 MG/250ML IV SOLN IN NS
INTRAVENOUS | Status: AC
Start: 2024-06-19 — End: 2024-06-19
  Filled 2024-06-19: qty 250

## 2024-06-19 MED ORDER — DEXMEDETOMIDINE HCL IN NACL 400 MCG/100ML IV SOLN
0.0000 ug/kg/h | INTRAVENOUS | Status: DC
  Administered 2024-06-19: 0.7 ug/kg/h via INTRAVENOUS

## 2024-06-19 MED ORDER — THROMBIN (RECOMBINANT) 20000 UNITS EX SOLR
CUTANEOUS | Status: AC
  Filled 2024-06-19: qty 20000

## 2024-06-19 MED ORDER — OXYCODONE HCL 5 MG PO TABS
5.0000 mg | ORAL_TABLET | ORAL | Status: DC | PRN
  Administered 2024-06-19: 10 mg via ORAL
  Filled 2024-06-19: qty 2

## 2024-06-19 MED ORDER — PLASMA-LYTE A IV SOLN
INTRAVENOUS | Status: AC
  Filled 2024-06-19: qty 2.5

## 2024-06-19 SURGICAL SUPPLY — 73 items
ADAPTER CARDIO PERF ANTE/RETRO (ADAPTER) ×2 IMPLANT
APPLICATOR PREVELEAK SEALANT (TIP) ×1 IMPLANT
BAG DECANTER FOR FLEXI CONT (MISCELLANEOUS) ×2 IMPLANT
BLADE CLIPPER SURG (BLADE) ×2 IMPLANT
BLADE STERNUM SYSTEM 6 (BLADE) ×2 IMPLANT
BLADE SURG 15 STRL LF DISP TIS (BLADE) ×2 IMPLANT
CANISTER SUCTION 3000ML PPV (SUCTIONS) ×2 IMPLANT
CANNULA GUNDRY RCSP 15FR (MISCELLANEOUS) ×2 IMPLANT
CANNULA MC2 2 STG 36/46 NON-V (CANNULA) ×1 IMPLANT
CATH HEART VENT LEFT (CATHETERS) ×2 IMPLANT
CATH ROBINSON RED A/P 18FR (CATHETERS) ×6 IMPLANT
CATH THORACIC 36FR (CATHETERS) ×2 IMPLANT
CATH THORACIC 36FR RT ANG (CATHETERS) ×2 IMPLANT
CONN Y 3/8X3/8X3/8 BEN (MISCELLANEOUS) ×1 IMPLANT
DRAPE WARM FLUID 44X44 (DRAPES) IMPLANT
DRSG COVADERM 4X14 (GAUZE/BANDAGES/DRESSINGS) ×2 IMPLANT
DRSG COVADERM 4X6 (GAUZE/BANDAGES/DRESSINGS) ×1 IMPLANT
ELECT CAUTERY BLADE 6.4 (BLADE) ×2 IMPLANT
ELECTRODE BLDE 4.0 EZ CLN MEGD (MISCELLANEOUS) ×1 IMPLANT
ELECTRODE REM PT RTRN 9FT ADLT (ELECTROSURGICAL) ×4 IMPLANT
FELT TEFLON 6X6 (MISCELLANEOUS) ×1 IMPLANT
GAUZE SPONGE 4X4 12PLY STRL (GAUZE/BANDAGES/DRESSINGS) ×3 IMPLANT
GLOVE BIO SURGEON STRL SZ 6 (GLOVE) IMPLANT
GLOVE BIO SURGEON STRL SZ 6.5 (GLOVE) IMPLANT
GLOVE BIO SURGEON STRL SZ7 (GLOVE) IMPLANT
GLOVE BIO SURGEON STRL SZ7.5 (GLOVE) IMPLANT
GLOVE SURG MICRO LTX SZ7 (GLOVE) ×4 IMPLANT
GOWN STRL REUS W/ TWL LRG LVL3 (GOWN DISPOSABLE) ×8 IMPLANT
GOWN STRL REUS W/ TWL XL LVL3 (GOWN DISPOSABLE) ×2 IMPLANT
GRAFT CV 30X8WVN NDL (Graft) ×1 IMPLANT
GRAFT WOVEN D/V 32DX30L (Vascular Products) ×1 IMPLANT
HEMOSTAT POWDER SURGIFOAM 1G (HEMOSTASIS) ×5 IMPLANT
HEMOSTAT SURGICEL 2X14 (HEMOSTASIS) ×2 IMPLANT
INSERT FOGARTY SM (MISCELLANEOUS) ×2 IMPLANT
INSERT FOGARTY XLG (MISCELLANEOUS) ×1 IMPLANT
KIT BASIN OR (CUSTOM PROCEDURE TRAY) ×2 IMPLANT
KIT CATH CPB BARTLE (MISCELLANEOUS) IMPLANT
KIT SUCTION CATH 14FR (SUCTIONS) ×2 IMPLANT
KIT TURNOVER KIT B (KITS) ×2 IMPLANT
LINE VENT (MISCELLANEOUS) ×1 IMPLANT
NDL AORTIC AIR ASPIRATING (NEEDLE) IMPLANT
NEEDLE AORTIC AIR ASPIRATING (NEEDLE) IMPLANT
NS IRRIG 1000ML POUR BTL (IV SOLUTION) ×10 IMPLANT
PACK E OPEN HEART (SUTURE) ×2 IMPLANT
PACK OPEN HEART (CUSTOM PROCEDURE TRAY) ×2 IMPLANT
PAD ARMBOARD POSITIONER FOAM (MISCELLANEOUS) ×4 IMPLANT
POSITIONER HEAD DONUT 9IN (MISCELLANEOUS) ×2 IMPLANT
SEALANT HEMOST PREVELEAK 4ML (HEMOSTASIS) ×1 IMPLANT
SET MPS 3-ND DEL (MISCELLANEOUS) ×1 IMPLANT
SPONGE T-LAP 4X18 ~~LOC~~+RFID (SPONGE) ×1 IMPLANT
STOPCOCK 4 WAY LG BORE MALE ST (IV SETS) IMPLANT
SUT ETHIBOND 2 0 SH 36X2 (SUTURE) ×1 IMPLANT
SUT PROLENE 3 0 SH 48 (SUTURE) ×1 IMPLANT
SUT PROLENE 3 0 SH DA (SUTURE) ×2 IMPLANT
SUT PROLENE 3 0 SH1 36 (SUTURE) IMPLANT
SUT PROLENE 4 0 SH DA (SUTURE) IMPLANT
SUT PROLENE 4-0 RB1 .5 CRCL 36 (SUTURE) ×6 IMPLANT
SUT PROLENE 5 0 C 1 36 (SUTURE) ×1 IMPLANT
SUT PROLENE 5 0 C1 (SUTURE) ×2 IMPLANT
SUT PROLENE 6 0 C 1 30 (SUTURE) IMPLANT
SUT SILK 2 0 SH CR/8 (SUTURE) ×1 IMPLANT
SUT STEEL 6MS V (SUTURE) ×2 IMPLANT
SUT VIC AB 1 CTX36XBRD ANBCTR (SUTURE) ×4 IMPLANT
SUT VIC AB 3-0 X1 27 (SUTURE) ×2 IMPLANT
SYR 10ML KIT SKIN ADHESIVE (MISCELLANEOUS) ×1 IMPLANT
SYSTEM SAHARA CHEST DRAIN ATS (WOUND CARE) ×2 IMPLANT
TAPE CLOTH SURG 4X10 WHT LF (GAUZE/BANDAGES/DRESSINGS) ×1 IMPLANT
TAPE PAPER 2X10 WHT MICROPORE (GAUZE/BANDAGES/DRESSINGS) ×1 IMPLANT
TOWEL GREEN STERILE (TOWEL DISPOSABLE) ×4 IMPLANT
TOWEL GREEN STERILE FF (TOWEL DISPOSABLE) ×4 IMPLANT
TUBE CONNECTING 12X1/4 (SUCTIONS) ×1 IMPLANT
WATER STERILE IRR 1000ML POUR (IV SOLUTION) ×4 IMPLANT
YANKAUER SUCT BULB TIP NO VENT (SUCTIONS) ×1 IMPLANT

## 2024-06-19 NOTE — Op Note (Addendum)
 CARDIOVASCULAR SURGERY OPERATIVE NOTE  06/19/2024  Surgeon:  Dorise LOIS Fellers, MD  First Assistant: Con Bend,  PA-C   Preoperative Diagnosis:  Acute Type A aortic dissection   Postoperative Diagnosis:  Same   Procedure:  Median Sternotomy Extracorporeal circulation 3.   Replacement of the ascending aorta (hemi-arch) using a 32 mm Hemashield graft under deep hypothermic circulatory arrest, supra-coronary proximal anastomosis.   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 The distal extent was in the proximal arch and it extended proximally into the non-coronary sinus. There was some blood in the mediastinum and a small pericardial effusion. She was taken emergently to the OR for repair.    Preparation:  The patient was seen in the preoperative holding area and the correct patient, correct operation were confirmed with the patient after reviewing the medical record and catheterization. The patient was in severe pain and delirious and not able to sign a consent.  Preoperative antibiotics were given. The patient was taken back to the operating room and positioned supine on the operating room table.  A pulmonary arterial line and radial arterial line were placed by the anesthesia team.  After being placed under general endotracheal anesthesia by the anesthesia team a foley catheter was placed. The neck, chest, abdomen, and both legs were prepped with betadine  soap and solution and draped in the usual sterile manner. A surgical time-out was taken and the correct patient and operative procedure were confirmed with the nursing and anesthesia staff.  TEE:  Performed by Dr. Marney Needle. This  showed normal LV systolic function. The aortic valve was trileaflet with trivial central AI.   Right axillary artery exposure and cannulation: Assisted by Con Bend, PA-C  A transverse incision was made below the right clavicle. The pectoralis major muscle was split along its fibers and the pectoralis minor muscle was retracted laterally. The brachial plexus was identified and gently retracted laterally to expose the axillary artery. The artery was controlled proximally and distally with vessel loops. The patient was fully heparinized and ACT maintained greater than 400. The axillary artery was clamped proximally and distally with peripheral Debakey clamps. It was opened longitudinally. There was no sign of dissection here. An 8 mm Hemashield dacron graft was anastomosed in an end to side manner using continuous 5-0 prolene suture. Prevaleak was applied for hemostasis and the clamp removed. The graft was connected to the arterial end of the bypass circuit.   Cardiopulmonary Bypass:  A median sternotomy was performed. The pericardium was opened in the midline. There was blood in the pericardium. Right ventricular function appeared normal. The ascending aorta was aneurysmal and ecchymotic. Venous cannulation was performed via the right atrial appendage using a two-staged venous cannula. A temperature probe was inserted into the interventricular septum and an insulating pad was placed in the pericardium. CO2 was insufflated into the pericardium throughout the case to minimize intracardiac air.    Resection and grafting of ascending aortic aneurysm: Assisted by Con Bend, PA-C  The patient was placed on cardiopulmonary bypass and a left ventricular vent was placed via the right superior pulmonary vein. Systemic cooling was begun with a goal temperature of 22 degrees centigrade by bladder and venous return  temperature probes. A retrograde cardioplegia cannula was placed through the right atrium  into the coronary sinus without difficulty. The Bovine trunk was isolated and encircled with a silastic loop. After 20 minutes of cooling the target temperature of 22 degrees centigrade was reached. Cerebral oximetry was 70% bilaterally. BIS was zero. The patient was given Propofol  and 125 mg of Solumedrol. The head was packed in ice. The bed was placed in steep trendelenburg. Circulatory arrest was begun and the blood volume emptied into the venous reservoir. The bovine trunk was clamped proximally before its bifurcation.  Continuous antegrade cerebral perfusion was begun via the axillary cannula.  Cold retrograde KBC cardioplegia was given and myocardial temperature dropped to 10 degrees centigrade. Complete diastolic arrest was maintained. The aorta was transected just proximal to the bovine trunk and the tear was located below the bovine trunk along the posterior wall of the aorta. The area of the tear was resected beveling the resection out along the undersurface of the aortic arch (Hemiarch replacement). The aortic diameter was measured at 30 mm here. A 32 x 10 mm Hemasheild Platinum vascular graft was prepared. ( REF # [id number] P0, Lot [id number], SN [id number]). It was anastomosed to the aortic arch in an end to end manner using 3-0 prolene continuous suture with a felt strip to reinforce the anastomisis. A light coating of Prevaleak was applied to seal needle holes. Antegrade cerebral perfusion was stopped and the clamp removed from the bovine trunk.  Circulation was slowly resumed. The aortic graft was cross-clamped proximal to the anastomosis and full CPB support was resumed. Circulatory arrest time was 33 minutes. Antegrade cerebral perfusion time was 32 minutes.  The dissected ascending aorta was separated from the right pulmonary artery. The dissection extended proximally down into non-coronary sinus with no other tears present in the ascending aorta or root. The valve looked normal. Coronary ostia  looked fine and the dissection did not involve either. The false lumen was very thin proximally and looked like it was a contained rupture that was leaking responsible for the fresh blood in the pericardium and blood welling up as we were cannulating and cooling.  The aorta was transected 1 cm above the commissures. The non-coronary sinus was reconstructed with a piece of felt between the layers and Bioglue to seal the layers together.   The graft was then cut to the appropriate length and anastomosed end to end to the supra-coronary aorta using continuous 3-0 prolene suture with a felt strip. Prevaleak was applied to seal the needle holes in the grafts. A vent cannula was placed into the graft to remove any air. Deairing maneuvers were performed and the bed placed in trendelenburg position.   Completion:   The patient was rewarmed to 37 degrees Centigrade. A dose of warm retrograde reanimation cardioplegia was given. The crossclamp was removed with a time of 75 minutes. There was spontaneous return of sinus rhythm. The position of the graft was satisfactory. The vascular anastomoses all appeared hemostatic. Two temporary epicardial pacing wires were placed on the right atrium and two on the right ventricle. The patient was weaned from CPB without difficulty on no inotropic agents.  CPB time was 111 minutes. Cardiac output was 5 LPM. TEE showed a normal functioning aortic valve with no AI.  LV function appeared normal. Heparin  was fully reversed with protamine  and the venous cannula removed. The axillary graft was ligated with a #1 silk tie and a 3-0 Prolene pledgetted horizontal mattress suture. Hemostasis was achieved.  She was given 2 units FFP and 1 unit Cryo for coagulopathy with low fibrinogen . Mediastinal drainage tubes were placed. The sternum was closed with  #6 stainless steel wires. The fascia was closed with continuous # 1 vicryl suture. The subcutaneous tissue was closed with 2-0 vicryl continuous  suture. The skin was closed with 3-0 vicryl subcuticular suture. The axillary incision was closed in layers in a similar manner. All sponge, needle, and instrument counts were reported correct at the end of the case. Dry sterile dressings were placed over the incisions and around the chest tubes which were connected to pleurevac suction. The patient was then transported to the surgical intensive care unit in stable condition.

## 2024-06-19 NOTE — ED Notes (Signed)
 Pt taken to xray

## 2024-06-19 NOTE — Anesthesia Procedure Notes (Signed)
 Central Venous Catheter Insertion Performed by: Epifanio Fallow, MD, anesthesiologist Start/End9/15/2025 6:55 AM, 06/19/2024 7:15 AM Patient location: Pre-op. Preanesthetic checklist: patient identified, IV checked, site marked, risks and benefits discussed, surgical consent, monitors and equipment checked, pre-op evaluation, timeout performed and anesthesia consent Hand hygiene performed  and maximum sterile barriers used  PA cath was placed.Swan type:thermodilution PA Cath depth:50 Procedure performed without using ultrasound guided technique. Attempts: 1 Patient tolerated the procedure well with no immediate complications.

## 2024-06-19 NOTE — Anesthesia Procedure Notes (Signed)
 Central Venous Catheter Insertion Performed by: Epifanio Fallow, MD, anesthesiologist Start/End9/15/2025 6:55 AM, 06/19/2024 7:15 AM Patient location: Pre-op. Preanesthetic checklist: patient identified, IV checked, site marked, risks and benefits discussed, surgical consent, monitors and equipment checked, pre-op evaluation, timeout performed and anesthesia consent Position: Trendelenburg Lidocaine  1% used for infiltration and patient sedated Hand hygiene performed , maximum sterile barriers used  and Seldinger technique used Catheter size: 9 Fr Total catheter length 10. Central line was placed.MAC introducer Procedure performed using ultrasound guided technique. Ultrasound Notes:anatomy identified, needle tip was noted to be adjacent to the nerve/plexus identified, no ultrasound evidence of intravascular and/or intraneural injection and image(s) printed for medical record Attempts: 1 Following insertion, line sutured, dressing applied and Biopatch. Post procedure assessment: blood return through all ports, free fluid flow and no air  Patient tolerated the procedure well with no immediate complications.

## 2024-06-19 NOTE — Transfer of Care (Signed)
 Immediate Anesthesia Transfer of Care Note  Patient: Sydney [name]  Procedure(s) Performed: REPAIR, ANEURYSM, AORTA, THORACIC, ASCENDING USING HEMASHIELD PLATINUM VASCULAR GRAFT (Chest)  Patient Location: SICU  Anesthesia Type:General  Level of Consciousness: sedated and Patient remains intubated per anesthesia plan  Airway & Oxygen Therapy: Patient remains intubated per anesthesia plan and Patient placed on Ventilator (see vital sign flow sheet for setting)  Post-op Assessment: Report given to RN and Post -op Vital signs reviewed and stable  Post vital signs: Reviewed and stable  Last Vitals:  Vitals Value Taken Time  BP 96/60   Temp 98.6   Pulse 65   Resp 24   SpO2 96     Last Pain:  Vitals:   06/19/24 0554  PainSc: 10-Worst pain ever      Patients Stated Pain Goal: 0 (06/19/24 0233)  Complications: No notable events documented.

## 2024-06-19 NOTE — Anesthesia Postprocedure Evaluation (Signed)
 Anesthesia Post Note  Patient: Sydney [name]  Procedure(s) Performed: REPAIR, ANEURYSM, AORTA, THORACIC, ASCENDING USING HEMASHIELD PLATINUM VASCULAR GRAFT (Chest)     Patient location during evaluation: SICU Anesthesia Type: General Level of consciousness: sedated Pain management: pain level controlled Vital Signs Assessment: post-procedure vital signs reviewed and stable Respiratory status: patient remains intubated per anesthesia plan Cardiovascular status: stable Postop Assessment: no apparent nausea or vomiting Anesthetic complications: no   No notable events documented.  Last Vitals:  Vitals:   06/19/24 1330 06/19/24 1345  BP:    Pulse: 70 69  Resp: 16 16  Temp: (!) 34.5 C (!) 34.6 C  SpO2: 98% 98%    Last Pain:  Vitals:   06/19/24 0554  PainSc: 10-Worst pain ever    LLE Motor Response: Purposeful movement;Responds to commands (06/19/24 1430)   RLE Motor Response: Purposeful movement;Responds to commands (06/19/24 1430)        Dayshawn Irizarry,W. EDMOND

## 2024-06-19 NOTE — Anesthesia Procedure Notes (Addendum)
 Procedure Name: Intubation Date/Time: 06/19/2024 7:24 AM  Performed by: Harrold Macintosh, CRNAPre-anesthesia Checklist: Patient identified, Emergency Drugs available, Suction available and Patient being monitored Patient Re-evaluated:Patient Re-evaluated prior to induction Oxygen Delivery Method: Circle system utilized Preoxygenation: Pre-oxygenation with 100% oxygen Induction Type: IV induction, Rapid sequence and Cricoid Pressure applied Laryngoscope Size: Glidescope and 3 Grade View: Grade I Tube type: Oral Tube size: 8.0 mm Number of attempts: 1 Airway Equipment and Method: Rigid stylet and Video-laryngoscopy Placement Confirmation: ETT inserted through vocal cords under direct vision, positive ETCO2 and breath sounds checked- equal and bilateral Secured at: 22 cm Tube secured with: Tape Dental Injury: Teeth and Oropharynx as per pre-operative assessment  Comments: RSI for NPO status

## 2024-06-19 NOTE — Anesthesia Preprocedure Evaluation (Signed)
 Anesthesia Evaluation  Patient identified by MRN, date of birth, ID band Patient awake    Reviewed: Allergy & Precautions, H&P , NPO status , Patient's Chart, lab work & pertinent test results  Airway Mallampati: II  TM Distance: >3 FB Neck ROM: Full    Dental no notable dental hx. (+) Teeth Intact, Dental Advisory Given   Pulmonary Current Smoker   Pulmonary exam normal breath sounds clear to auscultation       Cardiovascular hypertension, Pt. on medications  Rhythm:Regular Rate:Normal     Neuro/Psych   Anxiety Depression    negative neurological ROS     GI/Hepatic negative GI ROS, Neg liver ROS,,,  Endo/Other  negative endocrine ROS    Renal/GU negative Renal ROS  negative genitourinary   Musculoskeletal  (+) Arthritis , Osteoarthritis,    Abdominal   Peds  Hematology  (+) Blood dyscrasia, anemia   Anesthesia Other Findings   Reproductive/Obstetrics negative OB ROS                              Anesthesia Physical Anesthesia Plan  ASA: 5 and emergent  Anesthesia Plan: General   Post-op Pain Management:    Induction: Intravenous  PONV Risk Score and Plan: 2 and Treatment may vary due to age or medical condition  Airway Management Planned: Oral ETT  Additional Equipment: Arterial line, CVP, PA Cath, TEE and Ultrasound Guidance Line Placement  Intra-op Plan:   Post-operative Plan: Post-operative intubation/ventilation  Informed Consent: I have reviewed the patients History and Physical, chart, labs and discussed the procedure including the risks, benefits and alternatives for the proposed anesthesia with the patient or authorized representative who has indicated his/her understanding and acceptance.     Dental advisory given  Plan Discussed with: CRNA  Anesthesia Plan Comments:         Anesthesia Quick Evaluation

## 2024-06-19 NOTE — Brief Op Note (Deleted)
 06/19/2024  12:47 PM  PATIENT:  Sydney [name]  52 y.o. female  PRE-OPERATIVE DIAGNOSIS:  AORTIC DISSECTION  POST-OPERATIVE DIAGNOSIS:  AORTIC DISSECTION  PROCEDURE:  Procedure(s): REPAIR, ANEURYSM, AORTA, THORACIC, ASCENDING USING HEMASHIELD PLATINUM VASCULAR GRAFT (N/A)  SURGEON:  Surgeons and Role:    * Lucas Dorise POUR, MD - Primary  PHYSICIAN ASSISTANT: Con Bend PA-C  ASSISTANTS: none   ANESTHESIA:   general  EBL:  940cc  BLOOD ADMINISTERED: Per perfusion records  DRAINS: Mediastinal and pleural drains   LOCAL MEDICATIONS USED:  NONE  SPECIMEN:  Source of Specimen:  Aortic aneurysm  DISPOSITION OF SPECIMEN:  PATHOLOGY  COUNTS:  YES  TOURNIQUET:  * No tourniquets in log *  DICTATION: .Dragon Dictation  PLAN OF CARE: Admit to inpatient   PATIENT DISPOSITION:  ICU - intubated and hemodynamically stable.   Delay start of Pharmacological VTE agent (>24hrs) due to surgical blood loss or risk of bleeding: yes

## 2024-06-19 NOTE — ED Triage Notes (Signed)
 Pt states that she had sudden onset of sharp right sided chest pain approx 1 hour ago while she was relaxing. Pt states that she has nausea, as well.

## 2024-06-19 NOTE — ED Provider Notes (Signed)
 [facility] Emergency Department Provider Note MRN:  [id number]  Arrival date & time: 06/19/24     Chief Complaint   Chest Pain   History of Present Illness   Sydney [name] is a 53 y.o. year-old female presents to the ED with chief complaint of chest pain.  She states that the pain started suddenly at around 2 AM.  She states that it is in the center of her chest and is sharp and stabbing.  She denies any pain in her back.  She denies any recent injuries.  Denies any burning sensation in her throat.  She denies any history of heart problems.  Denies any history of PE or DVT.  Denies any recent illnesses.  Denies cough or fever.  History provided by patient.   Review of Systems  Pertinent positive and negative review of systems noted in HPI.    Physical Exam   Vitals:   06/19/24 0545 06/19/24 0615  BP: (!) 117/90 120/89  Pulse: (!) 54 (!) 57  Resp: 13 20  Temp:    SpO2: 98% 99%    CONSTITUTIONAL:  uncomfortable-appearing, NAD NEURO:  Alert and oriented x 3, CN 3-12 grossly intact EYES:  eyes equal and reactive ENT/NECK:  Supple, no stridor  CARDIO:  normal rate, regular rhythm, appears well-perfused, intact distal pulses  PULM:  No respiratory distress, CTAB GI/GU:  non-distended,  MSK/SPINE:  No gross deformities, no edema, moves all extremities, no leg swelling SKIN:  no rash, atraumatic   *Additional and/or pertinent findings included in MDM below  Diagnostic and Interventional Summary    EKG Interpretation Date/Time:  Monday June 19 2024 02:27:04 EDT Ventricular Rate:  60 PR Interval:  174 QRS Duration:  80 QT Interval:  410 QTC Calculation: 410 R Axis:   78  Text Interpretation: Normal sinus rhythm Minimal voltage criteria for LVH, may be normal variant ( Sokolow-Lyon ) T wave abnormality, consider lateral ischemia Abnormal ECG When compared with ECG of 26-Jun-2020 03:20, No acute changes Confirmed by Haze Lonni PARAS  [phone]) on 06/19/2024 5:06:29 AM       Labs Reviewed  BASIC METABOLIC PANEL WITH GFR - Abnormal; Notable for the following components:      Result Value   Glucose, Bld 119 (*)    Creatinine, Ser 1.19 (*)    GFR, Estimated 55 (*)    All other components within normal limits  CBC - Abnormal; Notable for the following components:   Hemoglobin 11.6 (*)    All other components within normal limits  RESP PANEL BY RT-PCR (RSV, FLU A&B, COVID)  RVPGX2  TROPONIN I (HIGH SENSITIVITY)  TROPONIN I (HIGH SENSITIVITY)    CT Angio Chest/Abd/Pel for Dissection W and/or Wo Contrast  Final Result    DG Chest 2 View  Final Result      Medications  fentaNYL  (SUBLIMAZE ) injection 50 mcg (has no administration in time range)  fentaNYL  (SUBLIMAZE ) injection 50 mcg (50 mcg Intravenous Given 06/19/24 0554)  dexmedetomidine  (PRECEDEX ) 400 MCG/100ML (4 mcg/mL) infusion (has no administration in time range)  insulin  regular, human (MYXREDLIN ) 100 units/ 100 mL infusion (has no administration in time range)  EPINEPHrine  (ADRENALIN ) 5 mg in NS 250 mL (0.02 mg/mL) premix infusion (has no administration in time range)  milrinone  (PRIMACOR ) 20 MG/100 ML (0.2 mg/mL) infusion (has no administration in time range)  nitroGLYCERIN  50 mg in dextrose  5 % 250 mL (0.2 mg/mL) infusion (has no administration in time range)  norepinephrine  (LEVOPHED )  4mg  in (0.016 mg/mL) premix infusion (has no administration in time range)  phenylephrine  (NEO-SYNEPHRINE) 20mg /NS premix infusion (has no administration in time range)  magnesium  sulfate (IV Push/IM) injection 40 mEq (has no administration in time range)  potassium chloride  injection 80 mEq (has no administration in time range)  heparin  30,000 units/NS 1000 mL solution for CELLSAVER (has no administration in time range)  heparin  sodium (porcine) 2,500 Units, papaverine 30 mg in electrolyte-A (PLASMALYTE-A PH 7.4) 500 mL irrigation (has no administration in time  range)  tranexamic acid  (CYKLOKAPRON ) pump prime solution 2 mg/kg (has no administration in time range)  tranexamic acid  (CYKLOKAPRON ) bolus via infusion - over 30 minutes 15 mg/kg (has no administration in time range)  tranexamic acid  (CYKLOKAPRON ) 2,500 mg in sodium chloride  0.9 % 250 mL (10 mg/mL) infusion (has no administration in time range)  ceFAZolin  (ANCEF ) IVPB 2g/100 mL premix (has no administration in time range)  [facility] Blood Cardioplegia vial (lidocaine /magnesium /mannitol  0.26g-4g-6.4g) (has no administration in time range)  [facility] Blood Cardioplegia vial (lidocaine /magnesium /mannitol  0.26g-4g-6.4g) (has no administration in time range)  ondansetron  (ZOFRAN -ODT) disintegrating tablet 4 mg (4 mg Oral Given 06/19/24 0244)  acetaminophen  (TYLENOL ) tablet 650 mg (650 mg Oral Given 06/19/24 0245)  morphine  (PF) 4 MG/ML injection 4 mg (4 mg Intravenous Given 06/19/24 0515)  alum & mag hydroxide-simeth (MAALOX/MYLANTA) 200-200-20 MG/5ML suspension 30 mL (30 mLs Oral Given 06/19/24 0517)  iohexol  (OMNIPAQUE ) 350 MG/ML injection 75 mL (75 mLs Intravenous Contrast Given 06/19/24 0538)     Procedures  /  Critical Care .Critical Care  Performed by: Vicky Charleston, PA-C Authorized by: Vicky Charleston, PA-C   Critical care provider statement:    Critical care time (minutes):  51   Critical care was necessary to treat or prevent imminent or life-threatening deterioration of the following conditions:  Circulatory failure   Critical care was time spent personally by me on the following activities:  Development of treatment plan with patient or surrogate, discussions with consultants, evaluation of patient's response to treatment, examination of patient, ordering and review of laboratory studies, ordering and review of radiographic studies, ordering and performing treatments and interventions, pulse oximetry, re-evaluation of patient's condition and review of old charts   ED Course and  Medical Decision Making  I have reviewed the triage vital signs, the nursing notes, and pertinent available records from the EMR.  Social Determinants Affecting Complexity of Care: Patient has no clinically significant social determinants affecting this chief complaint..   ED Course: Clinical Course as of 06/19/24 0634  [facility] Jun 19, 2024  9445 I consulted cardiothoracic surgery at [phone] and received a phone call back at 0548.  I spoke with Dr. Kerrin, who is appreciated for consulting. [RB]  0559 Basic metabolic panel(!) [RB]  0559 CBC(!) [RB]  0602 Blood pressure was slightly high in triage, but systolic has been trending between 100-120.  Will continue to monitor. [RB]    Clinical Course User Index [RB] Vicky Charleston, PA-C    Medical Decision Making Patient here with sudden onset chest pain that started around 2 AM.  She states that it is sharp and stabbing.  She denies ever having had something like this before.  She denies history of PE or DVT.  Patient looks very uncomfortable, but all of labs are reassuring thus far.  She is tall and thin, but not markedly hypertensive.  Will check CT dissection.  CT positive for type a dissection.  Amount and/or Complexity of Data Reviewed Labs: ordered.  Decision-making details documented in ED Course. Radiology: ordered and independent interpretation performed.    Details: Type A dissection  Risk OTC drugs. Prescription drug management. Decision regarding hospitalization.         Consultants: I consulted with Dr. Kerrin, who is appreciated for consulting/admitting.   Treatment and Plan: Patient's exam and diagnostic results are concerning for type A dissection.  Feel that patient will need admission to the hospital for further treatment and evaluation.  Patient seen by and discussed with attending physician, Dr. Haze, who agrees with plan.  Final Clinical Impressions(s) / ED Diagnoses     ICD-10-CM   1.  Dissection of ascending aorta (HCC)  I71.010       ED Discharge Orders     None         Discharge Instructions Discussed with and Provided to Patient:   Discharge Instructions   None      Vicky Charleston, PA-C 06/19/24 [id number]    Haze Lonni PARAS, MD 06/19/24 [phone]

## 2024-06-19 NOTE — Hospital Course (Addendum)
 HPI:  Sydney [name] is a 53 yo woman with past medical history of hypertension, GIST tumor of stomach, arthritis, chronic pain, tobacco use, thyroid  goiter, and blind left eye. She presented to [facility] ED with severe CP that started around 2 AM. 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 She continues to have pain.  Dr. Lucas and Dr. Kerrin reviewed the patient's diagnostic studies and determined she would benefit from surgical intervention. They reviewed the patient's treatment options as well as the risks and benefits of surgery with the patient. Sydney [name] was agreeable to proceed with emergency repair surgery.   Hospital Course:  Sydney [name] was emergently brought to the operating room and underwent ascending aorta repair utilizing a 32mm hemashield platinum graft. She tolerated the procedure well and was transferred to the SICU in stable condition. She was extubated the evening of surgery without complication. She required Cleviprex  for hypertension, this was weaned as tolerated and Lopressor  was titrated. Swan ganz catheter and arterial line were removed on POD1 without complication. She was routinely diuresed and returned to her baseline weight. Epicardial pacing wires and chest tubes were removed without complication. She was felt stable for transfer to the progressive unit. She remained hypertensive and Norvasc  was started. She was felt stable for transfer to the progressive unit on 09/17 but was unable to get a bed until 9/18. ASA was not started due to history of GI bleed on ASA.  Renal function has remained within normal limits.  She does have an expected acute blood loss anemia which has stabilized.  She began moving her bowels appropriately. She was ambulating well on room air and her incisions were  healing well without sign of infection. She was felt stable for discharge home.
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
## 2024-06-19 NOTE — Progress Notes (Signed)
 Patient ID: Sydney [name], female   DOB: November 24, 1970, 53 y.o.   MRN: [id number]  TCTS Evening Rounds:   Hemodynamically stable  CI = 1.7  Has started to wake up on vent. Weaning.  Urine output good  CT output low  CBC    Component Value Date/Time   WBC 11.4 (H) 06/19/2024 1309   RBC 3.19 (L) 06/19/2024 1309   HGB 9.5 (L) 06/19/2024 1311   HGB 10.6 (L) 03/31/2011 1517   HCT 28.0 (L) 06/19/2024 1311   HCT 33.5 (L) 03/31/2011 1517   PLT 106 (L) 06/19/2024 1309   PLT 214 03/31/2011 1517   MCV 89.7 06/19/2024 1309   MCV 77.2 (L) 03/31/2011 1517   MCH 28.5 06/19/2024 1309   MCHC 31.8 06/19/2024 1309   RDW 13.2 06/19/2024 1309   RDW 16.3 (H) 03/31/2011 1517   LYMPHSABS 2.1 12/01/2021 0217   LYMPHSABS 1.9 03/31/2011 1517   MONOABS 0.5 12/01/2021 0217   MONOABS 0.3 03/31/2011 1517   EOSABS 0.2 12/01/2021 0217   EOSABS 0.1 03/31/2011 1517   BASOSABS 0.0 12/01/2021 0217   BASOSABS 0.0 03/31/2011 1517     BMET    Component Value Date/Time   NA 139 06/19/2024 1311   K 3.5 06/19/2024 1311   CL 101 06/19/2024 1124   CO2 24 06/19/2024 0241   GLUCOSE 145 (H) 06/19/2024 1124   BUN 15 06/19/2024 1124   CREATININE 0.80 06/19/2024 1124   CREATININE 0.79 10/31/2014 1440   CALCIUM 8.9 06/19/2024 0241   GFRNONAA 55 (L) 06/19/2024 0241     A/P:  Stable postop course. Continue current plans. Wean to extubate.

## 2024-06-19 NOTE — Anesthesia Procedure Notes (Signed)
 Arterial Line Insertion Start/End9/15/2025 7:00 AM, 06/19/2024 7:04 AM Performed by: Richarda Silvano HERO, CRNA, CRNA  Patient location: OR. Preanesthetic checklist: patient identified, IV checked, site marked, risks and benefits discussed, surgical consent, monitors and equipment checked, pre-op evaluation, timeout performed and anesthesia consent Lidocaine  1% used for infiltration Left, radial was placed Catheter size: 20 G Hand hygiene performed  and maximum sterile barriers used   Attempts: 1 Procedure performed without using ultrasound guided technique. Following insertion, dressing applied and Biopatch. Post procedure assessment: normal and unchanged  Patient tolerated the procedure well with no immediate complications.

## 2024-06-19 NOTE — ED Notes (Signed)
Cardiothoracic surgeon at the bedside.

## 2024-06-19 NOTE — Procedures (Signed)
 Extubation Procedure Note  Patient Details:   Name: Sydney [name] DOB: 1971/03/06 MRN: [id number]   Airway Documentation:    Vent end date: 06/19/24 Vent end time: 1755   Evaluation  O2 sats: stable throughout Complications: No apparent complications Patient did tolerate procedure well. Bilateral Breath Sounds: Clear   Yes  Pt extubated per rapid wean protocol to 4L [place]. Pt passed all parameters with a NIF -20 and VC 0.85L. Pt had a positive cuff leak, able to state name, good cough and no stridor noted.   Shan LITTIE Collum 06/19/2024, 6:13 PM

## 2024-06-19 NOTE — Anesthesia Procedure Notes (Addendum)
 Arterial Line Insertion Start/End9/15/2025 7:05 AM, 06/19/2024 7:09 AM Performed by: Emmitt Millman, CRNA, CRNA  Patient location: Pre-op. Preanesthetic checklist: patient identified, IV checked, site marked, risks and benefits discussed, surgical consent, monitors and equipment checked, pre-op evaluation, timeout performed and anesthesia consent Patient sedated Right, radial was placed Catheter size: 20 G Hand hygiene performed  and maximum sterile barriers used   Attempts: 2 Procedure performed without using ultrasound guided technique. Following insertion, dressing applied and Biopatch. Post procedure assessment: normal and unchanged  Patient tolerated the procedure well with no immediate complications.

## 2024-06-19 NOTE — Discharge Instructions (Signed)

## 2024-06-19 NOTE — Brief Op Note (Signed)
 06/19/2024  12:59 PM  PATIENT:  Sydney [name]  53 y.o. female  PRE-OPERATIVE DIAGNOSIS:  AORTIC DISSECTION  POST-OPERATIVE DIAGNOSIS:  AORTIC DISSECTION  PROCEDURE:  Procedure(s): REPAIR, ANEURYSM, AORTA, THORACIC, ASCENDING USING HEMASHIELD PLATINUM VASCULAR GRAFT (N/A)  SURGEON:  Surgeons and Role:    * Lucas Dorise POUR, MD - Primary  PHYSICIAN ASSISTANT: Con Bend, PA-C  ASSISTANTS: none   ANESTHESIA:   general  EBL:  940 mL    DRAINS: two mediastinal Chest Tube(s)   LOCAL MEDICATIONS USED:  NONE  SPECIMEN:  Source of Specimen:  aortic aneurysm  DISPOSITION OF SPECIMEN:  PATHOLOGY  COUNTS:  YES  TOURNIQUET:  * No tourniquets in log *  DICTATION: .Note written in EPIC  PLAN OF CARE: Admit to inpatient   PATIENT DISPOSITION:  ICU - intubated and hemodynamically stable.   Delay start of Pharmacological VTE agent (>24hrs) due to surgical blood loss or risk of bleeding: yes

## 2024-06-20 ENCOUNTER — Encounter (HOSPITAL_COMMUNITY): Payer: Self-pay | Admitting: Surgery

## 2024-06-20 ENCOUNTER — Inpatient Hospital Stay (HOSPITAL_COMMUNITY)

## 2024-06-20 DIAGNOSIS — Z8679 Personal history of other diseases of the circulatory system: Secondary | ICD-10-CM

## 2024-06-20 DIAGNOSIS — Z95828 Presence of other vascular implants and grafts: Secondary | ICD-10-CM

## 2024-06-20 LAB — BASIC METABOLIC PANEL WITH GFR
Anion gap: 5 (ref 5–15)
Anion gap: 11 (ref 5–15)
BUN: 15 mg/dL (ref 6–20)
BUN: 16 mg/dL (ref 6–20)
CO2: 20 mmol/L — ABNORMAL LOW (ref 22–32)
CO2: 22 mmol/L (ref 22–32)
Calcium: 7.4 mg/dL — ABNORMAL LOW (ref 8.9–10.3)
Calcium: 7.8 mg/dL — ABNORMAL LOW (ref 8.9–10.3)
Chloride: 109 mmol/L (ref 98–111)
Chloride: 102 mmol/L (ref 98–111)
Creatinine, Ser: 1.01 mg/dL — ABNORMAL HIGH (ref 0.44–1.00)
Creatinine, Ser: 1.16 mg/dL — ABNORMAL HIGH (ref 0.44–1.00)
GFR, Estimated: >60 mL/min (ref >60)
GFR, Estimated: 57 mL/min — ABNORMAL LOW (ref >60)
Glucose, Bld: 120 mg/dL — ABNORMAL HIGH (ref 70–99)
Glucose, Bld: 135 mg/dL — ABNORMAL HIGH (ref 70–99)
Potassium: 4.2 mmol/L (ref 3.5–5.1)
Potassium: 4.5 mmol/L (ref 3.5–5.1)
Sodium: 134 mmol/L — ABNORMAL LOW (ref 135–145)
Sodium: 135 mmol/L (ref 135–145)

## 2024-06-20 LAB — BPAM CRYOPRECIPITATE
Blood Product Expiration Date: 202509172359
ISSUE DATE / TIME: 202509151103
Unit Type and Rh: 5100

## 2024-06-20 LAB — BPAM FFP
Blood Product Expiration Date: 202509172359
Blood Product Expiration Date: 202509172359
ISSUE DATE / TIME: 202509151104
ISSUE DATE / TIME: 202509151104
Unit Type and Rh: 6200
Unit Type and Rh: 6200

## 2024-06-20 LAB — PREPARE CRYOPRECIPITATE: Unit division: 0

## 2024-06-20 LAB — CBC
HCT: 29.8 % — ABNORMAL LOW (ref 36.0–46.0)
HCT: 31.8 % — ABNORMAL LOW (ref 36.0–46.0)
Hemoglobin: 9.6 g/dL — ABNORMAL LOW (ref 12.0–15.0)
Hemoglobin: 10.1 g/dL — ABNORMAL LOW (ref 12.0–15.0)
MCH: 28.7 pg (ref 26.0–34.0)
MCH: 28.9 pg (ref 26.0–34.0)
MCHC: 32.2 g/dL (ref 30.0–36.0)
MCHC: 31.8 g/dL (ref 30.0–36.0)
MCV: 89 fL (ref 80.0–100.0)
MCV: 90.9 fL (ref 80.0–100.0)
Platelets: 138 K/uL — ABNORMAL LOW (ref 150–400)
Platelets: 156 K/uL (ref 150–400)
RBC: 3.35 MIL/uL — ABNORMAL LOW (ref 3.87–5.11)
RBC: 3.5 MIL/uL — ABNORMAL LOW (ref 3.87–5.11)
RDW: 13.9 % (ref 11.5–15.5)
RDW: 14 % (ref 11.5–15.5)
WBC: 9.5 K/uL (ref 4.0–10.5)
WBC: 10.2 K/uL (ref 4.0–10.5)
nRBC: 0 % (ref 0.0–0.2)
nRBC: 0 % (ref 0.0–0.2)

## 2024-06-20 LAB — PREPARE FRESH FROZEN PLASMA

## 2024-06-20 LAB — LIPID PANEL
Cholesterol: 81 mg/dL (ref 0–200)
HDL: 36 mg/dL — ABNORMAL LOW (ref >40)
LDL Cholesterol: 39 mg/dL (ref 0–99)
Total CHOL/HDL Ratio: 2.3 ratio
Triglycerides: 29 mg/dL (ref <150)
VLDL: 6 mg/dL (ref 0–40)

## 2024-06-20 LAB — SURGICAL PATHOLOGY

## 2024-06-20 LAB — GLUCOSE, CAPILLARY
Glucose-Capillary: 118 mg/dL — ABNORMAL HIGH (ref 70–99)
Glucose-Capillary: 119 mg/dL — ABNORMAL HIGH (ref 70–99)
Glucose-Capillary: 131 mg/dL — ABNORMAL HIGH (ref 70–99)
Glucose-Capillary: 153 mg/dL — ABNORMAL HIGH (ref 70–99)
Glucose-Capillary: 167 mg/dL — ABNORMAL HIGH (ref 70–99)
Glucose-Capillary: 169 mg/dL — ABNORMAL HIGH (ref 70–99)
Glucose-Capillary: 122 mg/dL — ABNORMAL HIGH (ref 70–99)
Glucose-Capillary: 124 mg/dL — ABNORMAL HIGH (ref 70–99)
Glucose-Capillary: 124 mg/dL — ABNORMAL HIGH (ref 70–99)
Glucose-Capillary: 104 mg/dL — ABNORMAL HIGH (ref 70–99)
Glucose-Capillary: 136 mg/dL — ABNORMAL HIGH (ref 70–99)
Glucose-Capillary: 134 mg/dL — ABNORMAL HIGH (ref 70–99)
Glucose-Capillary: 132 mg/dL — ABNORMAL HIGH (ref 70–99)

## 2024-06-20 LAB — HEMOGLOBIN A1C
Hgb A1c MFr Bld: 4.3 % — ABNORMAL LOW (ref 4.8–5.6)
Mean Plasma Glucose: 76.71 mg/dL

## 2024-06-20 LAB — MAGNESIUM
Magnesium: 3.4 mg/dL — ABNORMAL HIGH (ref 1.7–2.4)
Magnesium: 3.2 mg/dL — ABNORMAL HIGH (ref 1.7–2.4)

## 2024-06-20 MED ORDER — INSULIN ASPART 100 UNIT/ML IJ SOLN
0.0000 [IU] | INTRAMUSCULAR | Status: DC
  Administered 2024-06-20 – 2024-06-21 (×4): 2 [IU] via SUBCUTANEOUS

## 2024-06-20 MED ORDER — METOPROLOL TARTRATE 50 MG PO TABS
50.0000 mg | ORAL_TABLET | Freq: Two times a day (BID) | ORAL | Status: DC
  Administered 2024-06-20 – 2024-06-24 (×9): 50 mg via ORAL
  Filled 2024-06-20 (×9): qty 1

## 2024-06-20 MED ORDER — FUROSEMIDE 10 MG/ML IJ SOLN
40.0000 mg | Freq: Two times a day (BID) | INTRAMUSCULAR | Status: AC
  Administered 2024-06-20 (×2): 40 mg via INTRAVENOUS
  Filled 2024-06-20 (×2): qty 4

## 2024-06-20 MED ORDER — INSULIN ASPART 100 UNIT/ML IJ SOLN: 0.0000 [IU] | INTRAMUSCULAR | Status: DC

## 2024-06-20 MED ORDER — METOPROLOL TARTRATE 5 MG/5ML IV SOLN
2.5000 mg | INTRAVENOUS | Status: DC | PRN
  Administered 2024-06-20: 2.5 mg via INTRAVENOUS
  Filled 2024-06-20: qty 5

## 2024-06-20 MED ORDER — ENOXAPARIN SODIUM 40 MG/0.4ML IJ SOSY
40.0000 mg | PREFILLED_SYRINGE | Freq: Every day | INTRAMUSCULAR | Status: DC
  Administered 2024-06-20 – 2024-06-23 (×4): 40 mg via SUBCUTANEOUS
  Filled 2024-06-20 (×4): qty 0.4

## 2024-06-20 MED ORDER — POTASSIUM CHLORIDE CRYS ER 20 MEQ PO TBCR
20.0000 meq | EXTENDED_RELEASE_TABLET | Freq: Two times a day (BID) | ORAL | Status: AC
  Administered 2024-06-20 (×2): 20 meq via ORAL
  Filled 2024-06-20 (×2): qty 1

## 2024-06-20 MED FILL — Heparin Sodium (Porcine) Inj 1000 Unit/ML: Qty: 1000 | Status: AC

## 2024-06-20 MED FILL — Lidocaine HCl Local Preservative Free (PF) Inj 2%: INTRAMUSCULAR | Qty: 14 | Status: AC

## 2024-06-20 MED FILL — Thrombin (Recombinant) For Soln 20000 Unit: CUTANEOUS | Qty: 1 | Status: AC

## 2024-06-20 MED FILL — Mannitol IV Soln 20%: INTRAVENOUS | Qty: 500 | Status: AC

## 2024-06-20 MED FILL — Sodium Chloride IV Soln 0.9%: INTRAVENOUS | Qty: 2000 | Status: AC

## 2024-06-20 MED FILL — Electrolyte-R (PH 7.4) Solution: INTRAVENOUS | Qty: 3000 | Status: AC

## 2024-06-20 MED FILL — Potassium Chloride Inj 2 mEq/ML: INTRAVENOUS | Qty: 40 | Status: AC

## 2024-06-20 MED FILL — Heparin Sodium (Porcine) Inj 1000 Unit/ML: INTRAMUSCULAR | Qty: 20 | Status: AC

## 2024-06-20 MED FILL — Sodium Bicarbonate IV Soln 8.4%: INTRAVENOUS | Qty: 50 | Status: AC

## 2024-06-20 NOTE — Plan of Care (Signed)
  Problem: Clinical Measurements: Goal: Respiratory complications will improve Outcome: Progressing   Problem: Clinical Measurements: Goal: Cardiovascular complication will be avoided Outcome: Progressing   Problem: Clinical Measurements: Goal: Diagnostic test results will improve Outcome: Progressing   Problem: Pain Managment: Goal: General experience of comfort will improve and/or be controlled Outcome: Progressing

## 2024-06-20 NOTE — Progress Notes (Signed)
   [address], [place] [address]             [phone]   POD # 1  Up in chair, sleeping  BP (!) 126/98 (BP Location: Left Arm)   Pulse 84   Temp (!) 97 F (36.1 C) (Axillary)   Resp 19   Ht 5' 9 (1.753 m)   Wt 75.2 kg   LMP 04/26/2016   SpO2 97%   BMI 24.48 kg/m   RA 97% sat   Intake/Output Summary (Last 24 hours) at 06/20/2024 1750 Last data filed at 06/20/2024 1600 Gross per 24 hour  Intake 2110.63 ml  Output 2073 ml  Net 37.63 ml   Creatinine 1.16, K 4.5 Hct 32  Doing well POD # 1  Jon Kasparek C. Kerrin, MD [facility] [phone]

## 2024-06-20 NOTE — Progress Notes (Signed)
 1 Day Post-Op Procedure(s) (LRB): REPAIR, ANEURYSM, AORTA, THORACIC, ASCENDING USING HEMASHIELD PLATINUM VASCULAR GRAFT (N/A) Subjective: Stable overnight. Sleepy most likely from pain meds.  Objective: Vital signs in last 24 hours: Temp:  [93.9 F (34.4 C)-99.1 F (37.3 C)] 98.8 F (37.1 C) (09/16 0600) Pulse Rate:  [62-97] 78 (09/16 0545) Cardiac Rhythm: Normal sinus rhythm (09/16 0028) Resp:  [11-31] 17 (09/16 0600) BP: (87-125)/(68-98) 93/79 (09/16 0600) SpO2:  [86 %-100 %] 86 % (09/16 0545) Arterial Line BP: (82-127)/(48-84) 106/68 (09/16 0600) FiO2 (%):  [40 %-50 %] 40 % (09/15 1723) Weight:  [75.2 kg] 75.2 kg (09/16 0500)  Hemodynamic parameters for last 24 hours: PAP: (15-30)/(4-18) 23/14 CVP:  [2 mmHg-16 mmHg] 11 mmHg CO:  [2.9 L/min-3.7 L/min] 3.7 L/min CI:  [1.6 L/min/m2-1.98 L/min/m2] 1.98 L/min/m2  Intake/Output from previous day: 09/15 0701 - 09/16 0700 In: 6858.9 [I.V.:3923.4; Blood:1255; IV Piggyback:1680.5] Out: 2940 [Urine:1500; Blood:940; Chest Tube:500] Intake/Output this shift: Total I/O In: 1135.5 [I.V.:491.1; IV Piggyback:644.4] Out: 610 [Urine:330; Chest Tube:280]  General appearance: sleepy but awakens and follows commands Neurologic: intact Heart: regular rate and rhythm Lungs: clear to auscultation bilaterally Extremities: edema mild Wound: dressing dry  Lab Results: Recent Labs    06/19/24 1952 06/20/24 0449  WBC 10.0 9.5  HGB 9.2* 9.6*  HCT 28.3* 29.8*  PLT 119* 138*   BMET:  Recent Labs    06/19/24 1952 06/20/24 0449  NA 135 134*  K 3.9 4.2  CL 107 109  CO2 22 20*  GLUCOSE 138* 120*  BUN 14 15  CREATININE 0.85 1.01*  CALCIUM 7.6* 7.4*    PT/INR:  Recent Labs    06/19/24 1309  LABPROT 16.8*  INR 1.3*   ABG    Component Value Date/Time   PHART 7.360 06/19/2024 1856   HCO3 21.0 06/19/2024 1856   TCO2 22 06/19/2024 1856   ACIDBASEDEF 4.0 (H) 06/19/2024 1856   O2SAT 99 06/19/2024 1856   CBG (last 3)   Recent Labs    06/20/24 0122 06/20/24 0238 06/20/24 0448  GLUCAP 118* 119* 122*   CXR: mild right lung atelectasis, may be small pleural effusion.  Assessment/Plan: S/P Procedure(s) (LRB): REPAIR, ANEURYSM, AORTA, THORACIC, ASCENDING USING HEMASHIELD PLATINUM VASCULAR GRAFT (N/A)  POD 1 Hemodynamically stable in sinus rhythm. Continue Lopressor . Wean off Cleviprex . Titrate Lopressor  up as needed.  Start diuresis and KCL  Keep chest tubes in today.  Insulin  drip to SSI. Preop Hgb A1c 4.6.  DC arterial lines and swan.  OOB, IS, mobilize   LOS: 1 day    Dorise MARLA Fellers 06/20/2024

## 2024-06-20 NOTE — TOC Initial Note (Signed)
 Transition of Care [facility]) - Initial/Assessment Note    Patient Details  Name: Sydney [name] MRN: [id number] Date of Birth: 1971-04-24  [facility]) CM/SW Contact:    Justina Delcia Czar, RN Phone Number: [phone] 06/20/2024, 5:04 PM  Clinical Narrative:                 Spoke to pt and she was independent pta. Pt works full-time and will check on FMLA/STD.   Will continue to follow up with dc needs.   Expected Discharge Plan: Home/Self Care Barriers to Discharge: Continued Medical Work up   Patient Goals and CMS Choice     Expected Discharge Plan and Services   Discharge Planning Services: CM Consult   Living arrangements for the past 2 months: Single Family Home                    Prior Living Arrangements/Services Living arrangements for the past 2 months: Single Family Home Lives with:: Self, Minor Children Patient language and need for interpreter reviewed:: Yes Do you feel safe going back to the place where you live?: Yes      Need for Family Participation in Patient Care: No (Comment) Care giver support system in place?: No (comment)   Criminal Activity/Legal Involvement Pertinent to Current Situation/Hospitalization: No - Comment as needed  Activities of Daily Living      Permission Sought/Granted Permission sought to share information with : Case Manager, Family Supports, PCP Permission granted to share information with : Yes, Verbal Permission Granted  Share Information with NAME: Dequarius Runk  Permission granted to share info w AGENCY: PCP  Permission granted to share info w Relationship: son  Permission granted to share info w Contact Information: [phone]  Emotional Assessment Appearance:: Appears stated age Attitude/Demeanor/Rapport: Engaged Affect (typically observed): Accepting Orientation: : Oriented to Self, Oriented to Place, Oriented to  Time, Oriented to Situation   Psych Involvement: No (comment)  Admission  diagnosis:  Dissection of ascending aorta (HCC) [I71.010] Aortic dissection (HCC) [I71.00] Patient Active Problem List   Diagnosis Date Noted   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 06/20/2024   S/P ascending aortic replacement 06/20/2024   Aortic dissection (HCC) 06/19/2024   Acute lower GI bleeding 12/01/2021   GI bleed 06/27/2020   Syncope 06/26/2020   Orthostatic hypotension    Vagina itching    Confusion    Lightheadedness    Rectal bleeding    Acute GI bleeding 08/24/2018   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)    Anemia, unspecified 05/14/2016   Thyroid  goiter 05/14/2016   Thyroid  nodule 05/14/2016   Constipation 05/14/2016   Wart 05/14/2016   Chronic fatigue 05/14/2016   S/P hysterectomy with oophorectomy 04/30/2016   Lumbar post-laminectomy syndrome 10/23/2015   Lumbar radicular pain 10/23/2015   Essential hypertension 07/12/2015   BPPV (benign paroxysmal positional vertigo) 07/12/2015   PCP:  Tammy Tari DASEN, PA-C Pharmacy:   [id number] [place], [place] - [address] AT [facility] OF [address] [place] [address] Phone: [phone] Fax: [phone]     Social Drivers of Health (SDOH) Social History: SDOH Screenings   Tobacco Use: High Risk (06/19/2024)   SDOH Interventions:     Readmission Risk Interventions     No data to display

## 2024-06-20 NOTE — Discharge Summary (Signed)
 [address] [place] [address]             [phone]        Physician Discharge Summary  Patient ID: Sydney [name] MRN: [id number] DOB/AGE: [birth year] 51 y.o.  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)    Anemia, unspecified 05/14/2016   Thyroid  goiter 05/14/2016   Thyroid  nodule 05/14/2016   Constipation 05/14/2016   Wart 05/14/2016   Chronic fatigue 05/14/2016   S/P hysterectomy with oophorectomy 04/30/2016   Lumbar post-laminectomy syndrome 10/23/2015   Lumbar radicular pain 10/23/2015   Essential hypertension 07/12/2015   BPPV (benign paroxysmal positional vertigo) 07/12/2015   Discharge Diagnoses:  Patient Active Problem List   Diagnosis Date Noted   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 06/20/2024   S/P ascending aortic replacement 06/20/2024   Aortic dissection (HCC) 06/19/2024   Acute lower GI bleeding 12/01/2021   GI bleed 06/27/2020   Syncope 06/26/2020   Orthostatic hypotension    Vagina itching    Confusion    Lightheadedness    Rectal bleeding    Acute GI bleeding 08/24/2018   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 She presented to [facility] ED with severe CP that started around 2 AM. 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 She continues to have pain.  Dr. Lucas and Dr. Kerrin reviewed the patient's diagnostic studies and determined she would benefit from surgical intervention. They reviewed the patient's treatment options as well as the risks and benefits of surgery with the patient. Sydney [name] was agreeable to proceed with emergency repair surgery.   Hospital Course:  Sydney [name] was emergently brought to the operating room and underwent ascending aorta repair utilizing a 32mm hemashield platinum graft. She tolerated the procedure well and was transferred to the SICU in stable condition. She was extubated the evening of surgery without complication. She required Cleviprex  for hypertension, this was weaned as tolerated and Lopressor  was titrated. Swan ganz catheter and arterial line were removed on POD1 without complication. She was routinely diuresed and returned to her baseline weight. Epicardial pacing wires and chest tubes were removed without complication. She was felt stable for transfer to the progressive unit. She remained hypertensive and Norvasc  was started.  She was felt stable for transfer to the progressive unit on 09/17 but was unable to get a bed until 9/18. ASA was not started due to history of GI bleed on ASA.  Renal function has remained within normal limits.  She does have an expected acute blood loss anemia which has stabilized.  She began moving her bowels  appropriately. She was ambulating well on room air and her incisions were healing well without sign of infection. She was felt stable for discharge home.   Consults: None  Significant Diagnostic Studies:  CLINICAL DATA:  Sudden onset sharp right-sided chest pain about 1 hour ago. Nausea. Acute aortic syndrome suspected.   EXAM: CT ANGIOGRAPHY CHEST, ABDOMEN AND PELVIS   TECHNIQUE: Non-contrast CT of the chest was initially obtained.   Multidetector CT imaging through the chest, abdomen and pelvis was performed using the standard protocol during bolus administration of intravenous contrast. Multiplanar reconstructed images and MIPs were obtained and reviewed to evaluate the vascular anatomy.   RADIATION DOSE REDUCTION: This exam was performed according to the departmental dose-optimization program which includes automated exposure control, adjustment of the mA and/or kV according to patient size and/or use of iterative reconstruction technique.   CONTRAST:  75mL OMNIPAQUE  IOHEXOL  350 MG/ML SOLN   COMPARISON:  Abdomen pelvis CT 08/24/2018   FINDINGS: CTA CHEST FINDINGS   Cardiovascular: Pre contrast imaging shows a dissection flap in the lumen of the ascending aorta with some high density material in the right lateral aspect of the ascending aorta, likely intramural thrombus.   Imaging after IV contrast administration confirms the presence of a dissection of the ascending thoracic aorta with involvement of the sinuses of Valsalva. Ascending thoracic aorta measures on the order of 5.2 x 5.0 cm diameter. Dissection flap extends distally to region of the right brachycephalic artery. Bovine arch vessel anatomy evident. No evidence for extension of the dissection flap into the common trunk of the right brachycephalic and left common carotid arteries. No evidence for dissection flap involvement of the left subclavian artery. No dissection flap of the descending thoracic aorta.  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   Mediastinum/Nodes: There is some fluid in the mediastinum with higher density suggesting blood products. This appears to result in some mass-effect on the SVC, right main pulmonary artery and some attenuation of the right upper lobe pulmonary artery. There is some high density fluid attenuation in the right hilum and central mediastinum extending into the subcarinal region.   Lungs/Pleura: No suspicious pulmonary nodule or mass. No pneumothorax or pleural effusion. Dependent atelectasis noted in both lower lungs.   Musculoskeletal: No worrisome lytic or sclerotic osseous abnormality.   Review of the MIP images confirms the above findings.   CTA ABDOMEN AND PELVIS FINDINGS   VASCULAR   Aorta: No dissection of the abdominal aorta. No abdominal aortic aneurysm.   Celiac: Patent without evidence of aneurysm, dissection, vasculitis or significant stenosis.   SMA: Patent without evidence of aneurysm, dissection, vasculitis or significant stenosis. Replaced common hepatic artery.   Renals: Both renal arteries are patent without evidence of aneurysm, dissection, vasculitis, fibromuscular dysplasia or significant stenosis.   IMA: Patent without evidence of aneurysm, dissection, vasculitis or significant stenosis.   Inflow: Patent without evidence of aneurysm, dissection, vasculitis or significant stenosis.   Veins: Venous collateralization is seen around the thoracic spine extending into the abdomen. There is heterogeneous mixing of contrast material into the left renal vein, likely as collateralization.  Review of the MIP images confirms the above findings.   NON-VASCULAR   Hepatobiliary: A tiny hypodensity in the liver parenchyma is too small to characterize but is statistically most likely benign. No followup imaging is recommended. Subtle nodularity of the liver contour (see axial 164/5) raises  the question of cirrhosis. Possible subtle periportal edema although this is not well evaluated due to early bolus timing and lack of portal venous opacification. There is no evidence for gallstones, gallbladder wall thickening, or pericholecystic fluid. No intrahepatic or extrahepatic biliary dilation.   Pancreas: No focal mass lesion. No dilatation of the main duct. No intraparenchymal cyst. No peripancreatic edema.   Spleen: No splenomegaly. No suspicious focal mass lesion.   Adrenals/Urinary Tract: No adrenal nodule or mass. Tiny well-defined homogeneous low-density lesion in the right kidney is too small to characterize but statistically most likely benign and probably a cyst. No followup imaging is recommended. Left kidney unremarkable. No evidence for hydroureter. The urinary bladder appears normal for the degree of distention.   Stomach/Bowel: Stomach is unremarkable. No gastric wall thickening. No evidence of outlet obstruction. Surgical clips are noted adjacent to the stomach and in the region of the GE junction. Duodenum is normally positioned as is the ligament of Treitz. No small bowel wall thickening. No small bowel dilatation. The appendix is not well visualized, but there is no edema or inflammation in the region of the cecal tip to suggest appendicitis. Diverticuli are seen scattered along the entire length of the colon without CT findings of diverticulitis. Moderate stool volume noted right and transverse colon.   Lymphatic: There is no gastrohepatic or hepatoduodenal ligament lymphadenopathy. No retroperitoneal or mesenteric lymphadenopathy. No pelvic sidewall lymphadenopathy.   Reproductive: Hysterectomy.  There is no adnexal mass.   Other: No intraperitoneal free fluid.   Musculoskeletal: No worrisome lytic or sclerotic osseous abnormality.   Review of the MIP images confirms the above findings.   IMPRESSION: 1. 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 Dissection flap extends distally to the region of the common trunk of the right brachycephalic and left common carotid arteries. No evidence for extension of the dissection flap into the aortic arch branch vessel anatomy. No dissection flap of the descending thoracic aorta. 2. There is some fluid in the mediastinum with higher density suggesting blood products/hemorrhage. This appears to result in some mass-effect on the SVC and right main pulmonary artery with some attenuation of the right upper lobe pulmonary artery. Apparent blood products are seen in the central mediastinum and extending to the region of the right hilum. 3. Subtle nodularity of the liver contour raises the question of cirrhosis. 4. Colonic diverticulosis without diverticulitis.   Critical Value/emergent results regarding the aortic dissection were called by telephone at the time of interpretation on 06/19/2024 at 5:44 am to provider LAMAR SCHLOSSMAN , who verbally acknowledged these results.     Electronically Signed   By: Camellia Candle M.D.   On: 06/19/2024 06:08  Treatments: surgery:  Surgeon:  Dorise LOIS Fellers, MD First Assistant: Con Bend,  PA-C Preoperative Diagnosis:  Acute Type A aortic dissection Postoperative Diagnosis:  Same Procedure: Median Sternotomy Extracorporeal circulation 3.   Replacement of the ascending aorta (hemi-arch) using a 32 mm Hemashield graft under deep hypothermic circulatory arrest, supra-coronary proximal anastomosis.  Discharge Exam: Blood pressure 114/86, pulse 83, temperature 98.5 F (36.9 C), temperature source Oral, resp. rate 19, height 5' 9 (1.753  m), weight 70.4 kg, last menstrual period 04/26/2016, SpO2 96%.  General appearance: alert, cooperative, and no distress Heart: regular rate and rhythm Lungs: clear to auscultation bilaterally Abdomen: benign Extremities:  no edema Wound: incis healing well   Discharge Medications:  The patient has been discharged on:   1.Beta Blocker:  Yes [  X ]                              No   [   ]                              If No, reason:  2.Ace Inhibitor/ARB: Yes [   ]                                     No  [  X  ]                                     If No, reason: BB titrated for further HR control  3.Statin:   Yes [   ]                  No  [  X ]                  If No, reason: No CAD  4.Arleene:  Yes  [   ]                  No   [ X  ]                  If No, reason: Hx of GI bleed on ASA and no absolute indication of benefit   Patient had ACS upon admission: No  Plavix/P2Y12 inhibitor: Yes [   ]                                      No  [  X ]     Discharge Instructions     Amb Referral to Cardiac Rehabilitation   Complete by: As directed    Diagnosis: Other   After initial evaluation and assessments completed: Virtual Based Care may be provided alone or in conjunction with Phase 2 Cardiac Rehab based on patient barriers.: Yes   Intensive Cardiac Rehabilitation (ICR) [facility] location only OR Traditional Cardiac Rehabilitation (TCR) *If criteria for ICR are not met will enroll in TCR ([facility] only): Yes      Allergies as of 06/24/2024       Reactions   Asa [aspirin]    Gi bleeds per daughter        Medication List     STOP taking these medications    losartan -hydrochlorothiazide  100-12.5 MG tablet Commonly known as: HYZAAR       TAKE these medications    acetaminophen  325 MG tablet Commonly known as: Tylenol  Take 2 tablets (650 mg total) by mouth every 6 (six) hours as needed for mild pain (pain score 1-3).   amLODipine  10 MG tablet Commonly known as: NORVASC  Take 1 tablet (10 mg total) by mouth daily.   Fe  Fum-Vit C-Vit B12-FA Caps capsule Commonly known as: TRIGELS-F FORTE Take 1 capsule by mouth daily after breakfast.   metoprolol  tartrate 50 MG tablet Commonly known  as: LOPRESSOR  Take 1 tablet (50 mg total) by mouth 2 (two) times daily.   traMADol  50 MG tablet Commonly known as: ULTRAM  Take 1 tablet (50 mg total) by mouth every 6 (six) hours as needed for moderate pain (pain score 4-6) or severe pain (pain score 7-10).        Follow-up Information     Lucas Dorise POUR, MD Follow up on 07/19/2024.   Specialty: Cardiothoracic Surgery Why: Follow up appointment is at 10:30AM, please get a chest xray at 9:30AM on the 2nd floor of our building Contact information: [address] [place] [address] [phone]         [facility] at [facility] Follow up on 06/29/2024.   Specialty: Cardiothoracic Surgery Why: Suture removal appointment is at 11:30AM Contact information: [address] [place]  [address] [phone]        [name] Joelle VEAR, MD Follow up on 07/12/2024.   Specialty: Cardiology Why: Cardiology appointment is at 9:00AM Contact information: [address] [place] [address] [phone]         [facility]g Echo at [facility] of [place]. [facility] Follow up on 08/03/2024.   Specialty: Cardiology Why: Echocardiogram is at 11:35 AM Contact information: [address] [place]   [address] [phone]                Signed:  Lemond FORBES Cera, PA-C  06/25/2024, 10:00 AM

## 2024-06-21 ENCOUNTER — Inpatient Hospital Stay (HOSPITAL_COMMUNITY)

## 2024-06-21 LAB — GLUCOSE, CAPILLARY
Glucose-Capillary: 102 mg/dL — ABNORMAL HIGH (ref 70–99)
Glucose-Capillary: 139 mg/dL — ABNORMAL HIGH (ref 70–99)

## 2024-06-21 LAB — CBC
HCT: 29.4 % — ABNORMAL LOW (ref 36.0–46.0)
Hemoglobin: 9.3 g/dL — ABNORMAL LOW (ref 12.0–15.0)
MCH: 28.5 pg (ref 26.0–34.0)
MCHC: 31.6 g/dL (ref 30.0–36.0)
MCV: 90.2 fL (ref 80.0–100.0)
Platelets: 141 K/uL — ABNORMAL LOW (ref 150–400)
RBC: 3.26 MIL/uL — ABNORMAL LOW (ref 3.87–5.11)
RDW: 13.9 % (ref 11.5–15.5)
WBC: 11.3 K/uL — ABNORMAL HIGH (ref 4.0–10.5)
nRBC: 0 % (ref 0.0–0.2)

## 2024-06-21 LAB — BASIC METABOLIC PANEL WITH GFR
Anion gap: 5 (ref 5–15)
BUN: 18 mg/dL (ref 6–20)
CO2: 23 mmol/L (ref 22–32)
Calcium: 7.8 mg/dL — ABNORMAL LOW (ref 8.9–10.3)
Chloride: 106 mmol/L (ref 98–111)
Creatinine, Ser: 1.05 mg/dL — ABNORMAL HIGH (ref 0.44–1.00)
GFR, Estimated: >60 mL/min (ref >60)
Glucose, Bld: 119 mg/dL — ABNORMAL HIGH (ref 70–99)
Potassium: 4.5 mmol/L (ref 3.5–5.1)
Sodium: 134 mmol/L — ABNORMAL LOW (ref 135–145)

## 2024-06-21 LAB — SURGICAL PCR SCREEN
MRSA, PCR: NEGATIVE
Staphylococcus aureus: NEGATIVE

## 2024-06-21 MED ORDER — AMLODIPINE BESYLATE 10 MG PO TABS
10.0000 mg | ORAL_TABLET | Freq: Every day | ORAL | Status: DC
Start: 2024-06-21 — End: 2024-06-24
  Administered 2024-06-21 – 2024-06-24 (×4): 10 mg via ORAL
  Filled 2024-06-21 (×4): qty 1

## 2024-06-21 MED ORDER — POTASSIUM CHLORIDE CRYS ER 20 MEQ PO TBCR
20.0000 meq | EXTENDED_RELEASE_TABLET | Freq: Two times a day (BID) | ORAL | Status: DC
  Administered 2024-06-21 (×2): 20 meq via ORAL
  Filled 2024-06-21 (×2): qty 1

## 2024-06-21 MED ORDER — TORSEMIDE 20 MG PO TABS
40.0000 mg | ORAL_TABLET | Freq: Every day | ORAL | Status: DC
Start: 2024-06-21 — End: 2024-06-22
  Administered 2024-06-21: 40 mg via ORAL
  Filled 2024-06-21: qty 2

## 2024-06-21 MED ORDER — MUPIROCIN 2 % EX OINT
1.0000 | TOPICAL_OINTMENT | Freq: Two times a day (BID) | CUTANEOUS | Status: DC
  Administered 2024-06-21 (×2): 1 via NASAL
  Filled 2024-06-21: qty 22

## 2024-06-21 NOTE — Progress Notes (Signed)
 2 Days Post-Op Procedure(s) (LRB): REPAIR, ANEURYSM, AORTA, THORACIC, ASCENDING USING HEMASHIELD PLATINUM VASCULAR GRAFT (N/A) Subjective: No complaints. More alert. Ambulated this am and sitting up in chair.   Objective: Vital signs in last 24 hours: Temp:  [97 F (36.1 C)-99.1 F (37.3 C)] 98.5 F (36.9 C) (09/17 0315) Pulse Rate:  [80-92] 82 (09/17 0600) Cardiac Rhythm: Normal sinus rhythm (09/17 0000) Resp:  [11-26] 21 (09/17 0600) BP: (99-142)/(79-110) 122/98 (09/17 0600) SpO2:  [93 %-100 %] 93 % (09/17 0600) Arterial Line BP: (102-138)/(63-86) 138/86 (09/16 0845) Weight:  [73.6 kg] 73.6 kg (09/17 0600)  Hemodynamic parameters for last 24 hours: PAP: (13-27)/(3-15) 15/5 CVP:  [0 mmHg-14 mmHg] 14 mmHg CO:  [3.3 L/min] 3.3 L/min CI:  [1.8 L/min/m2] 1.8 L/min/m2  Intake/Output from previous day: 09/16 0701 - 09/17 0700 In: 756.9 [P.O.:480; I.V.:66.6; IV Piggyback:200.3] Out: 2195 [Urine:1795; Chest Tube:400] Intake/Output this shift: Total I/O In: 101.3 [I.V.:1; IV Piggyback:100.3] Out: 595 [Urine:405; Chest Tube:190]  General appearance: alert and cooperative Neurologic: intact Heart: regular rate and rhythm, S1, S2 normal, no murmur Lungs: clear to auscultation bilaterally Extremities: edema mild Wound: dressings dry  Lab Results: Recent Labs    06/20/24 1600 06/21/24 0405  WBC 10.2 11.3*  HGB 10.1* 9.3*  HCT 31.8* 29.4*  PLT 156 141*   BMET:  Recent Labs    06/20/24 1600 06/21/24 0405  NA 135 134*  K 4.5 4.5  CL 102 106  CO2 22 23  GLUCOSE 135* 119*  BUN 16 18  CREATININE 1.16* 1.05*  CALCIUM 7.8* 7.8*    PT/INR:  Recent Labs    06/19/24 1309  LABPROT 16.8*  INR 1.3*   ABG    Component Value Date/Time   PHART 7.360 06/19/2024 1856   HCO3 21.0 06/19/2024 1856   TCO2 22 06/19/2024 1856   ACIDBASEDEF 4.0 (H) 06/19/2024 1856   O2SAT 99 06/19/2024 1856   CBG (last 3)  Recent Labs    06/20/24 1954 06/20/24 2207 06/21/24 0314   GLUCAP 134* 132* 102*   CXR: mild bibasilar atelectasis  Assessment/Plan: S/P Procedure(s) (LRB): REPAIR, ANEURYSM, AORTA, THORACIC, ASCENDING USING HEMASHIELD PLATINUM VASCULAR GRAFT (N/A)  POD 2 Hemodynamically stable in sinus rhythm. Continue Lopressor .  -1438 cc yesterday and wt down 4 lbs. Still about 9 lbs over preop. Continue diuretic and KCL.  DC pacing wires, then chest tubes and sleeve two hours later if stable.  Continue IS, OOB, ambulation.  Can transfer to 4E when pacing wires and tubes out.   LOS: 2 days    Dorise MARLA Fellers 06/21/2024

## 2024-06-21 NOTE — Progress Notes (Signed)
 EVENING ROUNDS NOTE :     [address]       [name] [address]             [phone]                 2 Days Post-Op Procedure(s) (LRB): REPAIR, ANEURYSM, AORTA, THORACIC, ASCENDING USING HEMASHIELD PLATINUM VASCULAR GRAFT (N/A)   Total Length of Stay:  LOS: 2 days  Events:   Stable day Awaiting floor bed    BP (!) 131/96   Pulse 87   Temp 99.2 F (37.3 C) (Oral)   Resp 20   Ht 5' 9 (1.753 m)   Wt 73.6 kg   LMP 04/26/2016   SpO2 91%   BMI 23.96 kg/m          clevidipine  Stopped (06/20/24 0709)   nitroGLYCERIN  Stopped (06/19/24 1245)   phenylephrine  (NEO-SYNEPHRINE) Adult infusion 0 mcg/min (06/19/24 1245)    I/O last 3 completed shifts: In: 976.9 [P.O.:700; I.V.:66.6; Other:10; IV Piggyback:200.3] Out: 2945 [Urine:2545; Chest Tube:400]      Latest Ref Rng & Units 06/21/2024    4:05 AM 06/20/2024    4:00 PM 06/20/2024    4:49 AM  CBC  WBC 4.0 - 10.5 K/uL 11.3  10.2  9.5   Hemoglobin 12.0 - 15.0 g/dL 9.3  89.8  9.6   Hematocrit 36.0 - 46.0 % 29.4  31.8  29.8   Platelets 150 - 400 K/uL 141  156  138        Latest Ref Rng & Units 06/21/2024    4:05 AM 06/20/2024    4:00 PM 06/20/2024    4:49 AM  BMP  Glucose 70 - 99 mg/dL 880  864  879   BUN 6 - 20 mg/dL 18  16  15    Creatinine 0.44 - 1.00 mg/dL 8.94  8.83  8.98   Sodium 135 - 145 mmol/L 134  135  134   Potassium 3.5 - 5.1 mmol/L 4.5  4.5  4.2   Chloride 98 - 111 mmol/L 106  102  109   CO2 22 - 32 mmol/L 23  22  20    Calcium 8.9 - 10.3 mg/dL 7.8  7.8  7.4     ABG    Component Value Date/Time   PHART 7.360 06/19/2024 1856   PCO2ART 37.1 06/19/2024 1856   PO2ART 135 (H) 06/19/2024 1856   HCO3 21.0 06/19/2024 1856   TCO2 22 06/19/2024 1856   ACIDBASEDEF 4.0 (H) 06/19/2024 1856   O2SAT 99 06/19/2024 1856       Linnie Rayas, MD 06/21/2024 8:57 PM

## 2024-06-21 NOTE — Plan of Care (Signed)
  Problem: Clinical Measurements: Goal: Respiratory complications will improve Outcome: Progressing   Problem: Clinical Measurements: Goal: Cardiovascular complication will be avoided Outcome: Progressing   Problem: Clinical Measurements: Goal: Diagnostic test results will improve Outcome: Progressing   Problem: Elimination: Goal: Will not experience complications related to bowel motility Outcome: Progressing   Problem: Activity: Goal: Risk for activity intolerance will decrease Outcome: Progressing

## 2024-06-22 ENCOUNTER — Other Ambulatory Visit: Payer: Self-pay | Admitting: Cardiology

## 2024-06-22 ENCOUNTER — Inpatient Hospital Stay (HOSPITAL_COMMUNITY)

## 2024-06-22 DIAGNOSIS — Z95828 Presence of other vascular implants and grafts: Secondary | ICD-10-CM

## 2024-06-22 LAB — BASIC METABOLIC PANEL WITH GFR
Anion gap: 12 (ref 5–15)
BUN: 17 mg/dL (ref 6–20)
CO2: 25 mmol/L (ref 22–32)
Calcium: 8.2 mg/dL — ABNORMAL LOW (ref 8.9–10.3)
Chloride: 102 mmol/L (ref 98–111)
Creatinine, Ser: 0.84 mg/dL (ref 0.44–1.00)
GFR, Estimated: >60 mL/min (ref >60)
Glucose, Bld: 110 mg/dL — ABNORMAL HIGH (ref 70–99)
Potassium: 4.1 mmol/L (ref 3.5–5.1)
Sodium: 139 mmol/L (ref 135–145)

## 2024-06-22 LAB — CBC
HCT: 28.6 % — ABNORMAL LOW (ref 36.0–46.0)
Hemoglobin: 9.1 g/dL — ABNORMAL LOW (ref 12.0–15.0)
MCH: 28.4 pg (ref 26.0–34.0)
MCHC: 31.8 g/dL (ref 30.0–36.0)
MCV: 89.4 fL (ref 80.0–100.0)
Platelets: 144 K/uL — ABNORMAL LOW (ref 150–400)
RBC: 3.2 MIL/uL — ABNORMAL LOW (ref 3.87–5.11)
RDW: 13.7 % (ref 11.5–15.5)
WBC: 10.7 K/uL — ABNORMAL HIGH (ref 4.0–10.5)
nRBC: 0 % (ref 0.0–0.2)

## 2024-06-22 MED ORDER — ~~LOC~~ CARDIAC SURGERY, PATIENT & FAMILY EDUCATION
Freq: Once | Status: AC
Start: 2024-06-22 — End: 2024-06-22

## 2024-06-22 MED ORDER — TORSEMIDE 20 MG PO TABS
20.0000 mg | ORAL_TABLET | Freq: Every day | ORAL | Status: DC
  Administered 2024-06-22: 20 mg via ORAL
  Filled 2024-06-22: qty 1

## 2024-06-22 MED ORDER — FE FUM-VIT C-VIT B12-FA 460-60-0.01-1 MG PO CAPS
1.0000 | ORAL_CAPSULE | Freq: Every day | ORAL | Status: DC
  Administered 2024-06-22 – 2024-06-24 (×3): 1 via ORAL
  Filled 2024-06-22 (×3): qty 1

## 2024-06-22 MED ORDER — OXYCODONE HCL 5 MG PO TABS
5.0000 mg | ORAL_TABLET | ORAL | Status: DC | PRN
  Administered 2024-06-24: 5 mg via ORAL
  Filled 2024-06-22: qty 1

## 2024-06-22 MED ORDER — SODIUM CHLORIDE 0.9% FLUSH: 3.0000 mL | INTRAVENOUS | Status: DC | PRN

## 2024-06-22 MED ORDER — SODIUM CHLORIDE 0.9% FLUSH
3.0000 mL | Freq: Two times a day (BID) | INTRAVENOUS | Status: DC
  Administered 2024-06-22 – 2024-06-23 (×4): 3 mL via INTRAVENOUS

## 2024-06-22 MED ORDER — LACTULOSE 10 GM/15ML PO SOLN
20.0000 g | Freq: Every day | ORAL | Status: DC | PRN
  Administered 2024-06-22: 20 g via ORAL
  Filled 2024-06-22: qty 30

## 2024-06-22 MED ORDER — POTASSIUM CHLORIDE CRYS ER 20 MEQ PO TBCR
20.0000 meq | EXTENDED_RELEASE_TABLET | Freq: Two times a day (BID) | ORAL | Status: AC
  Administered 2024-06-22 (×2): 20 meq via ORAL
  Filled 2024-06-22 (×2): qty 1

## 2024-06-22 MED ORDER — TRAMADOL HCL 50 MG PO TABS
50.0000 mg | ORAL_TABLET | ORAL | Status: DC | PRN
  Administered 2024-06-23 (×2): 50 mg via ORAL
  Filled 2024-06-22 (×2): qty 1

## 2024-06-22 MED ORDER — SODIUM CHLORIDE 0.9 % IV SOLN: 250.0000 mL | INTRAVENOUS | Status: AC | PRN

## 2024-06-22 NOTE — Plan of Care (Signed)
  Problem: Education: Goal: Knowledge of General Education information will improve Description: Including pain rating scale, medication(s)/side effects and non-pharmacologic comfort measures Outcome: Progressing   Problem: Health Behavior/Discharge Planning: Goal: Ability to manage health-related needs will improve Outcome: Progressing   Problem: Clinical Measurements: Goal: Ability to maintain clinical measurements within normal limits will improve Outcome: Progressing Goal: Will remain free from infection Outcome: Progressing Goal: Diagnostic test results will improve Outcome: Progressing Goal: Respiratory complications will improve Outcome: Progressing Goal: Cardiovascular complication will be avoided Outcome: Progressing   Problem: Activity: Goal: Risk for activity intolerance will decrease Outcome: Progressing   Problem: Nutrition: Goal: Adequate nutrition will be maintained Outcome: Progressing   Problem: Coping: Goal: Level of anxiety will decrease Outcome: Progressing   Problem: Elimination: Goal: Will not experience complications related to bowel motility Outcome: Progressing Goal: Will not experience complications related to urinary retention Outcome: Progressing   Problem: Pain Managment: Goal: General experience of comfort will improve and/or be controlled Outcome: Progressing   Problem: Safety: Goal: Ability to remain free from injury will improve Outcome: Progressing   Problem: Skin Integrity: Goal: Risk for impaired skin integrity will decrease Outcome: Progressing   Problem: Activity: Goal: Risk for activity intolerance will decrease Outcome: Progressing   Problem: Cardiac: Goal: Will achieve and/or maintain hemodynamic stability Outcome: Progressing   Problem: Clinical Measurements: Goal: Postoperative complications will be avoided or minimized Outcome: Progressing   Problem: Respiratory: Goal: Respiratory status will improve Outcome:  Progressing   Problem: Skin Integrity: Goal: Wound healing without signs and symptoms of infection Outcome: Progressing Goal: Risk for impaired skin integrity will decrease Outcome: Progressing   Problem: Urinary Elimination: Goal: Ability to achieve and maintain adequate renal perfusion and functioning will improve Outcome: Progressing

## 2024-06-22 NOTE — TOC Progression Note (Signed)
 Transition of Care [facility]) - Progression Note    Patient Details  Name: NEVIN KOZUCH MRN: [id number] Date of Birth: 05-08-1971  Transition of [facility]) CM/SW Contact  Justina Delcia Czar, RN Phone Number: [phone] 06/22/2024, 3:56 PM  Clinical Narrative:     Spoke to pt and states her benefits from her job will not start until November. States she wants to apply for disability. Inpatient CM sent referral to Financial Counselor to assist with referral to [facility].   Expected Discharge Plan: Home/Self Care Barriers to Discharge: Continued Medical Work up      Expected Discharge Plan and Services   Discharge Planning Services: CM Consult   Living arrangements for the past 2 months: Single Family Home                   Social Drivers of Health (SDOH) Interventions SDOH Screenings   Food Insecurity: Food Insecurity Present (06/21/2024)  Housing: High Risk (06/21/2024)  Transportation Needs: No Transportation Needs (06/21/2024)  Utilities: Not At Risk (06/21/2024)  Tobacco Use: High Risk (06/19/2024)    Readmission Risk Interventions     No data to display

## 2024-06-22 NOTE — Progress Notes (Signed)
 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 Results to Dr. Ren Ny and Dr. Lucas

## 2024-06-22 NOTE — Progress Notes (Signed)
 3 Days Post-Op Procedure(s) (LRB): REPAIR, ANEURYSM, AORTA, THORACIC, ASCENDING USING HEMASHIELD PLATINUM VASCULAR GRAFT (N/A) Subjective: No complaints. Ambulating well. No BM yet.  Objective: Vital signs in last 24 hours: Temp:  [98.6 F (37 C)-99.2 F (37.3 C)] 99.2 F (37.3 C) (09/17 1659) Pulse Rate:  [80-93] 82 (09/18 0643) Cardiac Rhythm: Normal sinus rhythm (09/18 0400) Resp:  [14-29] 20 (09/17 1400) BP: (105-158)/(80-117) 129/94 (09/18 0643) SpO2:  [89 %-100 %] 98 % (09/18 0643) Weight:  [69.8 kg] 69.8 kg (09/18 0500)  Hemodynamic parameters for last 24 hours:    Intake/Output from previous day: 09/17 0701 - 09/18 0700 In: 223 [P.O.:220; I.V.:3] Out: 750 [Urine:750] Intake/Output this shift: Total I/O In: 3 [I.V.:3] Out: -   General appearance: alert and cooperative Neurologic: intact Heart: regular rate and rhythm, S1, S2 normal, no murmur Lungs: clear to auscultation bilaterally Extremities: no edema Wound: incisions healing well  Lab Results: Recent Labs    06/21/24 0405 06/22/24 0241  WBC 11.3* 10.7*  HGB 9.3* 9.1*  HCT 29.4* 28.6*  PLT 141* 144*   BMET:  Recent Labs    06/21/24 0405 06/22/24 0241  NA 134* 139  K 4.5 4.1  CL 106 102  CO2 23 25  GLUCOSE 119* 110*  BUN 18 17  CREATININE 1.05* 0.84  CALCIUM 7.8* 8.2*    PT/INR:  Recent Labs    06/19/24 1309  LABPROT 16.8*  INR 1.3*   ABG    Component Value Date/Time   PHART 7.360 06/19/2024 1856   HCO3 21.0 06/19/2024 1856   TCO2 22 06/19/2024 1856   ACIDBASEDEF 4.0 (H) 06/19/2024 1856   O2SAT 99 06/19/2024 1856   CBG (last 3)  Recent Labs    06/20/24 2207 06/21/24 0314 06/21/24 0756  GLUCAP 132* 102* 139*   CXR: mild atelectasis  Assessment/Plan: S/P Procedure(s) (LRB): REPAIR, ANEURYSM, AORTA, THORACIC, ASCENDING USING HEMASHIELD PLATINUM VASCULAR GRAFT (N/A)  POD 3  Hemodynamically stable. BP control better with addition of Norvasc  to  Lopressor .  Diuresed well yesterday. Wt down 8 lbs. She is about 1.4 lbs over preop. Will continue demedex 20 for 2 more days. Replace K+  Continue IS, ambulation.  Transfer to 4E.   LOS: 3 days    Dorise MARLA Fellers 06/22/2024

## 2024-06-23 ENCOUNTER — Telehealth (HOSPITAL_COMMUNITY): Payer: Self-pay | Admitting: Pharmacy Technician

## 2024-06-23 ENCOUNTER — Other Ambulatory Visit (HOSPITAL_COMMUNITY): Payer: Self-pay

## 2024-06-23 LAB — BPAM RBC
Blood Product Expiration Date: 202509212359
Blood Product Expiration Date: 202509212359
Blood Product Expiration Date: 202509222359
Blood Product Expiration Date: 202509292359
Blood Product Expiration Date: 202510052359
Blood Product Expiration Date: 202510112359
ISSUE DATE / TIME: 202509150819
ISSUE DATE / TIME: 202509150819
ISSUE DATE / TIME: 202509170346
ISSUE DATE / TIME: 202509170555
Unit Type and Rh: 5100
Unit Type and Rh: 5100
Unit Type and Rh: 5100
Unit Type and Rh: 5100
Unit Type and Rh: 5100
Unit Type and Rh: 5100

## 2024-06-23 LAB — TYPE AND SCREEN
ABO/RH(D): O POS
Antibody Screen: NEGATIVE
Unit division: 0
Unit division: 0
Unit division: 0
Unit division: 0
Unit division: 0
Unit division: 0

## 2024-06-23 LAB — BASIC METABOLIC PANEL WITH GFR
Anion gap: 10 (ref 5–15)
BUN: 12 mg/dL (ref 6–20)
CO2: 25 mmol/L (ref 22–32)
Calcium: 8.5 mg/dL — ABNORMAL LOW (ref 8.9–10.3)
Chloride: 104 mmol/L (ref 98–111)
Creatinine, Ser: 0.91 mg/dL (ref 0.44–1.00)
GFR, Estimated: >60 mL/min (ref >60)
Glucose, Bld: 143 mg/dL — ABNORMAL HIGH (ref 70–99)
Potassium: 4 mmol/L (ref 3.5–5.1)
Sodium: 139 mmol/L (ref 135–145)

## 2024-06-23 NOTE — Progress Notes (Signed)
 CARDIAC REHAB PHASE I   PRE:  Rate/Rhythm:     BP: sitting     SpO2:   MODE:  Ambulation: 430 ft   POST:  Rate/Rhythm: 82 SR    BP: sitting 116/93     SpO2: 92 RA   Pt tolerated fairly well. She walked without AD, contact guard assist. Wobbly at times. She c/o fatigue toward end and needed x2 rest stops. To bed (had just transferred).  Discussed with pt IS, sternal precautions, exercise, and CRPII. Gave her heart healthy diet. Pt receptive. Will refer to [facility] CRPII.  [phone]  Aliene Aris BS, ACSM-CEP 06/23/2024 12:21 PM

## 2024-06-23 NOTE — Progress Notes (Signed)
 Pt admitted to rm 4 from [facility]. Oriented pt to the unit. Initiated tele. Vss. Call bell within reach.   Amado GORMAN Arabia, RN

## 2024-06-23 NOTE — Telephone Encounter (Signed)
 Pharmacy Patient Advocate Encounter  Insurance verification completed.    The patient is insured through [facility] and [employer] Medicaid.     Ran test claim for Metoprolol  25mg , amlodopine 5mg  and the current 30 day co-pay is $4.00.   This test claim was processed through [facility]- copay amounts may vary at other pharmacies due to pharmacy/plan contracts, or as the patient moves through the different stages of their insurance plan.

## 2024-06-23 NOTE — Progress Notes (Signed)
 4 Days Post-Op Procedure(s) (LRB): REPAIR, ANEURYSM, AORTA, THORACIC, ASCENDING USING HEMASHIELD PLATINUM VASCULAR GRAFT (N/A) Subjective:  No complaints. Ambulating well, bowels working. Anxious to get home.  Objective: Vital signs in last 24 hours: Temp:  [98 F (36.7 C)-98.8 F (37.1 C)] 98 F (36.7 C) (09/19 0400) Pulse Rate:  [76-93] 80 (09/19 0700) Cardiac Rhythm: Normal sinus rhythm (09/19 0400) Resp:  [19-20] 20 (09/18 1600) BP: (90-130)/(71-101) 115/93 (09/19 0700) SpO2:  [87 %-99 %] 94 % (09/19 0700) Weight:  [69.8 kg] 69.8 kg (09/19 0500)  Hemodynamic parameters for last 24 hours:    Intake/Output from previous day: 09/18 0701 - 09/19 0700 In: 123 [P.O.:120; I.V.:3] Out: -  Intake/Output this shift: No intake/output data recorded.  General appearance: alert and cooperative Neurologic: intact Heart: regular rate and rhythm, S1, S2 normal, no murmur Lungs: clear to auscultation bilaterally Extremities: no edema Wound: incisions ok  Lab Results: Recent Labs    06/21/24 0405 06/22/24 0241  WBC 11.3* 10.7*  HGB 9.3* 9.1*  HCT 29.4* 28.6*  PLT 141* 144*   BMET:  Recent Labs    06/21/24 0405 06/22/24 0241  NA 134* 139  K 4.5 4.1  CL 106 102  CO2 23 25  GLUCOSE 119* 110*  BUN 18 17  CREATININE 1.05* 0.84  CALCIUM 7.8* 8.2*    PT/INR: No results for input(s): LABPROT, INR in the last 72 hours. ABG    Component Value Date/Time   PHART 7.360 06/19/2024 1856   HCO3 21.0 06/19/2024 1856   TCO2 22 06/19/2024 1856   ACIDBASEDEF 4.0 (H) 06/19/2024 1856   O2SAT 99 06/19/2024 1856   CBG (last 3)  Recent Labs    06/20/24 2207 06/21/24 0314 06/21/24 0756  GLUCAP 132* 102* 139*    Assessment/Plan: S/P Procedure(s) (LRB): REPAIR, ANEURYSM, AORTA, THORACIC, ASCENDING USING HEMASHIELD PLATINUM VASCULAR GRAFT (N/A)  POD 4  Hemodynamically stable with good BP control at this point on Lopressor  50 bid and Norvasc  10. Will continue  this for now and see how her BP is in the office before considering other meds.  Wt is about back to baseline with no edema so stopping diuretic.  Continue IS, ambulation.  Not putting her on ASA to due GI bleeding on ASA. With her surgery there is no absolute benefit of ASA.  Waiting on 4E bed. Plan home tomorrow.   LOS: 4 days    Dorise MARLA Fellers 06/23/2024

## 2024-06-24 MED ORDER — METOPROLOL TARTRATE 50 MG PO TABS: 50.0000 mg | ORAL_TABLET | Freq: Two times a day (BID) | ORAL | 1 refills | Status: DC

## 2024-06-24 MED ORDER — AMLODIPINE BESYLATE 10 MG PO TABS: 10.0000 mg | ORAL_TABLET | Freq: Every day | ORAL | 1 refills | Status: DC

## 2024-06-24 MED ORDER — FE FUM-VIT C-VIT B12-FA 460-60-0.01-1 MG PO CAPS: 1.0000 | ORAL_CAPSULE | Freq: Every day | ORAL | Status: DC

## 2024-06-24 MED ORDER — TRAMADOL HCL 50 MG PO TABS: 50.0000 mg | ORAL_TABLET | Freq: Four times a day (QID) | ORAL | 0 refills | Status: DC | PRN

## 2024-06-24 MED ORDER — ACETAMINOPHEN 325 MG PO TABS: 650.0000 mg | ORAL_TABLET | Freq: Four times a day (QID) | ORAL | Status: DC | PRN

## 2024-06-24 NOTE — Progress Notes (Signed)
 Discharge education provided to the patient, PIV removed, CCMD notified, pt is aware to pick up TOC medications, had no any concerns during d/c.

## 2024-06-24 NOTE — Plan of Care (Signed)

## 2024-06-24 NOTE — Progress Notes (Signed)
 5 Days Post-Op Procedure(s) (LRB): REPAIR, ANEURYSM, AORTA, THORACIC, ASCENDING USING HEMASHIELD PLATINUM VASCULAR GRAFT (N/A) Subjective: Looks and feels well, some minor pains  Objective: Vital signs in last 24 hours: Temp:  [98.1 F (36.7 C)-98.9 F (37.2 C)] 98.7 F (37.1 C) (09/20 0308) Pulse Rate:  [74-86] 76 (09/20 0308) Cardiac Rhythm: Normal sinus rhythm (09/19 1900) Resp:  [14-20] 16 (09/20 0308) BP: (100-136)/(77-101) 136/101 (09/20 0308) SpO2:  [94 %-97 %] 94 % (09/20 0308) Weight:  [70.4 kg] 70.4 kg (09/20 0500)  Hemodynamic parameters for last 24 hours:    Intake/Output from previous day: 09/19 0701 - 09/20 0700 In: 250 [P.O.:240; I.V.:10] Out: -  Intake/Output this shift: No intake/output data recorded.  General appearance: alert, cooperative, and no distress Heart: regular rate and rhythm Lungs: clear to auscultation bilaterally Abdomen: benign Extremities: no edema Wound: incis healing well  Lab Results: Recent Labs    06/22/24 0241  WBC 10.7*  HGB 9.1*  HCT 28.6*  PLT 144*   BMET:  Recent Labs    06/22/24 0241 06/23/24 0740  NA 139 139  K 4.1 4.0  CL 102 104  CO2 25 25  GLUCOSE 110* 143*  BUN 17 12  CREATININE 0.84 0.91  CALCIUM 8.2* 8.5*    PT/INR: No results for input(s): LABPROT, INR in the last 72 hours. ABG    Component Value Date/Time   PHART 7.360 06/19/2024 1856   HCO3 21.0 06/19/2024 1856   TCO2 22 06/19/2024 1856   ACIDBASEDEF 4.0 (H) 06/19/2024 1856   O2SAT 99 06/19/2024 1856   CBG (last 3)  Recent Labs    06/21/24 0756  GLUCAP 139*    Meds Scheduled Meds:  acetaminophen   1,000 mg Oral Q6H   Or   acetaminophen  (TYLENOL ) oral liquid 160 mg/5 mL  1,000 mg Per Tube Q6H   amLODipine   10 mg Oral Daily   enoxaparin  (LOVENOX ) injection  40 mg Subcutaneous QHS   Fe Fum-Vit C-Vit B12-FA  1 capsule Oral QPC breakfast   metoprolol  tartrate  50 mg Oral BID   sodium chloride  flush  3 mL Intravenous Q12H    Continuous Infusions: PRN Meds:.ondansetron  (ZOFRAN ) IV, oxyCODONE , sodium chloride  flush, traMADol   Xrays No results found.  Assessment/Plan: S/P Procedure(s) (LRB): REPAIR, ANEURYSM, AORTA, THORACIC, ASCENDING USING HEMASHIELD PLATINUM VASCULAR GRAFT (N/A) POD#5  1 afeb, VSS,good BP control,  sinus rhythm 2 O2 sats good on RA 3 weight approx even with preop 4 no new labs or xrays 5 stable for D/C, reviewed instructions  LOS: 5 days    Lemond FORBES Cera PA-C Pager [phone] 06/24/2024

## 2024-06-24 NOTE — Progress Notes (Signed)
 CARDIAC REHAB PHASE I   PRE:  Rate/Rhythm: NSR  BP:  Sitting: 135/98       SaO2: 97%  MODE:  Ambulation: 440 ft   POST:  Rate/Rhythm: NSR  BP:  Sitting: 115/88       SaO2: 96%  Pt ambulated in hallway with independent gait. Pt took break mid way to sit and rest due to fatigue. Pt back to bedside with no complaints other than fatigue. Pt for discharge home this am. Pt has no questions regarding her CR education.   Alm Parkins, MS, ACSM EP-C, CCRP 06/24/2024  8:20-8:38

## 2024-06-26 ENCOUNTER — Other Ambulatory Visit: Payer: Self-pay

## 2024-06-26 ENCOUNTER — Other Ambulatory Visit: Payer: Self-pay | Admitting: *Deleted

## 2024-06-26 DIAGNOSIS — Z95828 Presence of other vascular implants and grafts: Secondary | ICD-10-CM

## 2024-06-26 MED ORDER — OXYCODONE HCL 5 MG PO CAPS: 5.0000 mg | ORAL_CAPSULE | Freq: Four times a day (QID) | ORAL | 0 refills | Status: AC | PRN

## 2024-06-26 NOTE — Progress Notes (Signed)
-  Patient called and stated that tramadol  was not helping with pain -Sent in oxycodone  5 mg every 6 hours for pain  -She should continue to use tylenol  as needed for pain

## 2024-06-28 ENCOUNTER — Telehealth (HOSPITAL_COMMUNITY): Payer: Self-pay

## 2024-06-28 NOTE — Telephone Encounter (Signed)
 Incomplete Medicaid form received, will send again to Dr. Azobou.

## 2024-06-28 NOTE — Telephone Encounter (Signed)
 Called patient to see if she is interested in the Cardiac Rehab Program. Patient expressed interest. Explained scheduling process and went over insurance, patient verbalized understanding. Will contact patient for scheduling once f/u has been completed.

## 2024-06-28 NOTE — Telephone Encounter (Signed)
 Pt insurance is active and benefits verified through [facility] Co-pay $4, DED 0/0 met, out of pocket 0/0 met, co-insurance 0%. no pre-authorization required for TCR ONLY, [employer] 06/28/2024@8 :53 , REF# [id number]  TCR/ICR? TCR ONLY Visit(date of service)limitation? 27 vists Can multiple codes be used on the same date of service/visit?(IF ITS A LIMIT) yes  Is this a lifetime maximum or an annual maximum? annual Has the member used any of these services to date? no Is there a time limit (weeks/months) on start of program and/or program completion? no  Will contact patient to see if she is interested in the Cardiac Rehab Program. If interested, patient will need to complete follow up appt. Once completed, patient will be contacted for scheduling upon review by the RN Navigator.

## 2024-06-29 ENCOUNTER — Ambulatory Visit: Payer: Self-pay | Attending: Surgery

## 2024-06-29 DIAGNOSIS — Z4802 Encounter for removal of sutures: Secondary | ICD-10-CM

## 2024-06-29 NOTE — Progress Notes (Unsigned)
 Patient arrived for nurse visit to remove suture/staples post- procedure TAA repair with Dr. Lucas 06/19/24.  Two Sutures removed with no signs/ symptoms of infection noted.  Incisions well approximated and looked free of infection. Patient tolerated procedure well.  Patient instructed to keep the incision sites clean and dry.  Patient acknowledged instructions given.    Patient had several questions about being tired and not being about to do as much walking. Advised that it is normal, especially right after surgery but she should walk several times a day and do a little more every day.  Patient is also concerned about numbness in her right hand. She states that she is right handed and has been dropping things and cannot write with this hand. Advised that this can happen as a result of positioning during surgery but should resolve over time. Spoke with Manuelita, [place] who also states time is needed to help resolve issues.

## 2024-07-03 ENCOUNTER — Telehealth: Payer: Self-pay | Admitting: *Deleted

## 2024-07-03 NOTE — Telephone Encounter (Signed)
 Patient contacted the office regarding numbness and pain in her right arm. States numbness is in her right hand. States she can not feel any sensations between her fingers. States her entire right arm hurts. States she is worried she is not getting circulation in right arm. Patient states arm is slightly swollen. States it is warm to touch. Asked patient to check her capillary refill. She reports her right hand is gray in color. States color of right hand is different than left hand. Advised if color is different she should be evaluated in the ED. Discussed above findings with clinic PA who suggested the same. Patient also requesting refill of oxycodone . States 5mg  of oxycodone  is not helping her pain and is requesting a higher dosage is tablet form. Advised if the severe pain is in her arm she will need to be seen in the ED for evaluation and treatment. Advised if refill of pain medication is for surgical chest incision she can resume taking her previously prescribed Tramadol . Patient verbalized understanding and states she is going to the ED for evaluation.

## 2024-07-06 NOTE — Telephone Encounter (Signed)
 Patient was called and advised to contact cardiology regarding additional pain medication post hospital admission from her Cardiac dissection. Patient was not treated by [facility] for this and did not receive pain medication for this. Patient understood.

## 2024-07-06 NOTE — Progress Notes (Signed)
 This encounter was created in error - please disregard.

## 2024-07-08 ENCOUNTER — Other Ambulatory Visit: Payer: Self-pay

## 2024-07-08 ENCOUNTER — Other Ambulatory Visit (HOSPITAL_COMMUNITY): Payer: Self-pay

## 2024-07-08 ENCOUNTER — Emergency Department (HOSPITAL_COMMUNITY)

## 2024-07-08 ENCOUNTER — Encounter (HOSPITAL_COMMUNITY): Payer: Self-pay | Admitting: Emergency Medicine

## 2024-07-08 ENCOUNTER — Observation Stay (HOSPITAL_COMMUNITY)
Admission: EM | Admit: 2024-07-08 | Discharge: 2024-07-09 | Disposition: A | Discharge status: Home / Self Care | Attending: Thoracic Surgery (Cardiothoracic Vascular Surgery) | Admitting: Thoracic Surgery (Cardiothoracic Vascular Surgery)

## 2024-07-08 DIAGNOSIS — R200 Anesthesia of skin: Secondary | ICD-10-CM | POA: Diagnosis not present

## 2024-07-08 DIAGNOSIS — Z952 Presence of prosthetic heart valve: Secondary | ICD-10-CM | POA: Insufficient documentation

## 2024-07-08 DIAGNOSIS — Z90722 Acquired absence of ovaries, bilateral: Secondary | ICD-10-CM | POA: Diagnosis not present

## 2024-07-08 DIAGNOSIS — T82858A Stenosis of vascular prosthetic devices, implants and grafts, initial encounter: Secondary | ICD-10-CM

## 2024-07-08 DIAGNOSIS — I7100 Dissection of unspecified site of aorta: Secondary | ICD-10-CM | POA: Insufficient documentation

## 2024-07-08 DIAGNOSIS — I7101 Dissection of ascending aorta: Secondary | ICD-10-CM

## 2024-07-08 DIAGNOSIS — F17210 Nicotine dependence, cigarettes, uncomplicated: Secondary | ICD-10-CM | POA: Diagnosis not present

## 2024-07-08 DIAGNOSIS — I721 Aneurysm of artery of upper extremity: Secondary | ICD-10-CM

## 2024-07-08 DIAGNOSIS — Z7901 Long term (current) use of anticoagulants: Secondary | ICD-10-CM | POA: Diagnosis not present

## 2024-07-08 DIAGNOSIS — R202 Paresthesia of skin: Secondary | ICD-10-CM | POA: Diagnosis present

## 2024-07-08 DIAGNOSIS — Z79899 Other long term (current) drug therapy: Secondary | ICD-10-CM

## 2024-07-08 DIAGNOSIS — G540 Brachial plexus disorders: Principal | ICD-10-CM | POA: Diagnosis present

## 2024-07-08 DIAGNOSIS — Z95828 Presence of other vascular implants and grafts: Secondary | ICD-10-CM

## 2024-07-08 DIAGNOSIS — M79601 Pain in right arm: Secondary | ICD-10-CM | POA: Diagnosis not present

## 2024-07-08 DIAGNOSIS — G8918 Other acute postprocedural pain: Principal | ICD-10-CM | POA: Insufficient documentation

## 2024-07-08 DIAGNOSIS — Z9889 Other specified postprocedural states: Secondary | ICD-10-CM | POA: Insufficient documentation

## 2024-07-08 DIAGNOSIS — I700 Atherosclerosis of aorta: Secondary | ICD-10-CM | POA: Diagnosis not present

## 2024-07-08 DIAGNOSIS — I10 Essential (primary) hypertension: Secondary | ICD-10-CM | POA: Insufficient documentation

## 2024-07-08 DIAGNOSIS — I82C11 Acute embolism and thrombosis of right internal jugular vein: Secondary | ICD-10-CM

## 2024-07-08 DIAGNOSIS — R5382 Chronic fatigue, unspecified: Secondary | ICD-10-CM | POA: Diagnosis not present

## 2024-07-08 DIAGNOSIS — Z87891 Personal history of nicotine dependence: Secondary | ICD-10-CM | POA: Diagnosis not present

## 2024-07-08 LAB — BASIC METABOLIC PANEL WITH GFR
Anion gap: 12 (ref 5–15)
BUN: 16 mg/dL (ref 6–20)
CO2: 21 mmol/L — ABNORMAL LOW (ref 22–32)
Calcium: 8.8 mg/dL — ABNORMAL LOW (ref 8.9–10.3)
Chloride: 105 mmol/L (ref 98–111)
Creatinine, Ser: 1.01 mg/dL — ABNORMAL HIGH (ref 0.44–1.00)
GFR, Estimated: >60 mL/min (ref >60)
Glucose, Bld: 127 mg/dL — ABNORMAL HIGH (ref 70–99)
Potassium: 3.7 mmol/L (ref 3.5–5.1)
Sodium: 138 mmol/L (ref 135–145)

## 2024-07-08 LAB — CBC
HCT: 34.6 % — ABNORMAL LOW (ref 36.0–46.0)
Hemoglobin: 10.4 g/dL — ABNORMAL LOW (ref 12.0–15.0)
MCH: 27.4 pg (ref 26.0–34.0)
MCHC: 30.1 g/dL (ref 30.0–36.0)
MCV: 91.3 fL (ref 80.0–100.0)
Platelets: 494 K/uL — ABNORMAL HIGH (ref 150–400)
RBC: 3.79 MIL/uL — ABNORMAL LOW (ref 3.87–5.11)
RDW: 14.7 % (ref 11.5–15.5)
WBC: 7.9 K/uL (ref 4.0–10.5)
nRBC: 0 % (ref 0.0–0.2)

## 2024-07-08 LAB — TROPONIN I (HIGH SENSITIVITY)
Troponin I (High Sensitivity): <2 ng/L (ref <18)
Troponin I (High Sensitivity): <2 ng/L (ref <18)

## 2024-07-08 MED ORDER — HYDROMORPHONE HCL 2 MG PO TABS
2.0000 mg | ORAL_TABLET | ORAL | Status: DC | PRN
  Administered 2024-07-08 – 2024-07-09 (×4): 2 mg via ORAL
  Filled 2024-07-08 (×4): qty 1

## 2024-07-08 MED ORDER — FENTANYL CITRATE PF 50 MCG/ML IJ SOSY
50.0000 ug | PREFILLED_SYRINGE | Freq: Once | INTRAMUSCULAR | Status: AC
  Administered 2024-07-08: 50 ug via INTRAVENOUS
  Filled 2024-07-08 (×3): qty 1

## 2024-07-08 MED ORDER — HYDROMORPHONE HCL 1 MG/ML IJ SOLN
0.5000 mg | Freq: Once | INTRAMUSCULAR | Status: AC
  Administered 2024-07-08: 0.5 mg via INTRAVENOUS
  Filled 2024-07-08: qty 1

## 2024-07-08 MED ORDER — ACETAMINOPHEN 500 MG PO TABS
1000.0000 mg | ORAL_TABLET | Freq: Four times a day (QID) | ORAL | Status: DC
  Administered 2024-07-08 (×3): 1000 mg via ORAL
  Filled 2024-07-08 (×4): qty 2

## 2024-07-08 MED ORDER — ENSURE PLUS HIGH PROTEIN PO LIQD
237.0000 mL | Freq: Two times a day (BID) | ORAL | Status: DC
  Administered 2024-07-09: 237 mL via ORAL

## 2024-07-08 MED ORDER — AMLODIPINE BESYLATE 10 MG PO TABS
10.0000 mg | ORAL_TABLET | Freq: Every day | ORAL | Status: DC
  Administered 2024-07-08 – 2024-07-09 (×2): 10 mg via ORAL
  Filled 2024-07-08: qty 1
  Filled 2024-07-08: qty 2

## 2024-07-08 MED ORDER — PREGABALIN 25 MG PO CAPS
25.0000 mg | ORAL_CAPSULE | Freq: Two times a day (BID) | ORAL | Status: DC
  Administered 2024-07-08 – 2024-07-09 (×3): 25 mg via ORAL
  Filled 2024-07-08 (×3): qty 1

## 2024-07-08 MED ORDER — ZOLPIDEM TARTRATE 5 MG PO TABS
5.0000 mg | ORAL_TABLET | Freq: Every evening | ORAL | Status: DC | PRN
  Administered 2024-07-09: 5 mg via ORAL
  Filled 2024-07-08: qty 1

## 2024-07-08 MED ORDER — APIXABAN 5 MG PO TABS
10.0000 mg | ORAL_TABLET | Freq: Two times a day (BID) | ORAL | Status: DC
  Administered 2024-07-08 – 2024-07-09 (×3): 10 mg via ORAL
  Filled 2024-07-08 (×3): qty 2

## 2024-07-08 MED ORDER — POLYETHYLENE GLYCOL 3350 17 G PO PACK: 17.0000 g | PACK | Freq: Every day | ORAL | Status: DC | PRN

## 2024-07-08 MED ORDER — HYDROMORPHONE HCL 1 MG/ML IJ SOLN
1.0000 mg | INTRAMUSCULAR | Status: DC | PRN
  Administered 2024-07-08 – 2024-07-09 (×3): 1 mg via INTRAVENOUS
  Filled 2024-07-08 (×3): qty 1

## 2024-07-08 MED ORDER — APIXABAN 5 MG PO TABS: 5.0000 mg | ORAL_TABLET | Freq: Two times a day (BID) | ORAL | Status: DC

## 2024-07-08 MED ORDER — METHOCARBAMOL 500 MG PO TABS
500.0000 mg | ORAL_TABLET | Freq: Three times a day (TID) | ORAL | Status: DC | PRN
  Administered 2024-07-09: 500 mg via ORAL
  Filled 2024-07-08: qty 1

## 2024-07-08 MED ORDER — IOHEXOL 350 MG/ML SOLN
100.0000 mL | Freq: Once | INTRAVENOUS | Status: AC | PRN
Start: 2024-07-08 — End: 2024-07-08
  Administered 2024-07-08: 100 mL via INTRAVENOUS

## 2024-07-08 MED ORDER — FE FUM-VIT C-VIT B12-FA 460-60-0.01-1 MG PO CAPS
1.0000 | ORAL_CAPSULE | Freq: Every day | ORAL | Status: DC
  Administered 2024-07-08 – 2024-07-09 (×2): 1 via ORAL
  Filled 2024-07-08 (×2): qty 1

## 2024-07-08 MED ORDER — METOPROLOL TARTRATE 50 MG PO TABS
50.0000 mg | ORAL_TABLET | Freq: Two times a day (BID) | ORAL | Status: DC
  Administered 2024-07-08 – 2024-07-09 (×3): 50 mg via ORAL
  Filled 2024-07-08: qty 1
  Filled 2024-07-08: qty 2
  Filled 2024-07-08: qty 1

## 2024-07-08 NOTE — ED Triage Notes (Signed)
 Pt in with R arm pain and numbness, had recent emergent thoracic aneurysm repair for aoritc dissection on 9/15, was discharged 4 days later. States this R arm pain has persisted since her ED visit on 9/15, but pain has significantly worsened. States the pain radiates into her R neck, feels like her R arm has a tight band on it.
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
## 2024-07-08 NOTE — Progress Notes (Signed)
 Pt arrived on unit from ED. Alert and oriented on arrival. IV flushed, and assessment completed. In no apparent distress. Telemetry initiated. No family currently at the bedside.

## 2024-07-08 NOTE — Progress Notes (Signed)
 As discussed with Dr. Kerrin, patient has right internal jugular thrombus. Per Vascular surgery note, no indication for surgery at this time. Will put on Apixaban (DVT regimen) and allow patient to have a diet.

## 2024-07-08 NOTE — Evaluation (Signed)
 Physical Therapy Evaluation Patient Details Name: Sydney [name] MRN: [id number] DOB: August 29, 1971 Today's Date: 07/08/2024  History of Present Illness  The pt is a 53 yo female presenting 10/4 with progressive RUE pain and numbness. PMH includes: emergent thoracic aneurysm repair 9/15 (admitted 9/15-9/20), tobacco use, chronic back and leg pain, and depression.  Clinical Impression  Pt in bed upon arrival of PT, agreeable to evaluation at this time. Prior to admission the pt was ambulating with use of rollator in her home as she reports decreased endurance for mobility and ADLs/IADLs since surgery on 9/15. 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 The pt does report good support from family and friends as needed. Pt reports 10/10 pain in RUE constantly with greatest pain with ROM of shoulder. Pt with decreased strength compared to LUE, with grip and finger strength being weaker than bipcep/tricep. Pt also reports fully numb between her fingers and with decreased sensation in all fingers. Pt educated in nerve glides and PNF patterns to initiate mobility and ROM in her RUE, but will need continued skilled PT in OP setting to continue rehab efforts and progress both strength and ROM. Pt in agreement with plan, is stable to return home when medically stable for d/c.      If plan is discharge home, recommend the following: Help with stairs or ramp for entrance;Assist for transportation   Can travel by private vehicle        Equipment Recommendations Other (comment) (shower chair)  Recommendations for Other Services       Functional Status Assessment Patient has had a recent decline in their functional status and demonstrates the ability to make significant improvements in function in a reasonable and predictable amount of time.     Precautions / Restrictions Precautions Precautions: None Recall of Precautions/Restrictions: Intact Restrictions Weight Bearing Restrictions  Per Provider Order: No      Mobility  Bed Mobility Overal bed mobility: Independent                  Transfers Overall transfer level: Independent Equipment used: None               General transfer comment: no UE support, BP stable    Ambulation/Gait Ambulation/Gait assistance: Contact guard assist Gait Distance (Feet): 100 Feet Assistive device: None Gait Pattern/deviations: Step-through pattern, Decreased stride length Gait velocity: decreased Gait velocity interpretation: <1.31 ft/sec, indicative of household ambulator   General Gait Details: mild increased sway, no overt LOB. pt reports not walking far at baseline and useing RW for seated rest when fatigued. VSS     Balance Overall balance assessment: Mild deficits observed, not formally tested                                           Pertinent Vitals/Pain Pain Assessment Pain Assessment: Faces Pain Score: 10-Worst pain ever Faces Pain Scale: Hurts even more Pain Location: R arm Pain Descriptors / Indicators: Constant, Sharp, Aching Pain Intervention(s): Limited activity within patient's tolerance, Monitored during session, Premedicated before session (premedicated with dilaudid )    Home Living Family/patient expects to be discharged to:: Private residence Living Arrangements: Other (Comment) Available Help at Discharge: Family;Available PRN/intermittently (3 adult kids come by to check in and help PRN) Type of Home: Apartment Home Access: Stairs to enter Entrance Stairs-Rails: Left Entrance Stairs-Number of Steps: 12  Home Layout: One level Home Equipment: Rollator (4 wheels)      Prior Function Prior Level of Function : Independent/Modified 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 no exercise or activity but moving around the house. ADLs Comments: independent before surgery  on 9/15, now help intermittently from family for IADLs     Extremity/Trunk Assessment   Upper Extremity Assessment Upper Extremity Assessment: Right hand dominant;RUE deficits/detail RUE Deficits / Details: pt reports 10/10 pain despite premedication, worst pain with movement of shoulder. Pt with decreased sensation and numbness/tingling in entire arm, no sensation between fingers and unable to discern which finger I am touching. grossly 3/5 to MMT with finger abd/add and grip, grossly 4-/5 with wrist flexion/extension, 4/5 with bicep/tricep, and 3/5 but pain limited at shoulder. RUE: Unable to fully assess due to pain;Shoulder pain with ROM;Shoulder pain at rest RUE Sensation: decreased light touch RUE Coordination: decreased fine motor;decreased gross motor    Lower Extremity Assessment Lower Extremity Assessment: Overall WFL for tasks assessed    Cervical / Trunk Assessment Cervical / Trunk Assessment: Normal  Communication   Communication Communication: No apparent difficulties    Cognition Arousal: Alert Behavior During Therapy: Restless   PT - Cognitive impairments: No apparent impairments                         Following commands: Intact       Cueing Cueing Techniques: Verbal cues     General Comments General comments (skin integrity, edema, etc.): BP 141/106 (117) sitting EOB, 151/104 (119) after gait. RN aware    Exercises Other Exercises Other Exercises: brachial plexus nerve glides x3 Other Exercises: RUE D1 seated flexion PNF pattern x3   Assessment/Plan    PT Assessment Patient needs continued PT services  PT Problem List Decreased strength;Decreased activity tolerance;Decreased range of motion;Decreased mobility;Pain       PT Treatment Interventions DME instruction;Stair training;Functional mobility training;Therapeutic activities;Gait training;Therapeutic exercise;Balance training;Neuromuscular re-education    PT Goals (Current goals can  be found in the Care Plan section)  Acute Rehab PT Goals Patient Stated Goal: return home and reduce pain in RUE PT Goal Formulation: With patient Time For Goal Achievement: 07/22/24 Potential to Achieve Goals: Good    Frequency Min 2X/week        AM-PAC PT 6 Clicks Mobility  Outcome Measure Help needed turning from your back to your side while in a flat bed without using bedrails?: None Help needed moving from lying on your back to sitting on the side of a flat bed without using bedrails?: None Help needed moving to and from a bed to a chair (including a wheelchair)?: A Little Help needed standing up from a chair using your arms (e.g., wheelchair or bedside chair)?: A Little Help needed to walk in hospital room?: A Little Help needed climbing 3-5 steps with a railing? : A Little 6 Click Score: 20    End of Session Equipment Utilized During Treatment: Gait belt Activity Tolerance: Patient tolerated treatment well;Patient limited by pain Patient left: in bed;with call bell/phone within reach;with nursing/sitter in room Nurse Communication: Mobility status PT Visit Diagnosis: Unsteadiness on feet (R26.81);Muscle weakness (generalized) (M62.81);Pain Pain - Right/Left: Right Pain - part of body: Hand;Shoulder;Arm    Time: 9089-9047 PT Time Calculation (min) (ACUTE ONLY): 42 min   Charges:   PT  Evaluation $PT Eval Low Complexity: 1 Low PT Treatments $Therapeutic Exercise: 8-22 mins $Therapeutic Activity: 8-22 mins PT General Charges $$ ACUTE PT VISIT: 1 Visit         Izetta Call, PT, DPT   Acute Rehabilitation Department Office [phone] Secure Chat Communication Preferred  Izetta JULIANNA Call 07/08/2024, 12:10 PM

## 2024-07-08 NOTE — ED Notes (Signed)
 Assuming pt care, pt walk in for right arm pain x 2 weeks, pt reports having an aneurysm 2 weeks ago. Pt aaox4, restless, skin warm/dry. Med hx. Htn, back surgery. Family at bedside, call bell within reach

## 2024-07-08 NOTE — ED Notes (Signed)
 Breakfast tray ordered

## 2024-07-08 NOTE — ED Notes (Signed)
 ER PA informed of pt request to change pain meds, ER PA at bedside

## 2024-07-08 NOTE — ED Notes (Signed)
 Pt ambulated to restroom without assistance.. food warmed up per patient request
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
## 2024-07-08 NOTE — ED Notes (Signed)
 PT at bedside.

## 2024-07-08 NOTE — ED Notes (Signed)
 Pt refusing IV pain meds requesting oxycodone , private message sent to provider

## 2024-07-08 NOTE — ED Notes (Signed)
 Vascular at bedside

## 2024-07-08 NOTE — ED Notes (Signed)
 Per ER PA, pt requesting to take IV pain meds, requesting another RN to administer medication, other RN asked to administer pain meds to patient.

## 2024-07-08 NOTE — ED Provider Notes (Signed)
 [place] EMERGENCY DEPARTMENT AT [facility] Provider Note   CSN: [id number] Arrival date & time: 07/08/24  9972     Patient presents with: Post-op Problem and Arm Pain   Sydney [name] is a 53 y.o. 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 Lucas.  She returns at this time with progressively worsening right upper extremity pain that starts in the right axilla and radiates down the right arm with progressive numbness in the right hand now resulting the patient being unable to hold objects in the right hand due to the numbness.  Tramadols and oxycodone  at home insufficient for pain control. Per chart review right axillary artery was cannulated for cardiopulmonary bypass at time of surgery.    HPI     Prior to Admission medications   Medication Sig Start Date End Date Taking? Authorizing Provider  amLODipine  (NORVASC ) 10 MG tablet Take 1 tablet (10 mg total) by mouth daily. 06/24/24  Yes Gold, Wayne E, PA-C  Fe Fum-Vit C-Vit B12-FA (TRIGELS-F FORTE) CAPS capsule Take 1 capsule by mouth daily after breakfast. 06/24/24  Yes Gold, Wayne E, PA-C  metoprolol  tartrate (LOPRESSOR ) 50 MG tablet Take 1 tablet (50 mg total) by mouth 2 (two) times daily. 06/24/24  Yes Viviane Lemond BRAVO, PA-C    Allergies: Asa [aspirin]    Review of Systems  Cardiovascular:  Positive for chest pain.  Musculoskeletal:        RUE Weakness, pain    Updated Vital Signs BP (!) 140/106   Pulse 92   Temp 98.7 F (37.1 C) (Oral)   Resp 18   Ht 5' 9 (1.753 m)   Wt 70.4 kg   LMP 04/26/2016   SpO2 100%   BMI 22.92 kg/m   Physical Exam Vitals and nursing note reviewed.  Constitutional:      Appearance: She is not ill-appearing or toxic-appearing.  HENT:     Head: Normocephalic and atraumatic.     Mouth/Throat:     Mouth: Mucous membranes are moist.     Pharynx: No oropharyngeal exudate or posterior oropharyngeal erythema.  Eyes:      General:        Right eye: No discharge.        Left eye: No discharge.     Extraocular Movements: Extraocular movements intact.     Conjunctiva/sclera: Conjunctivae normal.     Pupils: Pupils are equal, round, and reactive to light.  Cardiovascular:     Rate and Rhythm: Normal rate and regular rhythm.     Pulses: Normal pulses.     Heart sounds: Normal heart sounds.  Pulmonary:     Effort: Pulmonary effort is normal. No respiratory distress.     Breath sounds: Normal breath sounds. No wheezing or rales.  Chest:    Abdominal:     General: Bowel sounds are normal. There is no distension.     Palpations: Abdomen is soft.     Tenderness: There is no abdominal tenderness. There is no guarding or rebound.  Musculoskeletal:        General: No deformity.     Cervical back: Neck supple.     Right lower leg: No edema.     Left lower leg: No edema.  Skin:    General: Skin is warm and dry.     Capillary Refill: Capillary refill takes less than 2 seconds.  Neurological:     General: No focal deficit  present.     Mental Status: She is alert and oriented to person, place, and time. Mental status is at baseline.     Sensory: Sensory deficit present.     Motor: Weakness present. No tremor, abnormal muscle tone, seizure activity or pronator drift.     Comments: Patient with symmetric 2+ radial pulses bilaterally, decree sensation in the right hand and wrist, decreased strength in the right compared to the left with 2/5 strength in grip on the right compared to the 5/5 on the left.  Digits pink and well-perfused.  Psychiatric:        Mood and Affect: Mood normal.     (all labs ordered are listed, but only abnormal results are displayed) Labs Reviewed  BASIC METABOLIC PANEL WITH GFR - Abnormal; Notable for the following components:      Result Value   CO2 21 (*)    Glucose, Bld 127 (*)    Creatinine, Ser 1.01 (*)    Calcium 8.8 (*)    All other components within normal limits  CBC -  Abnormal; Notable for the following components:   RBC 3.79 (*)    Hemoglobin 10.4 (*)    HCT 34.6 (*)    Platelets 494 (*)    All other components within normal limits  TROPONIN I (HIGH SENSITIVITY)  TROPONIN I (HIGH SENSITIVITY)    EKG: EKG Interpretation Date/Time:  Saturday July 08 2024 01:07:38 EDT Ventricular Rate:  96 PR Interval:  170 QRS Duration:  82 QT Interval:  358 QTC Calculation: 452 R Axis:   74  Text Interpretation: Normal sinus rhythm Minimal voltage criteria for LVH, may be normal variant ( Sokolow-Lyon ) ST & T wave abnormality, consider inferior ischemia ST & T wave abnormality, consider anterolateral ischemia Abnormal ECG When compared with ECG of 20-Jun-2024 07:05, Inf-lateral T wave inversions are now present (have been present intermittently on previous EKGs) Confirmed by Haze Lonni PARAS [phone]) on 07/08/2024 1:56:17 AM  Radiology: CT Angio Chest/Abd/Pel for Dissection W and/or Wo Contrast Result Date: 07/08/2024 CLINICAL DATA:  On 06/19/2024, patient underwent a Stanford type A thoracic aortic dissection repair using a 32 mm Hemashield platinum vascular graft. She presents with worsening severe right upper extremity pain, numbness and weakness. EXAM: CT ANGIOGRAPHY CHEST, ABDOMEN AND PELVIS TECHNIQUE: Non-contrast CT of the chest was initially obtained. Multidetector CT imaging through the chest, abdomen and pelvis was performed using the standard protocol during bolus administration of intravenous contrast. Multiplanar reconstructed images and MIPs were obtained and reviewed to evaluate the vascular anatomy. RADIATION DOSE REDUCTION: This exam was performed according to the departmental dose-optimization program which includes automated exposure control, adjustment of the mA and/or kV according to patient size and/or use of iterative reconstruction technique. CONTRAST:  OMNIPAQUE  IOHEXOL  350 MG/ML SOLN COMPARISON:  PA Lat chest today, postoperative  portable chest films from 06/22/2024 and 06/21/2024, and preoperative CTA chest, abdomen and pelvis 06/19/2024. 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 There is a small contrast blush to the right anterolateral aspect of the graft/aortic wall on 6:47 concerning for a small leak in the graft. Mediastinal hematoma is again noted and no greater than previously. Its density is less than previously. Two-vessel aortic arch with short-segment brachiobicarotid trunk is again noted. There is good flow in the great vessels and in both of the subclavian and axillary arteries. There are clustered surgical clips about the right axillary  artery. There is a contrast collection just above the proximal axillary artery on 6:12 measuring 1.2 x 0.9 cm consistent with a pseudoaneurysm, new. I do not see an axillary hematoma. This could predispose to thrombus and embolization into the downstream right upper extremity arterial supply. The aorta measures 2.7 cm of the valve, 3.7 cm at the sinuses, 2.8 cm at the sino-tubular junction and 3.2 cm along the graft. The rest is within normal caliber limits with mild atherosclerosis and tortuosity. Mild-to-moderate cardiomegaly. Small pericardial fluid is again noted. No visible coronary calcification. A mediastinal hematoma previously narrowed the pulmonary arteries. There is no longer arterial narrowing. The pulmonary arteries are centrally clear, upper limit of normal caliber. Pulmonary veins are normal. Although not fully included, there is also suspected to be thrombus in the right IJ vein on series 6 images 1-11. Mediastinum/Nodes: Mediastinal hematoma does again surround the aorta and pulmonary arteries but less than previously. No adenopathy. Lungs/Pleura: Mild posterior atelectasis both lower lobes. Mild reticulated apical scarring change. No consolidation, effusion or visible  nodules. Musculoskeletal: Intact median sternotomy sutures. The sternotomy is well apposed. No acute or other significant osseous findings. Review of the MIP images confirms the above findings. CTA ABDOMEN AND PELVIS FINDINGS VASCULAR Aorta: Tortuous with minimal infrarenal atherosclerosis. No aneurysm, stenosis or dissection. Celiac: Widely patent. SMA: Widely patent with a replaced common hepatic artery. Renals: Both are single. Both are normal. 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 IMA: Widely patent. Inflow: Patent without evidence of aneurysm, dissection, vasculitis or significant stenosis. There are mild calcific plaques in both internal iliac arteries. The common iliac and external iliac arteries are plaque free. Veins: No obvious venous abnormality within the limitations of this arterial phase study. Review of the MIP images confirms the above findings. NON-VASCULAR Hepatobiliary: Subtle capsular nodularity over portions, consistent with early cirrhosis. Posteromedially in segment 7 there is a 1 cm cyst, 7 Hounsfield units. No follow-up imaging is recommended. There is no mass. Gallbladder and bile ducts are unremarkable. Pancreas: No abnormality. Spleen: No abnormality. Adrenals/Urinary Tract: 6 mm Bosniak 2 cyst upper pole right kidney, too small to characterize. No follow-up imaging recommended. The adrenal glands and both kidneys are otherwise unremarkable. There is no urinary stone or obstruction. Bladder is unremarkable. Stomach/Bowel: Unremarkable stomach. Normal caliber unopacified small bowel. Normal caliber appendix. Postsurgical change to the proximal stomach. Moderate fecal stasis is again shown. Scattered left colonic diverticula without diverticulitis. Lymphatic: No adenopathy. Reproductive: Status post hysterectomy. No adnexal masses. Multiple pelvic phleboliths. Other: None. Musculoskeletal: Degenerative change lower lumbar spine. Mild DJD SI joints. Review of the MIP  images confirms the above findings. IMPRESSION: 1. Interval synthetic graft repair of the ascending aorta and type A dissection. 2. Small contrast blush to the right anterolateral aspect of the graft/aortic wall on 6:47 concerning for a small leak in the graft. 3. Mediastinal hematoma is again noted and no greater than previously. Its density is less than previously. There is no longer mass effect on the pulmonary arteries and trunk. 4. 1.2 x 0.9 cm contrast collection just above the proximal right axillary artery consistent with a pseudoaneurysm, new. This could predispose to thrombus and embolization into the downstream right upper extremity arterial supply. 5. Although not fully included, there is also suspected to be thrombus in the right IJ vein. 6. Cardiomegaly with small pericardial effusion. 7. Aortic atherosclerosis. 8. Constipation and diverticulosis. 9. CT findings suggest early cirrhosis. 10. Critical Value/emergent results were called by  telephone at the time of interpretation on 07/08/2024 at 4:41 am to provider [facility] , who verbally acknowledged these results. Aortic Atherosclerosis (ICD10-I70.0). Electronically Signed   By: Francis Quam M.D.   On: 07/08/2024 04:41   DG Chest 2 View Result Date: 07/08/2024 EXAM: 2 VIEW(S) XRAY OF THE CHEST 07/08/2024 01:18:09 AM COMPARISON: 06/22/2024 CLINICAL HISTORY: Chest pain/R arm pain. Chest pain/R arm pain FINDINGS: LUNGS AND PLEURA: Trace bilateral pleural effusions. HEART AND MEDIASTINUM: Sternotomy wires noted. BONES AND SOFT TISSUES: Surgical clips overlying right shoulder noted. IMPRESSION: 1. No acute findings. 2. Trace bilateral pleural effusions. Electronically signed by: Dorethia Molt MD 07/08/2024 01:25 AM EDT RP Workstation: [id number]     Procedures   Medications Ordered in the ED  amLODipine  (NORVASC ) tablet 10 mg (has no administration in time range)  metoprolol  tartrate (LOPRESSOR ) tablet 50 mg (has no administration in  time range)  Fe Fum-Vit C-Vit B12-FA (TRIGELS-F FORTE) capsule 1 capsule (has no administration in time range)  acetaminophen  (TYLENOL ) tablet 1,000 mg (has no administration in time range)  pregabalin (LYRICA) capsule 25 mg (has no administration in time range)  HYDROmorphone  (DILAUDID ) tablet 2 mg (has no administration in time range)  methocarbamol  (ROBAXIN ) tablet 500 mg (has no administration in time range)  polyethylene glycol (MIRALAX / GLYCOLAX) packet 17 g (has no administration in time range)  zolpidem (AMBIEN) tablet 5 mg (has no administration in time range)  HYDROmorphone  (DILAUDID ) injection 1 mg (has no administration in time range)  fentaNYL  (SUBLIMAZE ) injection 50 mcg (50 mcg Intravenous Given 07/08/24 0252)  iohexol  (OMNIPAQUE ) 350 MG/ML injection 100 mL (100 mLs Intravenous Contrast Given 07/08/24 0321)  HYDROmorphone  (DILAUDID ) injection 0.5 mg (0.5 mg Intravenous Given 07/08/24 0438)  HYDROmorphone  (DILAUDID ) injection 0.5 mg (0.5 mg Intravenous Given 07/08/24 0612)    Clinical Course as of 07/08/24 0644  Sat Jul 08, 2024  0438 Dr. Quam, [place] [facility], with critical result regarding this CTA. 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 Contrast blush in the R anterior chest concerning for graft leak.  [RS]  [phone] Consult to Dr. Kerrin, his cardiothoracic surgery, who is agreeable to seeing this patient in the emergency department.  I appreciate his collaboration of care this patient. [RS]  [id number] Consult call received from Dr. Kerrin, CT surgery who feels that the patient's CT scan is inconsistent with a graft leak rather than postoperative changes on CT.  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  He also will consult surgery for evaluation of the axillary arterial findings on CT.  I  appreciate his collaboration in the care of the patient. [RS]    Clinical Course User Index [RS] Bobette Pleasant JONELLE DEVONNA                                 Medical Decision Making 53 year old female with progressively worsening right upper extremity pain since thoracic aortic dissection repair on 06/19/2024.  Hypertensive on intake, vital signs otherwise normal.  Cardiopulmonary exam unremarkable, abdominal exam is benign.  Patient is neurologically and vascularly intact in the right upper extremity.  Differential diagnose includes but is not limited to vascular injury from surgery, nerve injury from positioning during surgery, critical limb ischemia, trigger point/musculoskeletal pain.  Amount and/or Complexity of Data Reviewed  Labs: ordered.    Details:  CBC with baseline anemia, BMP unremarkable, troponin negative.  Radiology: ordered.    Details:  CT dissection study with question of possible graft leak of the aortic dissection site, new pseudoaneurysm of the right axillary artery and, new right IJ vein thrombus.  Risk Prescription drug management. Decision regarding hospitalization.   Consult to cardiothoracic surgery as above, patient admitted to their service pending neuro consultation in the inpatient setting.  Sydney [name] voiced understanding of her medical evaluation and treatment plan. Each of their questions answered to their expressed satisfaction. She is amenable to plan for admission at this time   This chart was dictated using voice recognition software, Dragon. Despite the best efforts of this provider to proofread and correct errors, errors may still occur which can change documentation meaning.      Final diagnoses:  None    ED Discharge Orders     None          Bobette Pleasant SAUNDERS, PA-C 07/08/24 [id number]    Haze Lonni PARAS, MD 07/09/24 [phone]

## 2024-07-09 DIAGNOSIS — D649 Anemia, unspecified: Secondary | ICD-10-CM

## 2024-07-09 DIAGNOSIS — G540 Brachial plexus disorders: Secondary | ICD-10-CM | POA: Diagnosis not present

## 2024-07-09 DIAGNOSIS — I82C11 Acute embolism and thrombosis of right internal jugular vein: Secondary | ICD-10-CM | POA: Diagnosis not present

## 2024-07-09 MED ORDER — OXYCODONE HCL 5 MG PO TABS: 5.0000 mg | ORAL_TABLET | ORAL | Status: DC | PRN

## 2024-07-09 MED ORDER — APIXABAN 5 MG PO TABS: ORAL_TABLET | ORAL | 1 refills | Status: DC

## 2024-07-09 MED ORDER — METHOCARBAMOL 500 MG PO TABS: 500.0000 mg | ORAL_TABLET | Freq: Three times a day (TID) | ORAL | 0 refills | Status: DC | PRN

## 2024-07-09 MED ORDER — PREGABALIN 25 MG PO CAPS: 25.0000 mg | ORAL_CAPSULE | Freq: Two times a day (BID) | ORAL | 1 refills | Status: DC

## 2024-07-09 MED ORDER — OXYCODONE HCL 5 MG PO TABS: 5.0000 mg | ORAL_TABLET | ORAL | 0 refills | Status: AC | PRN

## 2024-07-09 NOTE — Discharge Summary (Signed)
 [address] [place] [address]             [phone]        Physician Discharge Summary  Patient ID: Sydney [name] MRN: [id number] DOB/AGE: [birth year] 53 y.o.  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)    Anemia, unspecified 05/14/2016   Thyroid  goiter 05/14/2016   Thyroid  nodule 05/14/2016   Constipation 05/14/2016   Wart 05/14/2016   Chronic fatigue 05/14/2016   S/P hysterectomy with oophorectomy 04/30/2016   Lumbar post-laminectomy syndrome 10/23/2015   Lumbar radicular pain 10/23/2015   Essential hypertension 07/12/2015   BPPV (benign paroxysmal positional vertigo) 07/12/2015     Discharge Diagnoses:  Patient Active Problem List   Diagnosis Date Noted   Brachial plexopathy 07/08/2024   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 06/20/2024   S/P ascending aortic replacement 06/20/2024   Aortic dissection (HCC) 06/19/2024   Acute lower GI bleeding 12/01/2021   GI bleed 06/27/2020   Syncope 06/26/2020   Orthostatic hypotension    Vagina itching    Confusion    Lightheadedness    Rectal bleeding    Acute GI bleeding 08/24/2018   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)    Anemia, unspecified 05/14/2016   Thyroid  goiter 05/14/2016   Thyroid  nodule 05/14/2016   Constipation 05/14/2016   Wart 05/14/2016   Chronic fatigue 05/14/2016    S/P hysterectomy with oophorectomy 04/30/2016   Lumbar post-laminectomy syndrome 10/23/2015   Lumbar radicular pain 10/23/2015   Essential hypertension 07/12/2015   BPPV (benign paroxysmal positional vertigo) 07/12/2015  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   Presented with chest pain on 06/19/2024.  CT showed a type I aortic dissection.  Dr. Lucas did a hemiarch repair under deep hypothermic circulatory arrest with a supra coronary proximal anastomosis.  From review of the records her postoperative course was uncomplicated and she went home on postoperative day #5.   She says that she was having problems with right arm pain and numbness even before the surgery but they were worse afterwards.  Office note from 06/29/2024 indicates that she was having difficulty with right arm pain and numbness and had been dropping things.  Tramadol  was ineffective and she was switched to oxycodone .  Last night pain became even more severe and she came to the emergency room.  A CT angiogram was performed.   The patient remains in pain.  Numbness persist.  Not aware of any swelling in the arm. She presents now with right arm pain and numbness.  These findings are  not new and in fact have been present at least since the surgery if not before.  Symptoms did worsen overnight.  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   Dr. Kerrin  reviewed her CT.  The contrast blush of the graft is consistent with pledgets from a deairing site.  There is no issue with either anastomosis.  The right axillary pseudoaneurysm is consistent with the small stump of the graft that is still present after  ligation.  There is no significant axillary hematoma.  Her hand is well-perfused. Vascular surgery was consulted and she does not surgical intervention.   Hospital Course: She was given Lyrica, Robaxin  PRN, and Dilaudid . Dilaudid  did not help much and she requested Oxycodone . . Her BP remained well controlled on Lopressor  and Amlodipine . Her anemia has improved since surgery/discharge. PT saw and evaluated her. Shower chair was recommended and ordered. PT/OT will be arranged as an outpatient with follow up next with [facility]. Her pain was better controlled and she was stable for discharge.  Consults: Vascular Surgery   Narrative & Impression  EXAM: 2 VIEW(S) XRAY OF THE CHEST 07/08/2024 01:18:09 AM   COMPARISON: 06/22/2024   CLINICAL HISTORY: Chest pain/R arm pain. Chest pain/R arm pain   FINDINGS:   LUNGS AND PLEURA: Trace bilateral pleural effusions.   HEART AND MEDIASTINUM: Sternotomy wires noted.   BONES AND SOFT TISSUES: Surgical clips overlying right shoulder noted.   IMPRESSION: 1. No acute findings. 2. Trace bilateral pleural effusions.   Electronically signed by: Dorethia Molt MD 07/08/2024 01:25 AM EDT RP Workstation: [id number]    Significant Diagnostic Studies:    Narrative & Impression  CLINICAL DATA:  On 06/19/2024, patient underwent a Stanford type A thoracic aortic dissection repair using a 32 mm Hemashield platinum vascular graft. She presents with worsening severe right upper extremity pain, numbness and weakness.   EXAM: CT ANGIOGRAPHY CHEST, ABDOMEN AND PELVIS   TECHNIQUE: Non-contrast CT of the chest was initially obtained.   Multidetector CT imaging through the chest, abdomen and pelvis was performed using the standard protocol during bolus administration of intravenous contrast. Multiplanar reconstructed images and MIPs were obtained and reviewed to evaluate the vascular anatomy.   RADIATION DOSE REDUCTION: This exam was performed according  to the departmental dose-optimization program which includes automated exposure control, adjustment of the mA and/or kV according to patient size and/or use of iterative reconstruction technique.   CONTRAST:  OMNIPAQUE  IOHEXOL  350 MG/ML SOLN   COMPARISON:  PA Lat chest today, postoperative portable chest films from 06/22/2024 and 06/21/2024, and preoperative CTA chest, abdomen and pelvis 06/19/2024.   FINDINGS: CTA CHEST FINDINGS   Cardiovascular: The prior ascending aortic aneurysm and type a dissection of undergone interval synthetic graft repair.   There is a small contrast blush to the right anterolateral aspect of the graft/aortic wall on 6:47 concerning for a small leak in the graft.   Mediastinal hematoma is again noted and no greater than previously. Its density is less than previously.   Two-vessel aortic arch with short-segment brachiobicarotid trunk is again noted. There is good flow in the great vessels and in both of the subclavian and axillary arteries.   There are clustered surgical clips about the right axillary artery. There is a contrast collection just above the proximal axillary artery on 6:12 measuring 1.2 x 0.9 cm consistent with a pseudoaneurysm, new.   I do not see an axillary hematoma. This could predispose to thrombus and embolization into the downstream right upper extremity  arterial supply.   The aorta measures 2.7 cm of the valve, 3.7 cm at the sinuses, 2.8 cm at the sino-tubular junction and 3.2 cm along the graft.   The rest is within normal caliber limits with mild atherosclerosis and tortuosity.   Mild-to-moderate cardiomegaly. Small pericardial fluid is again noted. No visible coronary calcification.   A mediastinal hematoma previously narrowed the pulmonary arteries. There is no longer arterial narrowing.   The pulmonary arteries are centrally clear, upper limit of normal caliber. Pulmonary veins are normal.   Although not  fully included, there is also suspected to be thrombus in the right IJ vein on series 6 images 1-11.   Mediastinum/Nodes: Mediastinal hematoma does again surround the aorta and pulmonary arteries but less than previously. No adenopathy.   Lungs/Pleura: Mild posterior atelectasis both lower lobes. Mild reticulated apical scarring change. No consolidation, effusion or visible nodules.   Musculoskeletal: Intact median sternotomy sutures. The sternotomy is well apposed. No acute or other significant osseous findings.   Review of the MIP images confirms the above findings.   CTA ABDOMEN AND PELVIS FINDINGS   VASCULAR   Aorta: Tortuous with minimal infrarenal atherosclerosis. No aneurysm, stenosis or dissection.   Celiac: Widely patent.   SMA: Widely patent with a replaced common hepatic artery.   Renals: Both are single. Both are normal. 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   IMA: Widely patent.   Inflow: Patent without evidence of aneurysm, dissection, vasculitis or significant stenosis. There are mild calcific plaques in both internal iliac arteries. The common iliac and external iliac arteries are plaque free.   Veins: No obvious venous abnormality within the limitations of this arterial phase study.   Review of the MIP images confirms the above findings.   NON-VASCULAR   Hepatobiliary: Subtle capsular nodularity over portions, consistent with early cirrhosis. Posteromedially in segment 7 there is a 1 cm cyst, 7 Hounsfield units. No follow-up imaging is recommended. There is no mass. Gallbladder and bile ducts are unremarkable.   Pancreas: No abnormality.   Spleen: No abnormality.   Adrenals/Urinary Tract: 6 mm Bosniak 2 cyst upper pole right kidney, too small to characterize. No follow-up imaging recommended.   The adrenal glands and both kidneys are otherwise unremarkable. There is no urinary stone or obstruction. Bladder is  unremarkable.   Stomach/Bowel: Unremarkable stomach. Normal caliber unopacified small bowel. Normal caliber appendix. Postsurgical change to the proximal stomach.   Moderate fecal stasis is again shown. Scattered left colonic diverticula without diverticulitis.   Lymphatic: No adenopathy.   Reproductive: Status post hysterectomy. No adnexal masses. Multiple pelvic phleboliths.   Other: None.   Musculoskeletal: Degenerative change lower lumbar spine. Mild DJD SI joints.   Review of the MIP images confirms the above findings.   IMPRESSION: 1. Interval synthetic graft repair of the ascending aorta and type A dissection. 2. Small contrast blush to the right anterolateral aspect of the graft/aortic wall on 6:47 concerning for a small leak in the graft. 3. Mediastinal hematoma is again noted and no greater than previously. Its density is less than previously. There is no longer mass effect on the pulmonary arteries and trunk. 4. 1.2 x 0.9 cm contrast collection just above the proximal right axillary artery consistent with a pseudoaneurysm, new. This could predispose to thrombus and embolization into the downstream right upper extremity arterial supply. 5. Although not fully included, there is also suspected to be thrombus in the right IJ vein. 6. Cardiomegaly  with small pericardial effusion. 7. Aortic atherosclerosis. 8. Constipation and diverticulosis. 9. CT findings suggest early cirrhosis. 10. Critical Value/emergent results were called by telephone at the time of interpretation on 07/08/2024 at 4:41 am to provider [facility] , who verbally acknowledged these results.   Aortic Atherosclerosis (ICD10-I70.0).     Electronically Signed   By: Francis Quam M.D.   On: 07/08/2024 04:41     Discharge Exam: Blood pressure 111/86, pulse 88, temperature 98.6 F (37 C), temperature source Oral, resp. rate 19, height 5' 9 (1.753 m), weight 70.4 kg, last menstrual  period 04/26/2016, SpO2 98%. Cardiovascular: RRR Pulmonary: Clear to auscultation bilaterally Abdomen: Soft, non tender, bowel sounds present. Extremities: No extremity edema. Numbness right hand (mostly digits 3-5) and right hand weakness. Right hand is warm Wounds: Clean and dry.  No erythema or signs of infection.   Discharge Medications:  Allergies as of 07/09/2024       Reactions   Asa [aspirin] Other (See Comments)   Hx GI bleed        Medication List     STOP taking these medications    Fe Fum-Vit C-Vit B12-FA Caps capsule Commonly known as: TRIGELS-F FORTE       TAKE these medications    acetaminophen  500 MG tablet Commonly known as: TYLENOL  Take 1,000 mg by mouth 2 (two) times daily as needed (pain).   amLODipine  10 MG tablet Commonly known as: NORVASC  Take 1 tablet (10 mg total) by mouth daily.   apixaban 5 MG Tabs tablet Commonly known as: ELIQUIS Take 2 tablets (10 mg total) by mouth 2 (two) times daily for 5 days, THEN 1 tablet (5 mg total) 2 (two) times daily. Start taking on: July 09, 2024   methocarbamol  500 MG tablet Commonly known as: ROBAXIN  Take 1 tablet (500 mg total) by mouth every 8 (eight) hours as needed for muscle spasms.   metoprolol  tartrate 50 MG tablet Commonly known as: LOPRESSOR  Take 1 tablet (50 mg total) by mouth 2 (two) times daily.   Multivitamin Women Tabs Take 1 tablet by mouth daily.   oxyCODONE  5 MG immediate release tablet Commonly known as: Oxy IR/ROXICODONE  Take 1-2 tablets (5-10 mg total) by mouth every 4 (four) hours as needed for up to 7 days for moderate pain (pain score 4-6).   pregabalin 25 MG capsule Commonly known as: LYRICA Take 1 capsule (25 mg total) by mouth 2 (two) times daily.               Durable Medical Equipment  (From admission, onward)           Start     Ordered   07/09/24 0832  For home use only DME Shower stool  Once        07/09/24 9167              Signed:  Kyla CHRISTELLA Donald, PA-C 07/09/2024, 9:41 AM

## 2024-07-09 NOTE — Progress Notes (Signed)
 Discharge   Pt verbally understands discharge POC.  Extensive education and additional education included with AVS.  CCMD/Gail.  Pressure dressing to PIV site.  Discharging to Main A.

## 2024-07-09 NOTE — Progress Notes (Addendum)
                  [address]           [place], [place] [address]                     [phone]            Subjective: Patient eating breakfast this am. She states Lyrica is helping and stated Dilaudid  not doing much.  Objective: Vital signs in last 24 hours: Temp:  [98 F (36.7 C)-99.2 F (37.3 C)] 98.6 F (37 C) (10/05 0738) Pulse Rate:  [75-93] 88 (10/05 0738) Cardiac Rhythm: Normal sinus rhythm (10/04 1910) Resp:  [14-20] 19 (10/05 0738) BP: (104-151)/(76-104) 111/86 (10/05 0738) SpO2:  [96 %-100 %] 98 % (10/05 0738)   Current Weight  07/08/24 70.4 kg      Intake/Output from previous day: No intake/output data recorded.   Physical Exam:  Cardiovascular: RRR Pulmonary: Clear to auscultation bilaterally Abdomen: Soft, non tender, bowel sounds present. Extremities: No extremity edema. Numbness right hand (mostly digits 3-5) and right hand weakness. Right hand is warm Wounds: Clean and dry.  No erythema or signs of infection.  Lab Results: CBC: Recent Labs    07/08/24 0106  WBC 7.9  HGB 10.4*  HCT 34.6*  PLT 494*   BMET:  Recent Labs    07/08/24 0106  NA 138  K 3.7  CL 105  CO2 21*  GLUCOSE 127*  BUN 16  CREATININE 1.01*  CALCIUM 8.8*    PT/INR:  Lab Results  Component Value Date   INR 1.3 (H) 06/19/2024   INR 1.0 12/01/2021   INR 1.0 06/26/2020   ABG:  INR: Will add last result for INR, ABG once components are confirmed Will add last 4 CBG results once components are confirmed  Assessment/Plan:  1. CV - SR. On Amlodipine  10 mg daily and Lopressor  50 mg bid. 2.  Pulmonary - On room air. 3. Right internal jugular thrombus-on Apixaban 10 mg bid for 7 days followed by 5 mg bid 4. Anemia-H and H yesterday improved to 10.4 and 34.6. On Trigels 5. Right arm pain with brachial plexus neuropraxia-evaluated by Vascular surgery yesterday. No need for surgery. On Lyrica, Robaxin  PRN, and Dilaudid . Per patient, she  request Oxy and not Dilaudid  so will change. 6. Disposition-discharge. Will arrange for outpatient PT/OT and follow up next week  Donielle M ZimmermanPA-C 8:24 AM Patient seen and examined, pain and ROM much improved Will dc today F/u in office later this week Outpatient PT/OT  Elspeth C. Kerrin, MD [facility] [phone]

## 2024-07-09 NOTE — Plan of Care (Signed)

## 2024-07-09 NOTE — Discharge Instructions (Addendum)
 Information on my medicine - ELIQUIS (apixaban)  This medication education was reviewed with me or my healthcare representative as part of my discharge preparation.     Why was Eliquis prescribed for you? Eliquis was prescribed to treat blood clots that may have been found in the veins of your legs (deep vein thrombosis) or in your lungs (pulmonary embolism) and to reduce the risk of them occurring again.  What do You need to know about Eliquis ? The starting dose is 10 mg (two 5 mg tablets) taken TWICE daily for the FIRST SEVEN (7) DAYS, then on 07/15/24  the dose is reduced to ONE 5 mg tablet taken TWICE daily.  Eliquis may be taken with or without food.   Try to take the dose about the same time in the morning and in the evening. If you have difficulty swallowing the tablet whole please discuss with your pharmacist how to take the medication safely.  Take Eliquis exactly as prescribed and DO NOT stop taking Eliquis without talking to the doctor who prescribed the medication.  Stopping may increase your risk of developing a new blood clot.  Refill your prescription before you run out.  After discharge, you should have regular check-up appointments with your healthcare provider that is prescribing your Eliquis.    What do you do if you miss a dose? If a dose of ELIQUIS is not taken at the scheduled time, take it as soon as possible on the same day and twice-daily administration should be resumed. The dose should not be doubled to make up for a missed dose.  Important Safety Information A possible side effect of Eliquis is bleeding. You should call your healthcare provider right away if you experience any of the following: Bleeding from an injury or your nose that does not stop. Unusual colored urine (red or dark brown) or unusual colored stools (red or black). Unusual bruising for unknown reasons. A serious fall or if you hit your head (even if there is no bleeding).  Some  medicines may interact with Eliquis and might increase your risk of bleeding or clotting while on Eliquis. To help avoid this, consult your healthcare provider or pharmacist prior to using any new prescription or non-prescription medications, including herbals, vitamins, non-steroidal anti-inflammatory drugs (NSAIDs) and supplements.  This website has more information on Eliquis (apixaban): http://www.eliquis.com/eliquis/home   ====================================  Deep Vein Thrombosis    Deep vein thrombosis (DVT) is a condition in which a blood clot forms in a deep vein, such as a lower leg, thigh, or arm vein. A clot is blood that has thickened into a gel or solid. This condition is dangerous. It can lead to serious and even life-threatening complications if the clot travels to the lungs and causes a blockage (pulmonary embolism). It can also damage veins in the leg. This can result in leg pain, swelling, discoloration, and sores (post-thrombotic syndrome).  What are the causes? This condition may be caused by: A slowdown of blood flow. Damage to a vein. A condition that causes blood to clot more easily, such as an inherited clotting disorder.  What increases the risk? The following factors may make you more likely to develop this condition: Being overweight. Being older, especially over age 1. Sitting or lying down for more than four hours. Being in the hospital. Lack of physical activity (sedentary lifestyle). Pregnancy, being in childbirth, or having recently given birth. Taking medicines that contain estrogen, such as medicines to prevent pregnancy. Smoking. A history  of any of the following: Blood clots or a blood clotting disease. Peripheral vascular disease. Inflammatory bowel disease. Cancer. Heart disease. Genetic conditions that affect how your blood clots, such as Factor V Leiden mutation. Neurological diseases that affect your legs (leg paresis). A recent injury,  such as a car accident. Major or lengthy surgery. A central line placed inside a large vein.  What are the signs or symptoms? Symptoms of this condition include: Swelling, pain, or tenderness in an arm or leg. Warmth, redness, or discoloration in an arm or leg. If the clot is in your leg, symptoms may be more noticeable or worse when you stand or walk. Some people may not develop any symptoms.  How is this diagnosed? This condition is diagnosed with: A medical history and physical exam. Tests, such as: Blood tests. These are done to check how well your blood clots. Ultrasound. This is done to check for clots. Venogram. For this test, contrast dye is injected into a vein and X-rays are taken to check for any clots  How is this treated? Treatment for this condition depends on: The cause of your DVT. Your risk for bleeding or developing more clots. Any other medical conditions that you have. Treatment may include: Taking a blood thinner (anticoagulant). This type of medicine prevents clots from forming. It may be taken by mouth, injected under the skin, or injected through an IV (catheter). Injecting clot-dissolving medicines into the affected vein (catheter-directed thrombolysis). Having surgery. Surgery may be done to: Remove the clot. Place a filter in a large vein to catch blood clots before they reach the lungs. Some treatments may be continued for up to six months.  Follow these instructions at home: If you are taking blood thinners: Take the medicine exactly as told by your health care provider. Some blood thinners need to be taken at the same time every day. Do not skip a dose. Talk with your health care provider before you take any medicines that contain aspirin or NSAIDs. These medicines increase your risk for dangerous bleeding. Ask your health care provider about foods and drugs that could change the way the medicine works (may interact). Avoid those things if your health  care provider tells you to do so. Blood thinners can cause easy bruising and may make it difficult to stop bleeding. Because of this: Be very careful when using knives, scissors, or other sharp objects. Use an electric razor instead of a blade. Avoid activities that could cause injury or bruising, and follow instructions about how to prevent falls. Wear a medical alert bracelet or carry a card that lists what medicines you take.  General instructions Take over-the-counter and prescription medicines only as told by your health care provider. Return to your normal activities as told by your health care provider. Ask your health care provider what activities are safe for you. Wear compression stockings if recommended by your health care provider. Keep all follow-up visits as told by your health care provider. This is important.  How is this prevented? To lower your risk of developing this condition again: For 30 or more minutes every day, do an activity that: Involves moving your arms and legs. Increases your heart rate. When traveling for longer than four hours: Exercise your arms and legs every hour. Drink plenty of water. Avoid drinking alcohol. Avoid sitting or lying for a long time without moving your legs. If you have surgery or you are hospitalized, ask about ways to prevent blood clots. These may  include taking frequent walks or using anticoagulants. Stay at a healthy weight. If you are a woman who is older than age 15, avoid unnecessary use of medicines that contain estrogen, such as some birth control pills. Do not use any products that contain nicotine  or tobacco, such as cigarettes and e-cigarettes. This is especially important if you take estrogen medicines. If you need help quitting, ask your health care provider.  Contact a health care provider if: You miss a dose of your blood thinner. Your menstrual period is heavier than usual. You have unusual bruising.  Get help  right away if: You have: New or increased pain, swelling, or redness in an arm or leg. Numbness or tingling in an arm or leg. Shortness of breath. Chest pain. A rapid or irregular heartbeat. A severe headache or confusion. A cut that will not stop bleeding. There is blood in your vomit, stool, or urine. You have a serious fall or accident, or you hit your head. You feel light-headed or dizzy. You cough up blood.  These symptoms may represent a serious problem that is an emergency. Do not wait to see if the symptoms will go away. Get medical help right away. Call your local emergency services (911 in the [place] Do not drive yourself to the hospital. Summary Deep vein thrombosis (DVT) is a condition in which a blood clot forms in a deep vein, such as a lower leg, thigh, or arm vein. Symptoms can include swelling, warmth, pain, and redness in your leg or arm. This condition may be treated with a blood thinner (anticoagulant medicine), medicine that is injected to dissolve blood clots,compression stockings, or surgery. If you are prescribed blood thinners, take them exactly as told. This information is not intended to replace advice given to you by your health care provider. Make sure you discuss any questions you have with your health care provider. Document Revised: 09/03/2017 Document Reviewed: 02/19/2017 Elsevier Patient Education  2020 [employer].

## 2024-07-09 NOTE — TOC Transition Note (Signed)
 Transition of Care [facility]) - Discharge Note   Patient Details  Name: Sydney [name] MRN: [id number] Date of Birth: 12/05/1970  [facility]) CM/SW Contact:  Corean JAYSON Canary, RN Phone Number: 07/09/2024, 11:38 AM   Clinical Narrative:    Patient discharging today. Shower stool ordered from [employer] they will deliver to house. No further needs identified    Final next level of care: Home/Self Care     Patient Goals and CMS Choice            Discharge Placement                       Discharge Plan and Services Additional resources added to the After Visit Summary for                  DME Arranged: Shower stool DME Agency: [employer] Date DME Agency Contacted: 07/09/24 Time DME Agency Contacted: [phone] Representative spoke with at DME Agency: [place]            Social Drivers of Health (SDOH) Interventions SDOH Screenings   Food Insecurity: No Food Insecurity (07/08/2024)  Recent Concern: Food Insecurity - Food Insecurity Present (07/08/2024)  Housing: Low Risk  (07/08/2024)  Recent Concern: Housing - High Risk (07/08/2024)  Transportation Needs: No Transportation Needs (07/08/2024)  Utilities: Not At Risk (07/08/2024)  Tobacco Use: High Risk (07/08/2024)     Readmission Risk Interventions     No data to display

## 2024-07-12 ENCOUNTER — Ambulatory Visit

## 2024-07-12 DIAGNOSIS — I7101 Dissection of ascending aorta: Secondary | ICD-10-CM

## 2024-07-12 DIAGNOSIS — I10 Essential (primary) hypertension: Secondary | ICD-10-CM

## 2024-07-12 DIAGNOSIS — Z720 Tobacco use: Secondary | ICD-10-CM
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
## 2024-07-13 ENCOUNTER — Ambulatory Visit: Payer: Self-pay

## 2024-07-13 VITALS — BP 131/86 | HR 75 | Resp 20 | Ht 69.0 in | Wt 157.3 lb

## 2024-07-13 DIAGNOSIS — G540 Brachial plexus disorders: Secondary | ICD-10-CM

## 2024-07-13 DIAGNOSIS — Z95828 Presence of other vascular implants and grafts: Secondary | ICD-10-CM

## 2024-07-13 DIAGNOSIS — Z8679 Personal history of other diseases of the circulatory system: Secondary | ICD-10-CM

## 2024-07-13 DIAGNOSIS — Z9889 Other specified postprocedural states: Secondary | ICD-10-CM

## 2024-07-13 NOTE — Progress Notes (Addendum)
 [address] [place] [address]             [phone]       HPI:  Sydney [name] is a 53 year-old woman with a past medical history significant for tobacco use, hypertension, GIST tumor of the stomach, chronic back and leg pain, depression, thyromegaly who returns for routine postoperative follow-up having undergone Replacement of the ascending aorta (hemi-arch) using a 32 mm Hemashield graft for a type I aortic dissection with axillary cannulation  on 06/19/2024 with Dr. Lucas.  The patient's early postoperative recovery while in the hospital was uncomplicated and she was able to be discharged on post op day 5 in stable condition.   Since hospital discharge she readmitted for postoperative pain and brachial plexopathy. She was evaluated by vascular and surgery was not required. She was started on lyrica and methocarbamol  for pain.  She was to continue with oxycodone .  She was also found to have a right internal jugular thrombus and was started on eliquis.   She presents to the clinic today for medication check and follow-up.  She has been taking oxycodone  every 4 hours.  She has sometimes been needing to take 2 especially before bedtime.  She has been using Tylenol  for breakthrough pain.  She has been taking the Lyrica twice a day and finds that this has been the most helpful in reducing the numbness in her hand.  She has been taking the methocarbamol  every 8 hours as prescribed.  She is taking eliquis as prescribed and understands when to lower the dose.   She states that none of the prescribed medications have lessened her pain in her right arm.  She denies incisional and chest pain.  She states that she has tried taking a higher dosage of oxycodone  and admits to using medications that were not prescribed to her.  The only medication that helped with her pain was the pink pills of oxycodone .  This was only a couple times and reports that she is now only using  the medications prescribed to her.   She has PT and OT referrals placed and they have not reached out.  Order has been placed for shower chair and rolling walker.    Allergies as of 07/13/2024       Reactions   Asa [aspirin] Other (See Comments)   Hx GI bleed        Medication List        Accurate as of July 13, 2024  1:03 PM. If you have any questions, ask your nurse or doctor.          acetaminophen  500 MG tablet Commonly known as: TYLENOL  Take 1,000 mg by mouth 2 (two) times daily as needed (pain).   amLODipine  10 MG tablet Commonly known as: NORVASC  Take 1 tablet (10 mg total) by mouth daily.   apixaban 5 MG Tabs tablet Commonly known as: ELIQUIS Take 2 tablets (10 mg total) by mouth 2 (two) times daily for 5 days, THEN 1 tablet (5 mg total) 2 (two) times daily. Start taking on: July 09, 2024   methocarbamol  500 MG tablet Commonly known as: ROBAXIN  Take 1 tablet (500 mg total) by mouth every 8 (eight) hours as needed for muscle spasms.   metoprolol  tartrate 50 MG tablet Commonly known as: LOPRESSOR  Take 1 tablet (50 mg total) by mouth 2 (two) times daily.   Multivitamin Women Tabs Take 1 tablet  by mouth daily.   oxyCODONE  5 MG immediate release tablet Commonly known as: Oxy IR/ROXICODONE  Take 1-2 tablets (5-10 mg total) by mouth every 4 (four) hours as needed for up to 7 days for moderate pain (pain score 4-6).   pregabalin 25 MG capsule Commonly known as: LYRICA Take 1 capsule (25 mg total) by mouth 2 (two) times daily. What changed: when to take this         ROS Review of Systems  Respiratory:  Negative for cough and shortness of breath.   Cardiovascular:  Negative for chest pain and leg swelling.  Musculoskeletal:        Pain in right arm, numbness in ring finger and pinky finger right hand  Neurological: Negative.  Negative for headaches.      BP 131/86 (BP Location: Left Arm, Patient Position: Sitting, Cuff Size: Normal)   Pulse 75    Resp 20   Ht 5' 9 (1.753 m)   Wt 157 lb 4.8 oz (71.4 kg)   LMP 04/26/2016   SpO2 96% Comment: RA  BMI 23.23 kg/m   Physical Exam Constitutional:      Appearance: Normal appearance.  HENT:     Head: Normocephalic and atraumatic.  Cardiovascular:     Rate and Rhythm: Normal rate and regular rhythm.     Heart sounds: Normal heart sounds, S1 normal and S2 normal.  Pulmonary:     Effort: Pulmonary effort is normal.     Breath sounds: Normal breath sounds.  Musculoskeletal:     Cervical back: Normal range of motion.  Skin:    General: Skin is warm and dry.      Neurological:     General: No focal deficit present.     Mental Status: She is alert.     Comments: Numbness along fourth and fifth digits of right hand. Pain along upper right arm and axilla      Imaging: N/A   Assessment/Plan:  S/P ascending aortic replacement -Reviewed medications at this time.  She is to continue with oxycodone  as prescribed. She is not to use any oxycodone  that is not prescribed for her.  -She is to continue with methocarbamol  and tylenol .   -Discussed that if medications as prescribed are not helping with pain she may need to be seen by pain clinic. Went over the importance of only taking medications prescribed to patient -Continue with eliquis 10 mg BID until 10/10. Then 5 mg BID  -Continue with amlodipine  and metoprolol  as prescribed -Discussed that she should continue to be active and increase activity as tolerated.  Continue with sternal precautions -Follow up scheduled with [facility] on 07/19/2024  Brachial plexopathy -Increased lyrica to 25 mg three times daily. She was taking 25 mg BID -PT/OT referral placed  -DME order placed for rolling walker and shower chair  Sydney CHRISTELLA Rough, PA-C 1:03 PM 07/13/24

## 2024-07-13 NOTE — Patient Instructions (Addendum)
-  Take Lyrica 25 mg three times a day.  -Follow up with [facility] on 07/19/2024

## 2024-07-14 ENCOUNTER — Ambulatory Visit

## 2024-07-14 VITALS — BP 120/80 | HR 72 | Ht 69.0 in | Wt 157.0 lb

## 2024-07-14 DIAGNOSIS — Z720 Tobacco use: Secondary | ICD-10-CM | POA: Diagnosis present

## 2024-07-14 DIAGNOSIS — I10 Essential (primary) hypertension: Secondary | ICD-10-CM

## 2024-07-14 DIAGNOSIS — R079 Chest pain, unspecified: Secondary | ICD-10-CM | POA: Diagnosis present

## 2024-07-14 DIAGNOSIS — R200 Anesthesia of skin: Secondary | ICD-10-CM

## 2024-07-14 DIAGNOSIS — I7101 Dissection of ascending aorta: Secondary | ICD-10-CM

## 2024-07-14 DIAGNOSIS — Z95828 Presence of other vascular implants and grafts: Secondary | ICD-10-CM | POA: Diagnosis present

## 2024-07-14 MED ORDER — METOPROLOL SUCCINATE ER 100 MG PO TB24: 100.0000 mg | ORAL_TABLET | Freq: Every day | ORAL | 3 refills | Status: DC

## 2024-07-14 MED ORDER — LOSARTAN POTASSIUM 50 MG PO TABS: 50.0000 mg | ORAL_TABLET | Freq: Every day | ORAL | 3 refills | Status: DC

## 2024-07-14 NOTE — Patient Instructions (Addendum)
 Medication Instructions:  Your physician has recommended you make the following change in your medication:   1) STOP metoprolol  tartrate (Lopressor ) 2) START metoprolol  succinate (Toprol  XL) 100 mg daily 3) START losartan  50 mg daily  *If you need a refill on your cardiac medications before your next appointment, please call your pharmacy*  Lab Work: In 1 week: BMP, magnesium , iron, renal aldosterone, LP(a) If you have labs (blood work) drawn today and your tests are completely normal, you will receive your results only by: MyChart Message (if you have MyChart) OR A paper copy in the mail If you have any lab test that is abnormal or we need to change your treatment, we will call you to review the results.  Testing/Procedures: 24 HOUR BLOOD PRESSURE MONITOR Your physician has requested that you wear a 24 hour blood pressure monitor. Our office will contact you to come in to set this up.  CORONARY CTA Your physician has requested that you have cardiac CT. Cardiac computed tomography (CT) is a painless test that uses an x-ray machine to take clear, detailed pictures of your heart. For further information please visit [email or web address] Please follow instruction sheet as given.  CAROTID DOPPLER Your physician has requested that you have a carotid duplex. This test is an ultrasound of the carotid arteries in your neck. It looks at blood flow through these arteries that supply the brain with blood. Allow one hour for this exam. There are no restrictions or special instructions.  Follow-Up: At [facility], you and your health needs are our priority.  As part of our continuing mission to provide you with exceptional heart care, our providers are all part of one team.  This team includes your primary Cardiologist (physician) and Advanced Practice Providers or APPs (Physician Assistants and Nurse Practitioners) who all work together to provide you with the care you need, when you need  it.  Your next appointment:   3 month(s)  Provider:   Joelle VEAR [name], MD   We recommend signing up for the patient portal called MyChart.  Sign up information is provided on this After Visit Summary.  MyChart is used to connect with patients for Virtual Visits (Telemedicine).  Patients are able to view lab/test results, encounter notes, upcoming appointments, etc.  Non-urgent messages can be sent to your provider as well.    To learn more about what you can do with MyChart, go to [email or web address].   Other Instructions You have been referred to our genetic counselor, Dr. Fairy. A scheduling team member will contact you to setup an appointment to meet with her.    Your cardiac CT will be scheduled at one of the below locations:   [facility] [address] [place], [place] [address] [phone] (Severe contrast allergies only)  OR   [facility] [address] [place], [place] [address] [phone]  OR   [facility] [address] [place], [place] [address] [phone]  OR   [facility] Tower [address]  [place], [place] [address] [phone]  OR   [facility] [address] [place], [place] [phone]  If scheduled at [facility], please arrive at the [facility] and Children's Entrance (Entrance C2) of [facility] 30 minutes prior to test start time. You can use the FREE valet parking offered at entrance C (encouraged to control the heart rate for the test)  Proceed to the [facility] Department (first floor) to check-in and test prep.  All radiology patients and guests should use entrance C2 at [facility], accessed from [facility], even though the hospital's physical address listed is [address].  If scheduled at the Heart and Vascular Tower at [address], please enter the parking lot using  the [address] entrance and use the FREE valet service at the patient drop-off area. Enter the building and check-in with registration on the main floor.  If scheduled at [facility], please arrive to the Heart and Vascular Center 15 mins early for check-in and test prep.  There is spacious parking and easy access to the radiology department from the [facility] Heart and Vascular entrance. Please enter here and check-in with the desk attendant.   If scheduled at [facility], please arrive 30 minutes early for check-in and test prep.  Please follow these instructions carefully (unless otherwise directed):  An IV will be required for this test and Nitroglycerin  will be given.   On the Night Before the Test: Be sure to Drink plenty of water. Do not consume any caffeinated/decaffeinated beverages or chocolate 12 hours prior to your test. Do not take any antihistamines 12 hours prior to your test.  On the Day of the Test: Drink plenty of water until 1 hour prior to the test. Do not eat any food 1 hour prior to test. You may take your regular medications prior to the test.  Take your metoprolol  (Toprol  XL) two hours prior to test. Patients who wear a continuous glucose monitor MUST remove the device prior to scanning. FEMALES- please wear underwire-free bra if available, avoid dresses & tight clothing      After the Test: Drink plenty of water. After receiving IV contrast, you may experience a mild flushed feeling. This is normal. On occasion, you may experience a mild rash up to 24 hours after the test. This is not dangerous. If this occurs, you can take Benadryl  25 mg, Zyrtec , Claritin, or Allegra and increase your fluid intake. (Patients taking Tikosyn should avoid Benadryl , and may take Zyrtec , Claritin, or Allegra) If you experience trouble breathing, this can be serious. If it is severe call 911 IMMEDIATELY. If it is mild, please call our office.  We will  call to schedule your test 2-4 weeks out understanding that some insurance companies will need an authorization prior to the service being performed.   For more information and frequently asked questions, please visit our website : [email or web address]  For non-scheduling related questions, please contact the cardiac imaging nurse navigator should you have any questions/concerns: Cardiac Imaging Nurse Navigators Direct Office Dial: [phone]   For scheduling needs, including cancellations and rescheduling, please call [place], [phone].

## 2024-07-17 ENCOUNTER — Other Ambulatory Visit: Payer: Self-pay | Admitting: Surgery

## 2024-07-17 DIAGNOSIS — Z8679 Personal history of other diseases of the circulatory system: Secondary | ICD-10-CM

## 2024-07-18 NOTE — Progress Notes (Signed)
 Population Health Department [facility] Worker Note  Contact made with:  Contacts     Contact Date/Time Type Contact Phone/Fax   07/18/2024 02:18 PM EDT Phone ([address]) Sydney [name], Sydney [name] (Self) [phone] ([name])   Successfully contacted patient      CHW Visit indicated: No  Home Visit Appointment Scheduled: NO  Notes:   CHW followed up with patient because she hadn't heard from her Patient states that she has not felt well enough to reach out to anyone regarding her rent.  She states her provider has denied her anymore pain medication and she is too much pain right now. She has an appointment tomorrow 10/14 and she's hoping she can get what she needs Patient stated she is just going to pay her rent with the money she receives for her grandson She is only concerned now with her utility bill of $166 She is going to reach out to her php today and also call 211 CHW will send a referral to [facility] after she tries those options.    Lillie Celena Pittman, CHWorker

## 2024-07-19 ENCOUNTER — Encounter: Payer: Self-pay | Admitting: Surgery

## 2024-07-19 ENCOUNTER — Ambulatory Visit: Payer: Self-pay | Attending: Surgery | Admitting: Surgery

## 2024-07-19 ENCOUNTER — Ambulatory Visit (HOSPITAL_COMMUNITY)
Admission: RE | Admit: 2024-07-19 | Discharge: 2024-07-19 | Disposition: A | Discharge status: Home / Self Care | Source: Ambulatory Visit | Attending: Cardiovascular Disease | Admitting: Cardiovascular Disease

## 2024-07-19 VITALS — BP 116/76 | HR 72 | Resp 18 | Ht 69.0 in

## 2024-07-19 DIAGNOSIS — Z8679 Personal history of other diseases of the circulatory system: Secondary | ICD-10-CM | POA: Diagnosis present

## 2024-07-19 DIAGNOSIS — Z95828 Presence of other vascular implants and grafts: Secondary | ICD-10-CM

## 2024-07-19 DIAGNOSIS — Z9889 Other specified postprocedural states: Secondary | ICD-10-CM | POA: Diagnosis present

## 2024-07-20 NOTE — Progress Notes (Signed)
 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  She had an uncomplicated postoperative course and was discharged home on 06/25/2024.  When she returned to the office on 06/29/2024 for suture removal she reported some numbness in her right hand with decreased grip.  She came into the emergency room on 07/08/2024.  She had a CTA of the chest which was read as showing a right axillary artery pseudoaneurysm although this is really the stump of the axillary artery graft that was ligated.  There is also small contrast blush to the right anterolateral aspect of the ascending aortic graft which radiology read as a possible small leak.  In reality this is the stump of the SideArm graft that was ligated.  There was small amount of mediastinal hematoma around the graft as expected.  There was also felt to be some thrombus in the right internal jugular vein probably related to the sheath that was there for surgery.  She was admitted for management of her right upper extremity pain and numbness and was started on Eliquis for possible right IJ thrombus.  She was discharged the following day on Eliquis, Lyrica, and methocarbamol .  She was seen back in our office by our PA on 07/13/2024 continuing to complain of right upper extremity pain and numbness.  She wanted more narcotics which were not felt to be the proper treatment for her neuropathy and the Lyrica was increased to 3 times per day.  She reports that she is continue to have significant numbness and discomfort in the right upper extremity going down into the ulnar aspect of the hand and 4th and 5th fingers.  This seems to bother her most  at night.  She is otherwise feeling well without chest pain or shortness of breath.  Current Outpatient Medications  Medication Sig Dispense Refill   acetaminophen  (TYLENOL ) 500 MG tablet Take 1,000 mg by mouth 2 (two) times daily as needed (pain).     amLODipine  (NORVASC ) 10 MG tablet Take 1 tablet (10 mg total) by mouth daily. 30 tablet 1   apixaban (ELIQUIS) 5 MG TABS tablet Take 2 tablets (10 mg total) by mouth 2 (two) times daily for 5 days, THEN 1 tablet (5 mg total) 2 (two) times daily. 60 tablet 1   losartan  (COZAAR ) 50 MG tablet Take 1 tablet (50 mg total) by mouth daily. 90 tablet 3   methocarbamol  (ROBAXIN ) 500 MG tablet Take 1 tablet (500 mg total) by mouth every 8 (eight) hours as needed for muscle spasms. 21 tablet 0   metoprolol  succinate (TOPROL -XL) 100 MG 24 hr tablet Take 1 tablet (100 mg total) by mouth daily. Take with or immediately following a meal. 90 tablet 3   pregabalin (LYRICA) 25 MG capsule Take 1 capsule (25 mg total) by mouth 2 (two) times daily. (Patient taking differently: Take 25 mg by mouth 3 (three) times daily.) 60 capsule 1   No current facility-administered medications for this visit.     Physical Exam: BP 116/76   Pulse 72   Resp 18   Ht 5' 9 (1.753 m)   LMP 04/26/2016  SpO2 92%   BMI 23.18 kg/m  She looks well. Cardiac exam shows a regular rate and rhythm with normal heart sounds.  There is no murmur. Lungs are clear. The chest incision is well-healed and the sternum is stable. The right axillary incision is well-healed. There is no peripheral edema. There is still some decrease strength in the right grip particularly involving the 4th and 5th fingers.  She has normal range of motion of her upper extremity.  Diagnostic Tests:  Narrative & Impression  CLINICAL DATA:  aortic dissection   EXAM: CHEST - 2 VIEW   COMPARISON:  07/08/2024   FINDINGS: No focal airspace consolidation, pleural effusion, or pneumothorax. No cardiomegaly.No  acute fracture or destructive lesion.   IMPRESSION: No acute cardiopulmonary abnormality.     Electronically Signed   By: Rogelia Myers M.D.   On: 07/19/2024 10:29    Impression:  Overall I think she is doing reasonably well 1 month following her surgery for an acute aortic dissection.  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  She said that she had some problems before surgery but they have significantly worsened.  I asked her to continue the Lyrica and methocarbamol  for now.  I do not think narcotics are likely be effective for this type of pain.  I would expect this to gradually resolve but it may take several months and sometimes more than 6 months.  She said that she has been seen in a pain clinic before for back pain and I told her that I would be happy to refer her back to that pain clinic if she desires.  Plan:  I will see her back at 6 months for a CTA of the chest for aortic surveillance.   Dorise MARLA Fellers, MD [facility] [phone]

## 2024-07-24 ENCOUNTER — Encounter (HOSPITAL_COMMUNITY): Payer: Self-pay

## 2024-07-24 ENCOUNTER — Ambulatory Visit (HOSPITAL_COMMUNITY)
Admission: RE | Admit: 2024-07-24 | Discharge: 2024-07-24 | Disposition: A | Discharge status: Home / Self Care | Source: Ambulatory Visit

## 2024-07-24 DIAGNOSIS — R200 Anesthesia of skin: Secondary | ICD-10-CM | POA: Diagnosis present

## 2024-07-26 ENCOUNTER — Ambulatory Visit (HOSPITAL_COMMUNITY)
Admission: RE | Admit: 2024-07-26 | Discharge: 2024-07-26 | Disposition: A | Discharge status: Home / Self Care | Source: Ambulatory Visit

## 2024-07-26 DIAGNOSIS — R079 Chest pain, unspecified: Secondary | ICD-10-CM | POA: Insufficient documentation

## 2024-07-26 MED ORDER — NITROGLYCERIN 0.4 MG SL SUBL
0.8000 mg | SUBLINGUAL_TABLET | Freq: Once | SUBLINGUAL | Status: AC
  Administered 2024-07-26: 0.8 mg via SUBLINGUAL

## 2024-07-26 MED ORDER — IOHEXOL 350 MG/ML SOLN
100.0000 mL | Freq: Once | INTRAVENOUS | Status: AC | PRN
  Administered 2024-07-26: 100 mL via INTRAVENOUS

## 2024-07-29 ENCOUNTER — Ambulatory Visit: Payer: Self-pay

## 2024-07-30 ENCOUNTER — Other Ambulatory Visit: Payer: Self-pay | Admitting: Surgical

## 2024-07-31 ENCOUNTER — Telehealth: Payer: Self-pay | Admitting: *Deleted

## 2024-07-31 ENCOUNTER — Other Ambulatory Visit: Payer: Self-pay

## 2024-07-31 MED ORDER — PREGABALIN 75 MG PO CAPS: 75.0000 mg | ORAL_CAPSULE | Freq: Two times a day (BID) | ORAL | 0 refills | Status: DC

## 2024-07-31 NOTE — Telephone Encounter (Signed)
 Patient contacted the office requesting her Lyrica be increased from 75 mg daily to 200 mg daily. States 75 is not cutting her neuropathy but taking 200 mg daily is. Patient states she takes 4 tablets of Lyrica twice daily. Strongly advised patient to not increase medications without provider approval. Patient verbalized understanding regarding adjusting own medications. Per L. Fenyak, PA, new dosage of Lyrica to be sent into pharmacy. Patient educated to only take 75 mg of Lyrica BID. Advised to discuss further adjustment of medication with PCP on 11/5. Patient verbalized understanding.

## 2024-07-31 NOTE — Progress Notes (Signed)
-  Patient called and stated that taking 200 mg BID of lyrica has been helping her neuropathic pain.  She has been taking this not as prescribed.  She was prescribed 25 mg TID for neuropathic pain -Titrated lyrica up to 75 mg BID for patient at this time for neuropathic pain -She is to discuss with PCP and for possible increases in dosage or be referred to pain clinic since she is not using medications as prescribed

## 2024-08-03 ENCOUNTER — Ambulatory Visit (HOSPITAL_COMMUNITY)
Admission: RE | Admit: 2024-08-03 | Discharge: 2024-08-03 | Disposition: A | Discharge status: Home / Self Care | Source: Ambulatory Visit | Attending: Internal Medicine | Admitting: Internal Medicine

## 2024-08-03 DIAGNOSIS — Z95828 Presence of other vascular implants and grafts: Secondary | ICD-10-CM | POA: Insufficient documentation

## 2024-08-03 DIAGNOSIS — I10 Essential (primary) hypertension: Secondary | ICD-10-CM | POA: Diagnosis not present

## 2024-08-03 LAB — ECHOCARDIOGRAM COMPLETE
Area-P 1/2: 4.44 cm2
S' Lateral: 2.7 cm

## 2024-08-04 ENCOUNTER — Ambulatory Visit: Payer: Self-pay

## 2024-08-04 ENCOUNTER — Other Ambulatory Visit (HOSPITAL_COMMUNITY): Payer: Self-pay

## 2024-08-04 MED ORDER — METOPROLOL TARTRATE 50 MG PO TABS
50.0000 mg | ORAL_TABLET | Freq: Two times a day (BID) | ORAL | 2 refills | Status: DC
  Filled 2024-08-04: qty 90, 45d supply, fill #0

## 2024-08-04 MED ORDER — AMLODIPINE BESYLATE 10 MG PO TABS
10.0000 mg | ORAL_TABLET | Freq: Every day | ORAL | 1 refills | Status: AC
  Filled 2024-08-04: qty 90, 90d supply, fill #0

## 2024-08-04 NOTE — Telephone Encounter (Signed)
 I called the father to discuss the results of her ultrasound.  It showed that she had severely dilated right atrium and moderate pulmonary regurgitation.  The CT also showed dilated main PA.  This suggest that she likely has evidence of pulmonary hypertension.  She reported some chest pain but recent coronary CTA with no evidence of obstruction.  I let her know that we can move her appointment to earlier in December so that I can discuss next steps.  We would likely proceed with right heart cath with shunt run.  She also reported that going to switch from mid to tartrate to succinate, her blood pressure significantly increased and so she went back to metop tartrate 50 mg BID.   Joelle DEL. Ren Ny, MD [facility]

## 2024-08-09 ENCOUNTER — Telehealth: Payer: Self-pay | Admitting: *Deleted

## 2024-08-09 NOTE — Telephone Encounter (Signed)
 Patient contacted office. States she was seen by her PCP today and was told to contact our office regarding something sticking out from axillary incision. States area is slightly red. Denies drainage or swelling. Per patient, it does not look like a stitch. States it is hard. Patient requesting provider look at area. Advised patient can be seen tomorrow. Appt scheduled with PA at 1pm.

## 2024-08-10 ENCOUNTER — Ambulatory Visit

## 2024-08-10 VITALS — BP 128/86 | HR 74 | Resp 20 | Ht 69.0 in | Wt 161.0 lb

## 2024-08-10 DIAGNOSIS — Z95828 Presence of other vascular implants and grafts: Secondary | ICD-10-CM

## 2024-08-10 NOTE — Patient Instructions (Signed)
-  Follow up with cardiology as scheduled -Follow up in 6 months with our clinic with CTA of chest

## 2024-08-10 NOTE — Progress Notes (Addendum)
 [address] [place] [address]             [phone]       HPI:  Sydney [name] is a 53 year-old woman with a past medical history significant for tobacco use, hypertension, GIST tumor of the stomach, chronic back and leg pain, depression, thyromegaly who returns for routine postoperative follow-up having undergone Replacement of the ascending aorta (hemi-arch) using a 32 mm Hemashield graft for a type I aortic dissection with axillary cannulation  on 06/19/2024 with Dr. Lucas.   She presents today for an incision site check. Her axillary incision has a small piece of suture protruding from the site.  No drainage, rash or erythema. This area appeared when she was mopping the floor and felt a slight pop along the incision site. She is still experiencing upper arm pain and neuropathy in her right hand. She has found that lyrica has helped drastically with her brachial plexopathy pain.   She does report having chest tightness and headaches. The chest tightness is not everyday and is mostly random.  She has been consistently checking her blood pressure at home and it has been controled.  She is currently undergoing evaluation by cardiology for this.   Allergies as of 08/10/2024       Reactions   Asa [aspirin] Other (See Comments)   Hx GI bleed        Medication List        Accurate as of August 10, 2024  1:54 PM. If you have any questions, ask your nurse or doctor.          acetaminophen  500 MG tablet Commonly known as: TYLENOL  Take 1,000 mg by mouth 2 (two) times daily as needed (pain).   amLODipine  10 MG tablet Commonly known as: NORVASC  Take 1 tablet (10 mg total) by mouth daily.   apixaban 5 MG Tabs tablet Commonly known as: ELIQUIS Take 2 tablets (10 mg total) by mouth 2 (two) times daily for 5 days, THEN 1 tablet (5 mg total) 2 (two) times daily. Start taking on: July 09, 2024   losartan  50 MG tablet Commonly known as:  COZAAR  Take 1 tablet (50 mg total) by mouth daily.   methocarbamol  500 MG tablet Commonly known as: ROBAXIN  Take 1 tablet (500 mg total) by mouth every 8 (eight) hours as needed for muscle spasms.   metoprolol  tartrate 50 MG tablet Commonly known as: LOPRESSOR  Take 1 tablet (50 mg total) by mouth 2 (two) times daily.   pregabalin 75 MG capsule Commonly known as: Lyrica Take 1 capsule (75 mg total) by mouth 2 (two) times daily.         ROS Review of Systems  Constitutional:  Negative for malaise/fatigue.  Respiratory:  Negative for cough and shortness of breath.   Cardiovascular:  Negative for chest pain, palpitations and leg swelling.      BP 128/86   Pulse 74   Resp 20   Ht 5' 9 (1.753 m)   Wt 161 lb (73 kg)   LMP 04/26/2016   SpO2 95% Comment: RA  BMI 23.78 kg/m   Physical Exam Constitutional:      Appearance: Normal appearance.  HENT:     Head: Normocephalic and atraumatic.  Musculoskeletal:     Cervical back: Normal range of motion.  Skin:    General: Skin is warm and dry.      Neurological:  General: No focal deficit present.     Mental Status: She is alert.     Imaging: N/A   Assessment/Plan:  S/P ascending aortic replacement -Removed one suture knot that was protruding from incision site. No signs of infection.  Continue to keep incisions clean and dry -She should continue with lyrica as prescribed for neuropathic pain  -Increase activity/exercise as tolerated, continue to use caution with lifting  -Continue follow up with cardiology as scheduled -Follow up with [facility] as scheduled, appointment in 6 months with CTA of chest    [name], PA-C 1:54 PM 08/10/24

## 2024-08-15 ENCOUNTER — Ambulatory Visit: Attending: Genetic Counselor | Admitting: Genetic Counselor

## 2024-08-16 NOTE — Progress Notes (Addendum)
 Population Health Department [facility] Worker Note  Contact made with:  Contacts     Contact Date/Time Type Contact Phone/Fax   08/16/2024 09:32 AM EST Phone ([address] Harjit, [place] H (Self) [phone] (H)   Successfully contacted patient      CHW Visit indicated: No  Home Visit Appointment Scheduled: NO  Notes:   CHW received a call back from the patient stating she received a financial assistance approval email from [facility] Patient states it has CHWs name on it CHW advised she wasn't aware of an approval Patient is going to forward the email to view    CHW received a response email from [facility] advising they do not have a point of contact or a team in the patients area. They unfortunately are unable to assist    CHW received a text message from the patient asking her to give her a call CHW called patient and she states that she has tried every resource that was given to her and she hasn't had any luck. Every place is out of funds She's tried [employer], [employer], [employer] and she's called her php CHW advised that unfortunately that is definitely the case right now CHW advised she couldn't make any guarantees but she would send a referral to [employer]. Patient was ok with CHW sending a referral.  Patient states she has been given 10 days before they will start the eviction process She owes a total of $1806. She said she can contribute around $500 or $600 CHW will send the referral and follow up with the patient  Electronically signed by: Rogers Cruz Babe, CHWorker 08/16/2024 9:38 AM

## 2024-08-17 ENCOUNTER — Telehealth: Payer: Self-pay

## 2024-08-17 ENCOUNTER — Encounter: Payer: Self-pay | Admitting: Genetic Counselor

## 2024-08-17 NOTE — Telephone Encounter (Signed)
 STAT if HR is under 50 or over 120 (normal HR is 60-100 beats per minute)  What is your heart rate? 56  Do you have a log of your heart rate readings (document readings)? 56 - Today, 59- Yesterday  Do you have any other symptoms? Chest pressure (like a pinch) no pain

## 2024-08-17 NOTE — Telephone Encounter (Signed)
 Spoke with pt who is concerned about her heart rate being low.  Yesterday and the day before it was 48 and today was 56.  Pt denies any chest pain or shortness of breath but does report a pinching feeling on the right side of her chest that comes and goes.  It last a couple of seconds and resolves on this own.  Denies any dizziness/lightheadedness or syncope.  Also reporting a little chest heaviness/pressure and is concerned it may be related to results as demonstrated on recent echo from 10/31:  It showed that she had severely dilated right atrium and moderate pulmonary regurgitation. The CT also showed dilated main PA. This suggest that she likely has evidence of pulmonary hypertension-per Dr Joelle Cedars Tonleu.  Advised I will forward this information to him for review.  Reassurance given per recent coronary CTA with no evidence of obstruction. Also advised to check heart rate prior to taking Metoprolol  tartrate as this medication can/will further decrease the rate.   Message also sent to [facility] to reschedule for appt with Dr Olan Pac.

## 2024-08-17 NOTE — Telephone Encounter (Signed)
 Thank you for the message. She can decrease her metoprolol  to 25 mg twice a day.    Thank you Joelle  Pt aware of the above information.   She will split her 50 mg tablets in half and take twice daily.  She will call back if any further questions or concerns.

## 2024-08-21 ENCOUNTER — Other Ambulatory Visit: Payer: Self-pay | Admitting: Physician Assistant

## 2024-08-30 ENCOUNTER — Other Ambulatory Visit: Payer: Self-pay | Admitting: Physician Assistant

## 2024-08-30 ENCOUNTER — Other Ambulatory Visit: Payer: Self-pay | Admitting: Surgical

## 2024-09-04 ENCOUNTER — Other Ambulatory Visit: Payer: Self-pay

## 2024-09-06 ENCOUNTER — Telehealth: Payer: Self-pay | Admitting: *Deleted

## 2024-09-06 ENCOUNTER — Telehealth: Payer: Self-pay

## 2024-09-06 DIAGNOSIS — I8290 Acute embolism and thrombosis of unspecified vein: Secondary | ICD-10-CM

## 2024-09-06 MED ORDER — METOPROLOL TARTRATE 50 MG PO TABS: 25.0000 mg | ORAL_TABLET | Freq: Two times a day (BID) | ORAL | 3 refills | Status: AC

## 2024-09-06 NOTE — Telephone Encounter (Signed)
 Unable to refill Lyrica  pt must contact PCP for refill.

## 2024-09-06 NOTE — Telephone Encounter (Signed)
 Requested Prescriptions   Signed Prescriptions Disp Refills   metoprolol  tartrate (LOPRESSOR ) 50 MG tablet 90 tablet 3    Sig: Take 0.5 tablets (25 mg total) by mouth 2 (two) times daily.    Authorizing Provider: [name] JOELLE VEAR    Ordering User: WILFRED, Elior Robinette  C

## 2024-09-06 NOTE — Telephone Encounter (Signed)
*  STAT* If patient is at the pharmacy, call can be transferred to refill team.   1. Which medications need to be refilled? (please list name of each medication and dose if known) apixaban  (ELIQUIS ) 5 MG TABS tablet, metoprolol  tartrate (LOPRESSOR ) 50 MG tablet, pregabalin  (LYRICA ) 75 MG capsule     2. Would you like to learn more about the convenience, safety, & potential cost savings by using the [facility]? NO    3. Are you open to using the [facility] NO   4. Which pharmacy/location (including street and city if local pharmacy) is medication to be sent to?  [id number] - [place], [place] - [address] AT [facility] [address]     5. Do they need a 30 day or 90 day supply? 90

## 2024-09-06 NOTE — Telephone Encounter (Signed)
 Pt aware to contact pcp for Lyrica .  Pt mentioned she is cutting Metoprolol  50 mg 1/2 tablet bid.

## 2024-09-08 NOTE — Telephone Encounter (Signed)
 Pt called back in regarding Eliquis  refill. Please advise.

## 2024-09-08 NOTE — Progress Notes (Deleted)
       Cardiology Office Note Date:  09/08/2024  ID:  Sydney [name], Sydney [name] September 26, 1971, MRN [id number] PCP:  Tammy Tari DASEN, PA-C  Cardiologist:   Joelle VEAR [name], MD  No chief complaint on file.   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 Went back to metop tartrate 50 BID due to elevated BP reading Ultimately decreased MT to 25 BID due to bradycardia to 50s 12/25: RHC w/ shun run given dilated PA on CT and severely dilated RA/mod PR  History of Present Illness: Sydney [name] is a 53 y.o. female who presents for follow up.   ROS: Otherwise negative  Physical Exam VS:  LMP 04/26/2016  , BMI There is no height or weight on file to calculate BMI. GEN: Well nourished, well developed, in no acute distress HEENT: normal Neck: no JVD, carotid bruits, or masses Cardiac: ***RRR; no murmurs, rubs, or gallops,no edema  Respiratory:  CTAB bilaterally, normal work of breathing GI: soft, nontender, nondistended, + BS Extremities: No LE edema Skin: warm and dry, no rash Neuro:  Strength and sensation are intact  Recent Labs: Reviewed  ASSESSMENT AND PLAN Sydney [name] is a 53 y.o. female who presents for follow up.  - *** - *** - ***    Signed, Joelle VEAR [name], MD  09/08/2024 3:06 PM    [facility]

## 2024-09-11 ENCOUNTER — Other Ambulatory Visit: Payer: Self-pay | Admitting: *Deleted

## 2024-09-11 ENCOUNTER — Ambulatory Visit

## 2024-09-11 DIAGNOSIS — R079 Chest pain, unspecified: Secondary | ICD-10-CM

## 2024-09-11 DIAGNOSIS — I8290 Acute embolism and thrombosis of unspecified vein: Secondary | ICD-10-CM

## 2024-09-11 DIAGNOSIS — Z720 Tobacco use: Secondary | ICD-10-CM

## 2024-09-11 DIAGNOSIS — I371 Nonrheumatic pulmonary valve insufficiency: Secondary | ICD-10-CM

## 2024-09-11 DIAGNOSIS — I10 Essential (primary) hypertension: Secondary | ICD-10-CM

## 2024-09-11 DIAGNOSIS — Z95828 Presence of other vascular implants and grafts: Secondary | ICD-10-CM

## 2024-09-11 MED ORDER — APIXABAN 5 MG PO TABS: 5.0000 mg | ORAL_TABLET | Freq: Two times a day (BID) | ORAL | 10 refills | Status: AC

## 2024-09-11 NOTE — Telephone Encounter (Addendum)
 Spoke with patient and she states she needed her Eliquis  refill sent to [facility] on [address]  Eliquis  5mg  refill request received. Patient is 53 years old, weight-73kg, Crea-1.01 on 07/08/24, Diagnosis-thrombus, and last seen by Dr. Tilmon on 07/14/24. Dose is appropriate based on dosing criteria.  07/09/24 Dr. Kerrin note states: Right internal jugular thrombus-on Apixaban  10 mg bid for 7 days followed by 5 mg bid

## 2024-09-11 NOTE — Addendum Note (Signed)
 Addended by: DIONISIO SENIOR B on: 09/11/2024 12:17 PM   Modules accepted: Orders

## 2024-09-11 NOTE — Telephone Encounter (Signed)
 Spoke with patient and she states she needed her Eliquis  refill sent to [facility] on [address]  Eliquis  5mg  refill request received. Patient is 53 years old, weight-73kg, Crea-1.01 on 07/08/24, Diagnosis-thrombus, and last seen by Dr. Tilmon on 07/14/24. Dose is appropriate based on dosing criteria.  07/09/24 Dr. Kerrin note states: Right internal jugular thrombus-on Apixaban  10 mg bid for 7 days followed by 5 mg bid

## 2024-09-11 NOTE — Addendum Note (Signed)
 Addended by: DIONISIO SENIOR B on: 09/11/2024 11:30 AM   Modules accepted: Orders

## 2024-09-12 ENCOUNTER — Ambulatory Visit

## 2024-09-12 ENCOUNTER — Ambulatory Visit: Admitting: Genetic Counselor

## 2024-09-12 VITALS — BP 123/83 | HR 60 | Ht 69.0 in | Wt 166.3 lb

## 2024-09-12 DIAGNOSIS — I517 Cardiomegaly: Secondary | ICD-10-CM

## 2024-09-12 DIAGNOSIS — Z95828 Presence of other vascular implants and grafts: Secondary | ICD-10-CM

## 2024-09-12 DIAGNOSIS — Z720 Tobacco use: Secondary | ICD-10-CM

## 2024-09-12 DIAGNOSIS — I7101 Dissection of ascending aorta: Secondary | ICD-10-CM

## 2024-09-12 DIAGNOSIS — Z01812 Encounter for preprocedural laboratory examination: Secondary | ICD-10-CM

## 2024-09-12 DIAGNOSIS — I10 Essential (primary) hypertension: Secondary | ICD-10-CM

## 2024-09-12 LAB — CBC
Hematocrit: 38.2 % (ref 34.0–46.6)
Hemoglobin: 11.5 g/dL (ref 11.1–15.9)
MCH: 25.6 pg — ABNORMAL LOW (ref 26.6–33.0)
MCHC: 30.1 g/dL — ABNORMAL LOW (ref 31.5–35.7)
MCV: 85 fL (ref 79–97)
Platelets: 341 x10E3/uL (ref 150–450)
RBC: 4.49 x10E6/uL (ref 3.77–5.28)
RDW: 14.8 % (ref 11.7–15.4)
WBC: 5.7 x10E3/uL (ref 3.4–10.8)

## 2024-09-12 NOTE — Patient Instructions (Addendum)
 Medication Instructions:  Your physician recommends that you continue on your current medications as directed. Please refer to the Current Medication list given to you today.  *If you need a refill on your cardiac medications before your next appointment, please call your pharmacy*  Lab Work: CBC If you have labs (blood work) drawn today and your tests are completely normal, you will receive your results only by: MyChart Message (if you have MyChart) OR A paper copy in the mail If you have any lab test that is abnormal or we need to change your treatment, we will call you to review the results.  Testing/Procedures: You are scheduled for a Cardiac Catheterization on Wednesday, December 17 with Dr. Alm Clay.  1. Please arrive at the [facility] (Main Entrance A) at [facility]: [address] [place], [place] [address] at 11:00 AM (This time is 2 hour(s) before your procedure to ensure your preparation).   Free valet parking service is available. You will check in at ADMITTING. The support person will be asked to wait in the waiting room.  It is OK to have someone drop you off and come back when you are ready to be discharged.    Special note: Every effort is made to have your procedure done on time. Please understand that emergencies sometimes delay scheduled procedures.  2. Diet: Light meals may be eaten up to 6 hours before scheduled procedures from 12N and after; please stop eating at 7:00 AM   Light meal consist of plain toast, fruit, light soups, crackers.   3. Hydration: You need to be well hydrated before your procedure. On December 17, you may drink approved liquids (see below) until 2 hours before the procedure, with 16 oz of water  as your last intake.   List of approved liquids water , clear juice, clear tea, black coffee, fruit juices, non-citric and without pulp, carbonated beverages, Gatorade, Kool -Aid, plain Jello-O and plain ice popsicles.  4. Labs: You will  need to have blood drawn on TODAY.  5. Medication instructions in preparation for your procedure:   Contrast Allergy: No  Stop taking Eliquis  (Apixiban) on Sunday, December 14. (Last dose Sunday night).   On the morning of your procedure, take your Aspirin 81 mg and any morning medicines NOT listed above.  You may use sips of water .  6. Plan to go home the same day, you will only stay overnight if medically necessary. 7. Bring a current list of your medications and current insurance cards. 8. You MUST have a responsible person to drive you home. 9. Someone MUST be with you the first 24 hours after you arrive home or your discharge will be delayed. 10. Please wear clothes that are easy to get on and off and wear slip-on shoes.  Thank you for allowing us  to care for you!   -- [facility]   Follow-Up: At [facility], you and your health needs are our priority.  As part of our continuing mission to provide you with exceptional heart care, our providers are all part of one team.  This team includes your primary Cardiologist (physician) and Advanced Practice Providers or APPs (Physician Assistants and Nurse Practitioners) who all work together to provide you with the care you need, when you need it.  Your next appointment:   3 month(s)  Provider:   Joelle VEAR [name], MD

## 2024-09-12 NOTE — Progress Notes (Addendum)
       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 Went back to metop tartrate 50 BID due to elevated BP reading Ultimately decreased MT to 25 BID due to bradycardia to 50s 12/25: RHC w/ shun run given dilated PA on CT and severely dilated RA/mod PR  History of Present Illness: Discussed the use of AI scribe software for clinical note transcription with the patient, who gave verbal consent to proceed.  Sydney [name] is a 53 y.o. female who presents for follow up.  She notes consistent morning blood pressure around 140/110 mmHg before medication, measured with an arm digital monitor. After taking metoprolol  50 mg twice a day and amlodipine , her readings normalize during the day. She is supposed to be taking 25 mg twice a day. She has arm pain and numbness affecting two fingers, with partial relief on Lyrica , and persistent numbness in her toes. She denies any chest pain or dizziness. She feels very tired and sleeps much of the day, which limits her ability to care for her nine-year-old child.        ROS: Otherwise negative  Physical Exam VS:  BP 123/83 Comment: recheck  Pulse 60   Ht 5' 9 (1.753 m)   Wt 166 lb 4.8 oz (75.4 kg)    LMP 04/26/2016   SpO2 98%   BMI 24.56 kg/m  , BMI Body mass index is 24.56 kg/m. GEN: Well nourished, well developed, in no acute distress HEENT: normal Neck: no JVD, carotid bruits, or masses Cardiac: RRR; no murmurs, rubs, or gallops,no edema  Respiratory:  CTAB bilaterally, normal work of breathing GI: soft, nontender, nondistended, + BS Extremities: No LE edema Skin: warm and dry, no rash Neuro:  Strength and sensation are intact  Recent Labs: Reviewed  ASSESSMENT AND PLAN Sydney [name] is a 53 y.o. female who presents for follow up.     Enlarged right ventricle Enlargement suggests pulmonary hypertension, especially in smokers. Further investigation required due to her young age. - Ordered right heart catheterization w/ shunt run to assess pressures and determine cause.  Essential hypertension Blood pressure elevated in early morning, normalizes post-medication. Current regimen includes metoprolol  and amlodipine . Pulse low but asymptomatic.  - Adjusted metoprolol  to 25 mg twice daily; patient had been taking 50 mg BID - Continue amlodipine  as prescribed. - Rechecked blood pressure post-medication for daytime control.  - Reminder to do labs ordered during prior visit.   Signed, Joelle VEAR [name], MD  09/12/2024 2:36 PM    [facility]

## 2024-09-12 NOTE — H&P (View-Only) (Signed)
       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 Went back to metop tartrate 50 BID due to elevated BP reading Ultimately decreased MT to 25 BID due to bradycardia to 50s 12/25: RHC w/ shun run given dilated PA on CT and severely dilated RA/mod PR  History of Present Illness: Discussed the use of AI scribe software for clinical note transcription with the patient, who gave verbal consent to proceed.  Sydney [name] is a 53 y.o. female who presents for follow up.  She notes consistent morning blood pressure around 140/110 mmHg before medication, measured with an arm digital monitor. After taking metoprolol  50 mg twice a day and amlodipine , her readings normalize during the day. She is supposed to be taking 25 mg twice a day. She has arm pain and numbness affecting two fingers, with partial relief on Lyrica , and persistent numbness in her toes. She denies any chest pain or dizziness. She feels very tired and sleeps much of the day, which limits her ability to care for her nine-year-old child.        ROS: Otherwise negative  Physical Exam VS:  BP 123/83 Comment: recheck  Pulse 60   Ht 5' 9 (1.753 m)   Wt 166 lb 4.8 oz (75.4 kg)    LMP 04/26/2016   SpO2 98%   BMI 24.56 kg/m  , BMI Body mass index is 24.56 kg/m. GEN: Well nourished, well developed, in no acute distress HEENT: normal Neck: no JVD, carotid bruits, or masses Cardiac: RRR; no murmurs, rubs, or gallops,no edema  Respiratory:  CTAB bilaterally, normal work of breathing GI: soft, nontender, nondistended, + BS Extremities: No LE edema Skin: warm and dry, no rash Neuro:  Strength and sensation are intact  Recent Labs: Reviewed  ASSESSMENT AND PLAN Sydney [name] is a 53 y.o. female who presents for follow up.     Enlarged right ventricle Enlargement suggests pulmonary hypertension, especially in smokers. Further investigation required due to her young age. - Ordered right heart catheterization w/ shunt run to assess pressures and determine cause.  Essential hypertension Blood pressure elevated in early morning, normalizes post-medication. Current regimen includes metoprolol  and amlodipine . Pulse low but asymptomatic.  - Adjusted metoprolol  to 25 mg twice daily; patient had been taking 50 mg BID - Continue amlodipine  as prescribed. - Rechecked blood pressure post-medication for daytime control.  - Reminder to do labs ordered during prior visit.   Signed, Joelle VEAR [name], MD  09/12/2024 2:36 PM    [facility]

## 2024-09-12 NOTE — Progress Notes (Deleted)
       Cardiology Office Note Date:  09/12/2024  ID:  Sydney, [name] 07-04-1971, MRN [id number] PCP:  Tammy Tari DASEN, PA-C  Cardiologist:   Joelle VEAR [name], MD  No chief complaint on file.   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 Went back to metop tartrate 50 BID due to elevated BP reading Ultimately decreased MT to 25 BID due to bradycardia to 50s 12/25: RHC w/ shun run given dilated PA on CT and severely dilated RA/mod PR  History of Present Illness: Sydney [name] is a 53 y.o. female who presents for follow up.   ROS: Otherwise negative  Physical Exam VS:  LMP 04/26/2016  , BMI There is no height or weight on file to calculate BMI. GEN: Well nourished, well developed, in no acute distress HEENT: normal Neck: no JVD, carotid bruits, or masses Cardiac: ***RRR; no murmurs, rubs, or gallops,no edema  Respiratory:  CTAB bilaterally, normal work of breathing GI: soft, nontender, nondistended, + BS Extremities: No LE edema Skin: warm and dry, no rash Neuro:  Strength and sensation are intact  Recent Labs: Reviewed  ASSESSMENT AND PLAN Sydney [name] is a 53 y.o. female who presents for follow up.  - *** - *** - ***    Signed, Joelle VEAR [name], MD  09/12/2024 10:15 AM    [facility]

## 2024-09-12 NOTE — Progress Notes (Signed)
 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  Personal Medical Information Gay is a 53 y.o. African American lady who reports having chest pains the evening of September 15 th after her shift. As the pain worsened and she began having difficulty breathing, she called 911 and was taken to the ER. Subsequent CT-A detected dissection of the ascending aorta with the wall measuring 5.2 cm x 5.0 cm. She had a Type A repair and now reports some numbness in her arms.    Traditional Risk Factors Magie reports being diagnosed with HTN in her 88s and states that has not been well controlled with medication. Also notes significant history of smoking starting at 37 until her surgery- smoking is reported to weaken blood vessel walls leading to an aortopathy.   Family history She reports no faily history of aneurysms, dissections or repair.  She has three living children, a son age, 35 and two daughters, ages 16 and 73. A son passed away at 60 from a drug overdose. She has a 10 y.o. brother and 61 y.o. sister. Notes that both siblings and parents have HTN.   Pre-Test Genetic Consultation Notes  Informed Lavaughn that while congenital conditions that include a bicuspid aortic valve and conotruncal defects account for nearly 70% of aortic dilatations (AoD), idiopathic and genetic disorders account for 11% and 17% of AoD, respectively. Patient was counseled on the genetics of syndromes that present with aortic aneurysms and dissections, specifically Marfan syndrome (MFS), Loeys-Dietz syndrome (LDS), Vascular Ehlers-Danlos Syndrome (vEDS) and other non-syndromic familial forms of thoracic aortic aneurysms and dissection (FTAAD).   I explained that genetic testing is a probabilistic test dependent upon age and severity of presentation, presence of risk factors and importantly family history of aortic aneurysms or  sudden death in first-degree relatives.  Genetic testing can have three outcomes- While nearly 11% of AoD cases are idiopathic, a negative test result can be due to limitations of the genetic test. I explained that if a mutation is not identified, then all first-degree relatives should undergo cardiac screening for aortic aneurysms by echocardiogram. Per Edgar et al., 2023 "In sporadic TAAs (only one family member affected), only the affected patient requires follow-up, and a single screening echocardiogram is deemed sufficient in adult relatives (Erbel et al., 2014) unless the first test is performed before the age of 40 years, in which case a further test after the age of 40 years could be considered Aleene et al., 2018). In case the measured aortic diameter is borderline, a second echocardiography after 5 years appears wise."  There is also the likelihood of identifying a "Variant of unknown significance." This result means that the variant has not been detected in a statistically significant number of patients and/or functional studies have not been performed to verify its pathogenicity. This VUS can be tested in the family to see if it segregates with disease. If a VUS is found, first-degree relatives should get screened.  If a pathogenic variant is reported, then first-degree family members can get tested for this variant. If they test positive, it is likely they will develop an aortopathy. In light of variable expression and incomplete penetrance associated with this condition, it is not possible to predict when they will manifest clinically with an aneurysm. It is recommended that family members that test positive for the familial pathogenic variant pursue clinical screening by MRI or CT with reevaluation every 2-5 years.  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 There is no family  history of aneurysms or death from a dissected aortic aneurysm. 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  Genetic testing for the genes implicated in aortopathies is recommended. This test should include the major genes that contribute to MFS, LDS, vEDS and FTAAD. The genetic test will help confirm the diagnosis and identify the genetic basis of disease. In addition, confirming the diagnosis will allow appropriate and timely intervention as needed for the condition as guidelines for threshold for surgical intervention in patients with Mavis Pyo syndrome is lower (>4.110mm on TEE) than those with MFS (>4.5 -5.0 mm, based on risk factors).  Since this is an autosomal dominant condition, a positive test result will help identify first-degree family members that may harbor  the mutation and are at risk of developing this condition. Appropriate cardiology follow-up and lifestyle management can then be directed to those genotype-positive family members.   In addition, patient should be aware of protections afforded by the Genetic Information Non-Discrimination Act (GINA). GINA protects a patient from losing their employment or health insurance based on their genotype. However, these protections do not cover life insurance and disability. Explained to the patient that family members that are found to have the familial genetic mutation will be denied life insurance even if they are asymptomatic and do not exhibit clinical signs. He verbalized understanding.   Please note that the patient has not been counseled in this visit on personal, cultural. or ethical issues that they may face due to their heart condition.   Plan Umeka defers genetic testing as her insurance will not cover this test.    Danford Pac, Ph.D, [facility] Clinical Molecular Geneticist

## 2024-09-14 ENCOUNTER — Ambulatory Visit

## 2024-09-18 ENCOUNTER — Telehealth: Payer: Self-pay

## 2024-09-18 ENCOUNTER — Telehealth: Payer: Self-pay | Admitting: *Deleted

## 2024-09-18 NOTE — Telephone Encounter (Signed)
 Patient suppose to have court on 12/18, patient states that she needs medical letter saying that she has procedure on 12/17. Patient ask that we fax it to #[phone]. Please advise

## 2024-09-18 NOTE — Telephone Encounter (Signed)
 Right Heart Cath scheduled at [facility] for: Wednesday September 20, 2024 1:30 PM Arrival time [facility] Main Entrance A at: 11:30 AM  Diet: -May have light meal until 7:30 AM. (6 hours before procedure time) Approved light meal consists of plain toast, fruit, light soups, crackers.  Hydration: -May drink clear liquids until 2 hours before the procedure.  Approved liquids: Water , clear tea, black coffee, fruit juices-non-citric and without pulp,Gatorade, plain Jello/popsicles.  Medication instructions: -Hold:  Eliquis -none 09/18/24 until post procedure  -Other usual morning medications can be taken.  Plan to go home the same day, you will only stay overnight if medically necessary.  You must have responsible adult to drive you home.  Someone must be with you the first 24 hours after you arrive home.  Reviewed procedure instructions with patient.

## 2024-09-18 NOTE — Telephone Encounter (Signed)
 Please advise if okay with letter.

## 2024-09-19 ENCOUNTER — Encounter: Payer: Self-pay | Admitting: Emergency Medicine

## 2024-09-19 NOTE — Telephone Encounter (Signed)
 Pt returning call. Please advise.

## 2024-09-19 NOTE — Telephone Encounter (Signed)
 Spoke with patient. Scanned letter to [employer]. Success confirmed via fax machine.

## 2024-09-19 NOTE — Telephone Encounter (Signed)
 Per Dr. Ren: Genna with letter.   Spoke with patient to confirm fax number. Letter sent to [facility] [phone].

## 2024-09-19 NOTE — Telephone Encounter (Signed)
 Called patient back. Fax unsuccessful [phone]. Pt will return call for another fax number. Can speak with me.

## 2024-09-20 ENCOUNTER — Other Ambulatory Visit: Payer: Self-pay

## 2024-09-20 ENCOUNTER — Ambulatory Visit (HOSPITAL_COMMUNITY)
Admission: RE | Admit: 2024-09-20 | Discharge: 2024-09-20 | Disposition: A | Discharge status: Home / Self Care | Attending: Cardiology | Admitting: Cardiology

## 2024-09-20 ENCOUNTER — Encounter (HOSPITAL_COMMUNITY): Admission: RE | Disposition: A | Payer: Self-pay | Attending: Cardiology

## 2024-09-20 DIAGNOSIS — Z86718 Personal history of other venous thrombosis and embolism: Secondary | ICD-10-CM | POA: Diagnosis not present

## 2024-09-20 DIAGNOSIS — I272 Pulmonary hypertension, unspecified: Secondary | ICD-10-CM

## 2024-09-20 DIAGNOSIS — I119 Hypertensive heart disease without heart failure: Secondary | ICD-10-CM | POA: Insufficient documentation

## 2024-09-20 DIAGNOSIS — Z87891 Personal history of nicotine dependence: Secondary | ICD-10-CM | POA: Insufficient documentation

## 2024-09-20 DIAGNOSIS — E785 Hyperlipidemia, unspecified: Secondary | ICD-10-CM | POA: Diagnosis not present

## 2024-09-20 DIAGNOSIS — Z79899 Other long term (current) drug therapy: Secondary | ICD-10-CM | POA: Insufficient documentation

## 2024-09-20 DIAGNOSIS — Z7901 Long term (current) use of anticoagulants: Secondary | ICD-10-CM | POA: Diagnosis not present

## 2024-09-20 DIAGNOSIS — Z01812 Encounter for preprocedural laboratory examination: Secondary | ICD-10-CM

## 2024-09-20 HISTORY — PX: RIGHT HEART CATH: CATH118263

## 2024-09-20 LAB — POCT I-STAT EG7
Acid-Base Excess: 0 mmol/L (ref 0.0–2.0)
Acid-Base Excess: 1 mmol/L (ref 0.0–2.0)
Acid-Base Excess: 1 mmol/L (ref 0.0–2.0)
Acid-Base Excess: 1 mmol/L (ref 0.0–2.0)
Acid-Base Excess: 1 mmol/L (ref 0.0–2.0)
Bicarbonate: 24.7 mmol/L (ref 20.0–28.0)
Bicarbonate: 26.2 mmol/L (ref 20.0–28.0)
Bicarbonate: 26.3 mmol/L (ref 20.0–28.0)
Bicarbonate: 26.3 mmol/L (ref 20.0–28.0)
Bicarbonate: 26.4 mmol/L (ref 20.0–28.0)
Calcium, Ion: 1.19 mmol/L (ref 1.15–1.40)
Calcium, Ion: 1.22 mmol/L (ref 1.15–1.40)
Calcium, Ion: 1.22 mmol/L (ref 1.15–1.40)
Calcium, Ion: 1.23 mmol/L (ref 1.15–1.40)
Calcium, Ion: 1.23 mmol/L (ref 1.15–1.40)
HCT: 36 % (ref 36.0–46.0)
HCT: 36 % (ref 36.0–46.0)
HCT: 36 % (ref 36.0–46.0)
HCT: 37 % (ref 36.0–46.0)
HCT: 37 % (ref 36.0–46.0)
Hemoglobin: 12.2 g/dL (ref 12.0–15.0)
Hemoglobin: 12.2 g/dL (ref 12.0–15.0)
Hemoglobin: 12.2 g/dL (ref 12.0–15.0)
Hemoglobin: 12.6 g/dL (ref 12.0–15.0)
Hemoglobin: 12.6 g/dL (ref 12.0–15.0)
O2 Saturation: 72 %
O2 Saturation: 73 %
O2 Saturation: 75 %
O2 Saturation: 75 %
O2 Saturation: 85 %
Potassium: 3.8 mmol/L (ref 3.5–5.1)
Potassium: 3.8 mmol/L (ref 3.5–5.1)
Potassium: 3.9 mmol/L (ref 3.5–5.1)
Potassium: 3.9 mmol/L (ref 3.5–5.1)
Potassium: 3.9 mmol/L (ref 3.5–5.1)
Sodium: 141 mmol/L (ref 135–145)
Sodium: 142 mmol/L (ref 135–145)
Sodium: 142 mmol/L (ref 135–145)
Sodium: 142 mmol/L (ref 135–145)
Sodium: 142 mmol/L (ref 135–145)
TCO2: 26 mmol/L (ref 22–32)
TCO2: 28 mmol/L (ref 22–32)
TCO2: 28 mmol/L (ref 22–32)
TCO2: 28 mmol/L (ref 22–32)
TCO2: 28 mmol/L (ref 22–32)
pCO2, Ven: 38 mmHg — ABNORMAL LOW (ref 44–60)
pCO2, Ven: 44.3 mmHg (ref 44–60)
pCO2, Ven: 44.4 mmHg (ref 44–60)
pCO2, Ven: 44.8 mmHg (ref 44–60)
pCO2, Ven: 45.3 mmHg (ref 44–60)
pH, Ven: 7.374 (ref 7.25–7.43)
pH, Ven: 7.375 (ref 7.25–7.43)
pH, Ven: 7.38 (ref 7.25–7.43)
pH, Ven: 7.382 (ref 7.25–7.43)
pH, Ven: 7.42 (ref 7.25–7.43)
pO2, Ven: 39 mmHg (ref 32–45)
pO2, Ven: 40 mmHg (ref 32–45)
pO2, Ven: 41 mmHg (ref 32–45)
pO2, Ven: 42 mmHg (ref 32–45)
pO2, Ven: 49 mmHg — ABNORMAL HIGH (ref 32–45)

## 2024-09-20 SURGERY — RIGHT HEART CATH

## 2024-09-20 MED ORDER — SODIUM CHLORIDE 0.9% FLUSH: 3.0000 mL | INTRAVENOUS | Status: DC | PRN

## 2024-09-20 MED ORDER — LABETALOL HCL 5 MG/ML IV SOLN: 10.0000 mg | INTRAVENOUS | Status: DC | PRN

## 2024-09-20 MED ORDER — SODIUM CHLORIDE 0.9% FLUSH: 3.0000 mL | Freq: Two times a day (BID) | INTRAVENOUS | Status: DC

## 2024-09-20 MED ORDER — FREE WATER: 250.0000 mL | Freq: Once | Status: DC

## 2024-09-20 MED ORDER — HYDRALAZINE HCL 20 MG/ML IJ SOLN: 10.0000 mg | INTRAMUSCULAR | Status: DC | PRN

## 2024-09-20 MED ORDER — SODIUM CHLORIDE 0.9 % IV SOLN: 250.0000 mL | INTRAVENOUS | Status: DC | PRN

## 2024-09-20 MED ORDER — ONDANSETRON HCL 4 MG/2ML IJ SOLN: 4.0000 mg | Freq: Four times a day (QID) | INTRAMUSCULAR | Status: DC | PRN

## 2024-09-20 MED ORDER — LIDOCAINE HCL (PF) 1 % IJ SOLN
INTRAMUSCULAR | Status: AC
  Filled 2024-09-20: qty 30

## 2024-09-20 MED ORDER — HEPARIN (PORCINE) IN NACL 1000-0.9 UT/500ML-% IV SOLN
INTRAVENOUS | Status: DC | PRN
  Administered 2024-09-20: 14:00:00 500 mL

## 2024-09-20 MED ORDER — ACETAMINOPHEN 325 MG PO TABS: 650.0000 mg | ORAL_TABLET | ORAL | Status: DC | PRN

## 2024-09-20 MED ORDER — LIDOCAINE HCL (PF) 1 % IJ SOLN
INTRAMUSCULAR | Status: DC | PRN
  Administered 2024-09-20: 14:00:00 2 mL

## 2024-09-20 SURGICAL SUPPLY — 7 items
CATH BALLN WEDGE 5F 110CM (CATHETERS) IMPLANT
PACK CARDIAC CATHETERIZATION (CUSTOM PROCEDURE TRAY) ×1 IMPLANT
SHEATH GLIDE SLENDER 4/5FR (SHEATH) IMPLANT
SHEATH PROBE COVER 6X72 (BAG) IMPLANT
TRANSDUCER W/STOPCOCK (MISCELLANEOUS) IMPLANT
TUBING ART PRESS 72 MALE/FEM (TUBING) IMPLANT
WIRE MICROINTRODUCER 60CM (WIRE) IMPLANT

## 2024-09-20 NOTE — Interval H&P Note (Signed)
 History and Physical Interval Note:  09/20/2024 1:51 PM  [name]  has presented today for surgery, with the diagnosis of dilated right atrium.  The various methods of treatment have been discussed with the patient and family. After consideration of risks, benefits and other options for treatment, the patient has consented to  Procedures: RIGHT HEART CATH (N/A) as a surgical intervention.  The patient's history has been reviewed, patient examined, no change in status, stable for surgery.  I have reviewed the patient's chart and labs.  Questions were answered to the patient's satisfaction.     Jaleesa Cervi [employer]

## 2024-09-20 NOTE — Progress Notes (Addendum)
 Patient and patient daughter given discharge instructions, education provided no further questions at this time. Able to tolerate PO intake. Patient site is clean, dry, intact with no hematoma noted upon discharge. Verified with MD to restart Eliquis  tonight.

## 2024-09-20 NOTE — Discharge Instructions (Signed)

## 2024-09-21 ENCOUNTER — Encounter (HOSPITAL_COMMUNITY): Payer: Self-pay | Admitting: Cardiology

## 2024-09-21 NOTE — Telephone Encounter (Signed)
 Spoke with patient regarding letter sent to magistrate office yesterday pertaining her having a heart cath yesterday 09/20/2024. Pt asking to resend letter. Re-scanned to email given. Success status given from scanner.  Pt verbalized understanding.

## 2024-09-21 NOTE — Telephone Encounter (Signed)
 Pt requesting a c/b from the nurse

## 2024-10-12 ENCOUNTER — Ambulatory Visit

## 2024-10-12 ENCOUNTER — Other Ambulatory Visit: Payer: Self-pay

## 2024-10-12 DIAGNOSIS — Z95828 Presence of other vascular implants and grafts: Secondary | ICD-10-CM

## 2024-10-12 DIAGNOSIS — I7101 Dissection of ascending aorta: Secondary | ICD-10-CM

## 2024-10-12 DIAGNOSIS — R079 Chest pain, unspecified: Secondary | ICD-10-CM

## 2024-10-12 DIAGNOSIS — Z720 Tobacco use: Secondary | ICD-10-CM

## 2024-10-12 DIAGNOSIS — R030 Elevated blood-pressure reading, without diagnosis of hypertension: Secondary | ICD-10-CM

## 2024-10-12 DIAGNOSIS — I10 Essential (primary) hypertension: Secondary | ICD-10-CM

## 2024-10-12 DIAGNOSIS — R200 Anesthesia of skin: Secondary | ICD-10-CM

## 2024-10-16 ENCOUNTER — Ambulatory Visit

## 2024-10-16 DIAGNOSIS — Z95828 Presence of other vascular implants and grafts: Secondary | ICD-10-CM

## 2024-10-16 DIAGNOSIS — I7101 Dissection of ascending aorta: Secondary | ICD-10-CM

## 2024-10-16 DIAGNOSIS — I10 Essential (primary) hypertension: Secondary | ICD-10-CM

## 2024-10-16 DIAGNOSIS — R030 Elevated blood-pressure reading, without diagnosis of hypertension: Secondary | ICD-10-CM

## 2024-10-16 NOTE — Progress Notes (Unsigned)
 24 hour ambulatory blood pressure monitor applied to patients left arm using standard adult cuff.

## 2024-10-26 DIAGNOSIS — I7101 Dissection of ascending aorta: Secondary | ICD-10-CM | POA: Diagnosis not present

## 2024-10-26 DIAGNOSIS — R030 Elevated blood-pressure reading, without diagnosis of hypertension: Secondary | ICD-10-CM | POA: Diagnosis not present

## 2024-10-26 DIAGNOSIS — Z95828 Presence of other vascular implants and grafts: Secondary | ICD-10-CM | POA: Diagnosis not present

## 2024-10-26 DIAGNOSIS — I10 Essential (primary) hypertension: Secondary | ICD-10-CM | POA: Diagnosis not present

## 2024-10-27 ENCOUNTER — Ambulatory Visit: Payer: Self-pay

## 2024-12-14 ENCOUNTER — Ambulatory Visit
